# Patient Record
Sex: Female | Born: 1935 | Race: White | Hispanic: No | Smoking: Never smoker
Health system: Southern US, Community
[De-identification: ages and names within clinical notes are randomized; demographics above are authoritative.]

## PROBLEM LIST (undated history)

## (undated) DIAGNOSIS — G4700 Insomnia, unspecified: Secondary | ICD-10-CM

## (undated) DIAGNOSIS — R42 Dizziness and giddiness: Secondary | ICD-10-CM

## (undated) DIAGNOSIS — Z8619 Personal history of other infectious and parasitic diseases: Secondary | ICD-10-CM

## (undated) DIAGNOSIS — F418 Other specified anxiety disorders: Secondary | ICD-10-CM

## (undated) DIAGNOSIS — N200 Calculus of kidney: Secondary | ICD-10-CM

## (undated) DIAGNOSIS — N189 Chronic kidney disease, unspecified: Secondary | ICD-10-CM

## (undated) DIAGNOSIS — R0600 Dyspnea, unspecified: Secondary | ICD-10-CM

## (undated) DIAGNOSIS — I700 Atherosclerosis of aorta: Secondary | ICD-10-CM

## (undated) DIAGNOSIS — I639 Cerebral infarction, unspecified: Secondary | ICD-10-CM

## (undated) DIAGNOSIS — R110 Nausea: Principal | ICD-10-CM

## (undated) DIAGNOSIS — M4800 Spinal stenosis, site unspecified: Secondary | ICD-10-CM

## (undated) DIAGNOSIS — N6019 Diffuse cystic mastopathy of unspecified breast: Principal | ICD-10-CM

## (undated) DIAGNOSIS — R413 Other amnesia: Secondary | ICD-10-CM

## (undated) DIAGNOSIS — I10 Essential (primary) hypertension: Secondary | ICD-10-CM

## (undated) DIAGNOSIS — E079 Disorder of thyroid, unspecified: Secondary | ICD-10-CM

## (undated) DIAGNOSIS — R350 Frequency of micturition: Secondary | ICD-10-CM

## (undated) HISTORY — DX: Calculus of kidney: N20.0

## (undated) HISTORY — DX: Cerebral infarction, unspecified: I63.9

## (undated) HISTORY — DX: Chronic kidney disease, unspecified: N18.9

## (undated) HISTORY — DX: Other specified anxiety disorders: F41.8

## (undated) HISTORY — DX: Essential (primary) hypertension: I10

## (undated) HISTORY — DX: Nausea: R11.0

## (undated) HISTORY — DX: Frequency of micturition: R35.0

## (undated) HISTORY — DX: Insomnia, unspecified: G47.00

## (undated) HISTORY — DX: Disorder of thyroid, unspecified: E07.9

## (undated) HISTORY — DX: Personal history of other infectious and parasitic diseases: Z86.19

## (undated) HISTORY — DX: Dizziness and giddiness: R42

## (undated) HISTORY — DX: Other amnesia: R41.3

## (undated) HISTORY — DX: Spinal stenosis, site unspecified: M48.00

## (undated) HISTORY — DX: Diffuse cystic mastopathy of unspecified breast: N60.19

---

## 1961-05-30 HISTORY — PX: ABDOMINAL HYSTERECTOMY: SHX81

## 1968-05-30 HISTORY — PX: BREAST SURGERY: SHX581

## 1987-05-31 HISTORY — PX: BACK SURGERY: SHX140

## 1996-05-30 DIAGNOSIS — N189 Chronic kidney disease, unspecified: Secondary | ICD-10-CM

## 1996-05-30 HISTORY — DX: Chronic kidney disease, unspecified: N18.9

## 2007-04-30 DIAGNOSIS — I639 Cerebral infarction, unspecified: Secondary | ICD-10-CM

## 2007-04-30 HISTORY — DX: Cerebral infarction, unspecified: I63.9

## 2007-09-18 ENCOUNTER — Encounter: Admission: RE | Admit: 2007-09-18 | Discharge: 2007-09-29 | Payer: Self-pay | Admitting: Neurology

## 2007-09-30 ENCOUNTER — Encounter: Admission: RE | Admit: 2007-09-30 | Discharge: 2007-12-29 | Payer: Self-pay | Admitting: Neurology

## 2007-12-09 ENCOUNTER — Inpatient Hospital Stay (HOSPITAL_COMMUNITY): Admission: EM | Admit: 2007-12-09 | Discharge: 2007-12-11 | Payer: Self-pay | Admitting: Emergency Medicine

## 2007-12-17 ENCOUNTER — Emergency Department (HOSPITAL_COMMUNITY): Admission: EM | Admit: 2007-12-17 | Discharge: 2007-12-17 | Payer: Self-pay | Admitting: Emergency Medicine

## 2008-06-11 ENCOUNTER — Encounter: Admission: RE | Admit: 2008-06-11 | Discharge: 2008-07-03 | Payer: Self-pay | Admitting: Neurosurgery

## 2008-10-14 ENCOUNTER — Emergency Department (HOSPITAL_COMMUNITY): Admission: EM | Admit: 2008-10-14 | Discharge: 2008-10-14 | Payer: Self-pay | Admitting: Emergency Medicine

## 2009-10-06 ENCOUNTER — Emergency Department (HOSPITAL_COMMUNITY): Admission: EM | Admit: 2009-10-06 | Discharge: 2009-10-06 | Payer: Self-pay | Admitting: Emergency Medicine

## 2010-09-07 LAB — POCT I-STAT, CHEM 8
Chloride: 100 mEq/L (ref 96–112)
Creatinine, Ser: 0.9 mg/dL (ref 0.4–1.2)
Glucose, Bld: 103 mg/dL — ABNORMAL HIGH (ref 70–99)
HCT: 41 % (ref 36.0–46.0)
Potassium: 3.4 mEq/L — ABNORMAL LOW (ref 3.5–5.1)

## 2010-09-07 LAB — URINALYSIS, ROUTINE W REFLEX MICROSCOPIC
Bilirubin Urine: NEGATIVE
Hgb urine dipstick: NEGATIVE
Specific Gravity, Urine: 1.015 (ref 1.005–1.030)
pH: 8 (ref 5.0–8.0)

## 2010-09-07 LAB — CBC
RBC: 4.03 MIL/uL (ref 3.87–5.11)
WBC: 6.1 10*3/uL (ref 4.0–10.5)

## 2010-09-07 LAB — DIFFERENTIAL
Lymphs Abs: 1.4 10*3/uL (ref 0.7–4.0)
Monocytes Relative: 7 % (ref 3–12)
Neutro Abs: 4.3 10*3/uL (ref 1.7–7.7)
Neutrophils Relative %: 70 % (ref 43–77)

## 2010-09-07 LAB — URINE MICROSCOPIC-ADD ON

## 2010-10-12 NOTE — H&P (Signed)
NAMEKISMET, FACEMIRE NO.:  [id number]   MEDICAL RECORD NO.:  [id number]          PATIENT TYPE:  INP   LOCATION:  3036                         FACILITY:  [facility]   PHYSICIAN:  Darnelle Bos, MDDATE OF BIRTH:  August 13, 1926   DATE OF ADMISSION:  12/09/2007  DATE OF DISCHARGE:                              HISTORY & PHYSICAL   REASONS FOR ADMISSION:  Vertigo and generalized weakness.   HISTORY OF PRESENT ILLNESS:  Ms. Goldberger is a 77 year old right-handed  female who came into the hospital as a code stroke.  The code stroke was  activated at 8:41 a.m. on December 09, 2007 with a report of unsteady gait  and leg weakness that started at 7 a.m., but on further questioning the  patient reported last normalcy at 5 a.m.   The patient reports that she woke up at 5 a.m. to take her  antihypertensive and thyroid medications and went back to bed.  In  between 6:30 and 7 when she woke up to get ready for church, she felt  dizziness that generalized to weakness and heaviness in the arms and  tingling in the lower extremities.  She went back to bed.  She also  reports nausea without any vomiting.  She denies any headache or visual  changes.  Denies any facial numbness or paresthesias.  Denies any  difficulty with speech, chewing, or swallowing.  She denies any bowel or  bladder problems.  Denies any chest pain or shortness of breath.  Since  she was not feeling well, she called her son who asked her to call 911.  By the EMS arrived to evaluate her, they thought she had bilateral arm  drift without any facial droop or speech difficulties.  Reportedly, the  legs were okay other than some paresthesias.  Her blood pressure was  160/90 and her blood glucose was in the range of the 90s.  She was  brought into the emergency room at which time her symptoms totally  resolved.   Code stroke was cancelled as the patient did not have any focal weakness  and was just having vertigo.   By the time neurology evaluated the patient and said she had some mild  paresthesias in the feet and was lightheaded without any vertigo or any  weakness.   On further questioning, the patient reports that she had similar episode  in December 2008, but little increase in severity where she thought her  right side was weaker.  She was not evaluated immediately but went to  her physician in a couple of days.  She was evaluated by neurology and  Dr. Vickey Huger with all the testing as per report including MRI, cardiac  evaluation, and carotid studies which were reportedly normal.  These  were done at an outside place and we do not have access to these through  the hospital records at this time.   PAST MEDICAL HISTORY:  1. Hypertension.  2. Hypothyroidism.  3. Prior stroke.   PAST SURGICAL HISTORY:  Nonsignificant.   SOCIAL HISTORY:  The patient lives by herself and recently moved in  October 2008 from [place].  She does not smoke.  She drinks a  glass of wine every night as per physician recommendation.  She denies  any illicit drugs.  She walks by herself at the home but uses a cane  when she goes outside.   FAMILY HISTORY:  No history of CVA.  No other significant family history  of diabetes or hypertension or coronary artery disease.   REVIEW OF SYSTEMS:  All systems were reviewed.  The pertinent positives  are mentioned in history and physical.  She denies any recent  infections.  Denies any recent change in appetite or weight.  She denies  any recent trauma.   PHYSICAL EXAMINATION:  VITAL SIGNS:  Her blood pressure was 160/90, her  pulse rate was 70, respirations 16, she was afebrile.  Saturating 95%-  100% on 2 L.  GENERAL:  Ms. Wampole is a pleasant female in no acute distress.  HEENT:  Normocephalic, atraumatic.  NECK:  Supple, no carotid bruits, no thyromegaly.  CARDIOVASCULAR:  Regular rhythmic rate.  LUNGS:  Clear to air entry.  EXTREMITIES:  No cyanosis,  clubbing, or edema.  NEUROLOGIC:  The patient is awake, alert, oriented to person, place and  time.  No dysarthria or aphasia.  She could read and name and repeat  without any problems.  CRANIAL NERVES:  II-XII were intact.  Pupils were symmetric, reactive to  light and accommodation.  Extraocular movements were intact other than  mild end-gaze nystagmus.  Visual fields were intact to confrontation.  No neglect.  No facial asymmetry.  Tongue was midline without any  weakness or atrophy.  Palate elevation was symmetric.  MOTOR EVALUATION:  Normal bulk and tone for age.  Strength was greater  than 5/5 proximally and distally, other than shakiness while evaluation.  Deep tendon reflexes were 2+ and symmetric.  Plantars were downgoing.  Sensory evaluation reveals normal pinprick, position, and touch even to  simultaneous stimulations.  Vibration sensation was absent at the toes  with mildly reduced to the ankles and normal in the upper extremities.  Normal finger-nose testing and heel testing.  She appeared slightly slow  in rapid alternating movements bilaterally.  Gait was not evaluated at  this time.   LABORATORY EVALUATIONS:  Her CBC was normal with a white count of 6.6,  hemoglobin 14.6, hematocrit 44.1, and platelets 239.  Her coag profile  and cardiac evaluation is pending.  EKG shows some nonspecific ST  changes, but is in sinus rhythm.  A CT of the head was reviewed.  There  were no acute abnormalities, other than atrophy and some periventricular  white matter changes.   IMPRESSION:  Transient vertigo with generalized weakness.  At this time,  this is possibly a peripheral etiology but cannot rule out a central  vertigo.  Her symptoms are totally resolved and her NIH stroke scale  currently is 0.  Her CT of the head is negative.  Due to her similar  episode in December with some right-sided weakness and the repeat  episode now, the plan is:  1. Admit her for observation under  telemetry.  2. We will get records of all the recent evaluations as reports      getting whole stroke workup done before we order any testing like      MRI or MRA.  3. We will hydrate her and get physical therapy to evaluate her.  4. We will monitor blood pressure to maintain it less than  180/100 and      use p.r.n. labetalol.  5. Low-salt, low-cholesterol diet after swallow evaluation.  6. We will get cardiac enzymes and do them every 6 hours.  7. We will get thyroid profile, hemoglobin A1c, homocysteine, and      fasting lipid profile and coag studies.  8. We will check her UA.  9. We will hydrate her with 80 mL of normal saline.   The patient was explained the plan and is in agreement with it.  We will  have records from Dr. Oliva Bustard office on Monday morning before  finalizing any further plans.      Darnelle Bos, MD  Electronically Signed     RB/MEDQ  D:  12/09/2007  T:  12/09/2007  Job:  [id number]

## 2010-10-12 NOTE — Discharge Summary (Signed)
NAME:  Darlene Wilson, Darlene Wilson NO.:  [id number]   MEDICAL RECORD NO.:  [id number]          PATIENT TYPE:  INP   LOCATION:  3036                         FACILITY:  [facility]   PHYSICIAN:  Pramod P. Pearlean Brownie, MD    DATE OF BIRTH:  24-Jun-1926   DATE OF ADMISSION:  12/09/2007  DATE OF DISCHARGE:  12/11/2007                               DISCHARGE SUMMARY   DISCHARGE DIAGNOSES:  1. Brainstem Transient ischemic attack.  2. Hypertension.  3. Hyperthyroidism.  4. History of previous stroke.   DISCHARGE MEDICINES:  1. Hydrochlorothiazide 12.5 mg a day.  2. Lisinopril 10 mg a day.  3. Aspirin 81 mg q.a.m. x2 weeks, then stop.  4. Synthroid 75 mcg a day.  5. Vitamin C 1000 mg a day.  6. Omega-3 fish oil a day.  7. B12 1000 mg a day.  8. Beta-carotene 10,000 units a day.  9. Aggrenox 1 nightly, x2 weeks then increased to b.i.d.   STUDIES PERFORMED:  1. CT of the brain on admission shows atrophy and chronic      microvascular ischemia.  No acute abnormality.  2. MRI of the brain shows no acute stroke.  3. MRA of the brain shows no significant stenosis.  4. Carotid Doppler, not ordered.  5. Transcranial Doppler, not ordered.  6. 2-D echocardiogram, not ordered.  7. EKG, not present in chart.   LABORATORY STUDIES:  CBC normal.  Chemistry with glucose 193, otherwise  normal coags; normal liver function tests; and normal albumin 3.4.  Cardiac enzymes negative x2.  Cholesterol 109, triglycerides 61, HDL 33,  and LDL 64.  Urinalysis is negative.  Homocystine is 8.4, TSH is 1.801,  and hemoglobin A1c is 5.5.   HISTORY OF PRESENT ILLNESS:  Darlene Wilson is a 77 year old right-  handed Caucasian female who came to the hospital as a code stroke.  She  reported unsteady gait and leg weakness that started at 7 a.m., but on  further questioning, the patient reported last normal at 5 a.m.  The  patient reports she woke at 5 a.m. to take her anti-hypertensive and  thyroid medications and  went back to bed.  Between 6:30 and 7, when she  woke up to get ready for church, she felt dizzy with generalized  weakness and heaviness in the arms and tingling in the lower  extremities.  She went back to bed.  She also reports nausea without  vomiting.  She denies headache or visual changes, numbness or  paresthesias, any difficulty with speech, chewing, or swallowing.  She  denies bowel or bladder problems.  She states she was not feeling well.  She called her son who asked her to call 911.  As time EMS arrived to  evaluate her, they thought she had bilateral arm drift without any  facial droop or speech difficulties.  Reportedly, the legs were okay  other than some paresthesias.  A blood pressure was 160/90 and her CBG  was 90.  She was brought to the emergency room where her symptoms  resolved.  The code stroke was cancelled as she  did not have any focal  neurologic deficits.  She was not a TPA candidate secondary to  resolution.   The patient reports, she had similar episode in December 2008, but  little increase in severity where she thought her right side was weaker.  She was not evaluated immediately, but went to her physician in a couple  of days.  She was evaluated by neurology at that time and all the  testing was normal.   HOSPITAL COURSE:  MRI again was negative for acute stroke.  I felt the  patient most likely had a brainstem TIA.  As she had a brainstem TIA on  aspirin, she was changed to Aggrenox.  We will bridge with aspirin for 2  weeks and then go to Aggrenox b.i.d. for headache prevention.  In the  hospital, she was evaluated by PT and OT and felt she would benefit from  a home safety eval and that has been ordered for followup.  Otherwise,  her risk factors are stable.  She has been asked to follow up with Dr.  Pearlean Brownie in 2 months and will consider the IRS trial at that time.   CONDITION ON DISCHARGE:  Alter and oriented x3.  No aphasia.  No  dysarthria.   Cranial nerves were normal and she has no focal neurologic  deficits.   DISCHARGE PLAN:  1. Discharge home with family.  2. Followup with Dr. Delia Heady in 2 months.  3. Followup with the primary care physician in 1 month.  May ask him      about the shingles shot.  4. Consider IRS trial.  5. Aggrenox for stroke prevention.  6. Home Health PT and OT safety evaluation along with rolling walker      with 5-inch wheel for home use.      Annie Main, N.P.    ______________________________  Sunny Schlein. Pearlean Brownie, MD    SB/MEDQ  D:  12/11/2007  T:  12/12/2007  Job:  [id number]   cc:   Pramod P. Pearlean Brownie, MD  [facility]

## 2011-01-12 ENCOUNTER — Encounter: Payer: Self-pay | Admitting: Family Medicine

## 2011-01-12 ENCOUNTER — Ambulatory Visit (INDEPENDENT_AMBULATORY_CARE_PROVIDER_SITE_OTHER): Payer: Medicare Other | Admitting: Family Medicine

## 2011-01-12 DIAGNOSIS — N189 Chronic kidney disease, unspecified: Secondary | ICD-10-CM

## 2011-01-12 DIAGNOSIS — I6350 Cerebral infarction due to unspecified occlusion or stenosis of unspecified cerebral artery: Secondary | ICD-10-CM

## 2011-01-12 DIAGNOSIS — E079 Disorder of thyroid, unspecified: Secondary | ICD-10-CM | POA: Insufficient documentation

## 2011-01-12 DIAGNOSIS — N200 Calculus of kidney: Secondary | ICD-10-CM | POA: Insufficient documentation

## 2011-01-12 DIAGNOSIS — I10 Essential (primary) hypertension: Secondary | ICD-10-CM | POA: Insufficient documentation

## 2011-01-12 DIAGNOSIS — G4700 Insomnia, unspecified: Secondary | ICD-10-CM

## 2011-01-12 DIAGNOSIS — I639 Cerebral infarction, unspecified: Secondary | ICD-10-CM

## 2011-01-12 DIAGNOSIS — E039 Hypothyroidism, unspecified: Secondary | ICD-10-CM

## 2011-01-12 DIAGNOSIS — N6019 Diffuse cystic mastopathy of unspecified breast: Secondary | ICD-10-CM

## 2011-01-12 HISTORY — DX: Diffuse cystic mastopathy of unspecified breast: N60.19

## 2011-01-12 MED ORDER — HYDROCHLOROTHIAZIDE 12.5 MG PO CAPS: 12.5000 mg | ORAL_CAPSULE | ORAL | Status: DC

## 2011-01-12 MED ORDER — LEVOTHYROXINE SODIUM 75 MCG PO TABS: 75.0000 ug | ORAL_TABLET | Freq: Every day | ORAL | Status: DC

## 2011-01-12 NOTE — Patient Instructions (Signed)

## 2011-01-13 ENCOUNTER — Encounter: Payer: Self-pay | Admitting: Family Medicine

## 2011-01-13 DIAGNOSIS — G4700 Insomnia, unspecified: Secondary | ICD-10-CM

## 2011-01-13 HISTORY — DX: Insomnia, unspecified: G47.00

## 2011-01-13 NOTE — Assessment & Plan Note (Signed)
Patient denies any concerns at today's visit and continues with annual [facility], believes her next one is due in September but they cannot remember where she gets hers, they will make sure we get a copy when they schedule her appt. They have info at home

## 2011-01-13 NOTE — Assessment & Plan Note (Signed)
Well treated no change to Levothyroxine dose, refill given today

## 2011-01-13 NOTE — Assessment & Plan Note (Signed)
She has just seen her neurologist and been started on Gabapentin, takes the first dose tonight for some right sided temporal pains they think are neuropathic, they are intermittent, hit suddenly, are electrical and stabbing. Tonight she will start 100mg  qhs and already knows to watch out for SE and call neuro if any concerns.

## 2011-01-13 NOTE — Assessment & Plan Note (Signed)
No recent kidney stones, maintain adequate hydration and check a renal panel at next visit

## 2011-01-13 NOTE — Assessment & Plan Note (Signed)
Patient has a long history of trouble maintain ing sleep. She is up and down all night. Gabapentin may be helpful in this regard, we will have her back to reassess next month after she has her annual blood work done. Likely this is a major contributing factor to her fatigue as well.

## 2011-01-13 NOTE — Assessment & Plan Note (Signed)
Well controlled on current low dose of HCTZ, given refill today and old records are requested to assess time line of disease

## 2011-01-13 NOTE — Progress Notes (Signed)
Darlene Wilson [id number] 1927/04/09 01/13/2011      Progress Note New Patient  Subjective  Chief Complaint  Chief Complaint  Patient presents with  . Establish Care    TSH check    HPI  Patient is a 77 yo Caucasian female in today to establish care. She is accompanied by her daughter-in-law and has a history stroke, HTN, insomnia and is in need of a new PMD. She has  Along history of insomnia plagues her but she has just been to see her Neurologist and they have given her a prescription for Gabapentin to help with some right sided HA that have been occuring since her strokes 4 years ago. They are stabbing and they believe it is nerve damage. She is due to start the Gabapentin 100mg  tonight. No recent acute illness, fevers, chills, CP, palp, SOB, GI or GU c/o.    Past Medical History  Diagnosis Date  . History of chicken pox   . Glaucoma     both eyes  . Hypertension   . Chronic kidney disease 1998    stones  . Stroke 04/2007    x 3 total  . Thyroid disease     hypothyroid  . Fibrocystic breast 01/12/2011  . Insomnia 01/13/2011    Past Surgical History  Procedure Date  . Abdominal hysterectomy 1963  . Back surgery 1989    discectomy L4-5 1989, good results  . Breast surgery 1970    biopsy, benign, b/l    Family History  Problem Relation Age of Onset  . Heart disease Mother   . Hypertension Mother   . Diabetes Mother   . Heart disease Father   . Hypertension Father   . Cancer Sister     breast cancer    History   Social History  . Marital Status: Widowed    Spouse Name: N/A    Number of Children: 2  . Years of Education: N/A   Occupational History  . Not on file.   Social History Main Topics  . Smoking status: Never Smoker   . Smokeless tobacco: Never Used  . Alcohol Use: 1.8 oz/week    3 Glasses of wine per week  . Drug Use: No  . Sexually Active: Not Currently   Other Topics Concern  . Not on file   Social History Narrative  . No narrative  on file    No current outpatient prescriptions on file prior to visit.    Allergies  Allergen Reactions  . Ciprofloxacin Hives    Given with 2 other meds/unsure if truly allergic  . Combigan (Brimonidine Tartrate-Timolol) Other (See Comments)    Inflamation  . Detrol (Tolterodine Tartrate) Hives    Given with 2 other medications/unsure if truly allergic  . Oxycodone Hives    Given with 2 other medications/unsure if truly allergic  . Penicillins   . Sulfa Antibiotics     Review of Systems  Review of Systems  Constitutional: Positive for malaise/fatigue. Negative for fever and chills.  HENT: Negative for hearing loss, nosebleeds and congestion.   Eyes: Negative for discharge.  Respiratory: Negative for cough, sputum production, shortness of breath and wheezing.   Cardiovascular: Negative for chest pain, palpitations and leg swelling.  Gastrointestinal: Negative for heartburn, nausea, vomiting, abdominal pain, diarrhea, constipation and blood in stool.  Genitourinary: Negative for dysuria, urgency, frequency and hematuria.  Musculoskeletal: Negative for myalgias, back pain and falls.  Skin: Negative for rash.  Neurological: Positive for focal weakness  and headaches. Negative for dizziness, tremors, sensory change, loss of consciousness and weakness.       Right lower extremity  Endo/Heme/Allergies: Negative for polydipsia. Does not bruise/bleed easily.  Psychiatric/Behavioral: Negative for depression and suicidal ideas. The patient is not nervous/anxious and does not have insomnia.     Objective  BP 128/78  Pulse 71  Temp(Src) 98.3 F (36.8 C) (Oral)  Wt 130 lb (58.968 kg)  SpO2 97%  Physical Exam  Physical Exam  Constitutional: She is oriented to person, place, and time and well-developed, well-nourished, and in no distress. No distress.  HENT:  Head: Normocephalic and atraumatic.  Right Ear: External ear normal.  Left Ear: External ear normal.  Nose: Nose normal.   Mouth/Throat: Oropharynx is clear and moist. No oropharyngeal exudate.  Eyes: Conjunctivae are normal. Pupils are equal, round, and reactive to light. Right eye exhibits no discharge. Left eye exhibits no discharge. No scleral icterus.  Neck: Normal range of motion. Neck supple. No thyromegaly present.  Cardiovascular: Normal rate, regular rhythm, normal heart sounds and intact distal pulses.   No murmur heard. Pulmonary/Chest: Effort normal and breath sounds normal. No respiratory distress. She has no wheezes. She has no rales.  Abdominal: Soft. Bowel sounds are normal. She exhibits no distension and no mass. There is no tenderness.  Musculoskeletal: Normal range of motion. She exhibits no edema and no tenderness.  Lymphadenopathy:    She has no cervical adenopathy.  Neurological: She is alert and oriented to person, place, and time. She has normal reflexes. No cranial nerve deficit. Coordination abnormal.       4+/5 strength in rle, 5/5 of strength lle  Skin: Skin is warm and dry. No rash noted. She is not diaphoretic.  Psychiatric: Mood, memory and affect normal.       Assessment & Plan  Stroke She has just seen her neurologist and been started on Gabapentin, takes the first dose tonight for some right sided temporal pains they think are neuropathic, they are intermittent, hit suddenly, are electrical and stabbing. Tonight she will start 100mg  qhs and already knows to watch out for SE and call neuro if any concerns.  Fibrocystic breast Patient denies any concerns at today's visit and continues with annual [facility], believes her next one is due in September but they cannot remember where she gets hers, they will make sure we get a copy when they schedule her appt. They have info at home  Hypertension Well controlled on current low dose of HCTZ, given refill today and old records are requested to assess time line of disease  Thyroid disease Well treated no change to Levothyroxine dose,  refill given today  Chronic kidney disease No recent kidney stones, maintain adequate hydration and check a renal panel at next visit  Insomnia Patient has a long history of trouble maintain ing sleep. She is up and down all night. Gabapentin may be helpful in this regard, we will have her back to reassess next month after she has her annual blood work done. Likely this is a major contributing factor to her fatigue as well.

## 2011-01-16 ENCOUNTER — Encounter: Payer: Self-pay | Admitting: Family Medicine

## 2011-01-30 ENCOUNTER — Emergency Department (HOSPITAL_COMMUNITY): Payer: Medicare Other

## 2011-01-30 ENCOUNTER — Observation Stay (HOSPITAL_COMMUNITY)
Admission: EM | Admit: 2011-01-30 | Discharge: 2011-02-01 | Disposition: A | Discharge status: Home / Self Care | Payer: Medicare Other | Attending: Internal Medicine | Admitting: Internal Medicine

## 2011-01-30 DIAGNOSIS — R5381 Other malaise: Secondary | ICD-10-CM | POA: Insufficient documentation

## 2011-01-30 DIAGNOSIS — R51 Headache: Secondary | ICD-10-CM | POA: Insufficient documentation

## 2011-01-30 DIAGNOSIS — I059 Rheumatic mitral valve disease, unspecified: Secondary | ICD-10-CM | POA: Insufficient documentation

## 2011-01-30 DIAGNOSIS — Z79899 Other long term (current) drug therapy: Secondary | ICD-10-CM | POA: Insufficient documentation

## 2011-01-30 DIAGNOSIS — I10 Essential (primary) hypertension: Secondary | ICD-10-CM | POA: Insufficient documentation

## 2011-01-30 DIAGNOSIS — E876 Hypokalemia: Secondary | ICD-10-CM | POA: Insufficient documentation

## 2011-01-30 DIAGNOSIS — R55 Syncope and collapse: Secondary | ICD-10-CM | POA: Insufficient documentation

## 2011-01-30 DIAGNOSIS — G459 Transient cerebral ischemic attack, unspecified: Principal | ICD-10-CM | POA: Insufficient documentation

## 2011-01-30 DIAGNOSIS — Z8673 Personal history of transient ischemic attack (TIA), and cerebral infarction without residual deficits: Secondary | ICD-10-CM | POA: Insufficient documentation

## 2011-01-30 DIAGNOSIS — R42 Dizziness and giddiness: Secondary | ICD-10-CM | POA: Insufficient documentation

## 2011-01-30 DIAGNOSIS — E871 Hypo-osmolality and hyponatremia: Secondary | ICD-10-CM | POA: Insufficient documentation

## 2011-01-30 DIAGNOSIS — F411 Generalized anxiety disorder: Secondary | ICD-10-CM | POA: Insufficient documentation

## 2011-01-30 DIAGNOSIS — R072 Precordial pain: Secondary | ICD-10-CM | POA: Insufficient documentation

## 2011-01-30 DIAGNOSIS — F29 Unspecified psychosis not due to a substance or known physiological condition: Secondary | ICD-10-CM | POA: Insufficient documentation

## 2011-01-30 DIAGNOSIS — E039 Hypothyroidism, unspecified: Secondary | ICD-10-CM | POA: Insufficient documentation

## 2011-01-30 DIAGNOSIS — I679 Cerebrovascular disease, unspecified: Secondary | ICD-10-CM | POA: Insufficient documentation

## 2011-01-30 DIAGNOSIS — I7389 Other specified peripheral vascular diseases: Secondary | ICD-10-CM | POA: Insufficient documentation

## 2011-01-30 DIAGNOSIS — H538 Other visual disturbances: Secondary | ICD-10-CM | POA: Insufficient documentation

## 2011-01-30 LAB — COMPREHENSIVE METABOLIC PANEL
AST: 16 U/L (ref 0–37)
Albumin: 3.3 g/dL — ABNORMAL LOW (ref 3.5–5.2)
Albumin: 3.3 g/dL — ABNORMAL LOW (ref 3.5–5.2)
Alkaline Phosphatase: 83 U/L (ref 39–117)
BUN: 14 mg/dL (ref 6–23)
BUN: 11 mg/dL (ref 6–23)
Calcium: 9.3 mg/dL (ref 8.4–10.5)
Chloride: 96 mEq/L (ref 96–112)
Creatinine, Ser: 0.71 mg/dL (ref 0.50–1.10)
Potassium: 3 mEq/L — ABNORMAL LOW (ref 3.5–5.1)
Total Bilirubin: 0.4 mg/dL (ref 0.3–1.2)
Total Protein: 6.2 g/dL (ref 6.0–8.3)

## 2011-01-30 LAB — MAGNESIUM: Magnesium: 2.1 mg/dL (ref 1.5–2.5)

## 2011-01-30 LAB — URINALYSIS, ROUTINE W REFLEX MICROSCOPIC
Bilirubin Urine: NEGATIVE
Leukocytes, UA: NEGATIVE
Nitrite: POSITIVE — AB
Specific Gravity, Urine: 1.014 (ref 1.005–1.030)
pH: 7.5 (ref 5.0–8.0)

## 2011-01-30 LAB — DIFFERENTIAL
Basophils Absolute: 0 10*3/uL (ref 0.0–0.1)
Eosinophils Absolute: 0 10*3/uL (ref 0.0–0.7)
Lymphocytes Relative: 21 % (ref 12–46)
Lymphs Abs: 1.3 10*3/uL (ref 0.7–4.0)
Neutrophils Relative %: 67 % (ref 43–77)

## 2011-01-30 LAB — CBC
HCT: 35.3 % — ABNORMAL LOW (ref 36.0–46.0)
Hemoglobin: 11.4 g/dL — ABNORMAL LOW (ref 12.0–15.0)
MCV: 84.6 fL (ref 78.0–100.0)
Platelets: 301 10*3/uL (ref 150–400)
RBC: 4.21 MIL/uL (ref 3.87–5.11)
RBC: 4.17 MIL/uL (ref 3.87–5.11)
RDW: 14.5 % (ref 11.5–15.5)
WBC: 6.1 10*3/uL (ref 4.0–10.5)
WBC: 6.9 10*3/uL (ref 4.0–10.5)

## 2011-01-30 LAB — POCT I-STAT TROPONIN I: Troponin i, poc: 0 ng/mL (ref 0.00–0.08)

## 2011-01-30 LAB — URINE MICROSCOPIC-ADD ON

## 2011-01-30 LAB — CARDIAC PANEL(CRET KIN+CKTOT+MB+TROPI): Relative Index: INVALID (ref 0.0–2.5)

## 2011-01-30 LAB — APTT: aPTT: 29 seconds (ref 24–37)

## 2011-01-31 ENCOUNTER — Observation Stay (HOSPITAL_COMMUNITY): Payer: Medicare Other

## 2011-01-31 LAB — LIPID PANEL: HDL: 46 mg/dL (ref >39)

## 2011-01-31 LAB — MAGNESIUM: Magnesium: 2.3 mg/dL (ref 1.5–2.5)

## 2011-01-31 LAB — CARDIAC PANEL(CRET KIN+CKTOT+MB+TROPI)
CK, MB: 2.1 ng/mL (ref 0.3–4.0)
Relative Index: INVALID (ref 0.0–2.5)
Total CK: 47 U/L (ref 7–177)
Troponin I: <0.3 ng/mL (ref <0.30)
Troponin I: <0.3 ng/mL (ref <0.30)

## 2011-01-31 LAB — BASIC METABOLIC PANEL
CO2: 26 mEq/L (ref 19–32)
Chloride: 103 mEq/L (ref 96–112)
Sodium: 136 mEq/L (ref 135–145)

## 2011-01-31 LAB — HOMOCYSTEINE: Homocysteine: 7.8 umol/L (ref 4.0–15.4)

## 2011-01-31 LAB — TSH: TSH: 1.175 u[IU]/mL (ref 0.350–4.500)

## 2011-01-31 MED ORDER — GADOBENATE DIMEGLUMINE 529 MG/ML IV SOLN
15.0000 mL | Freq: Once | INTRAVENOUS | Status: AC | PRN
  Administered 2011-01-31: 15 mL via INTRAVENOUS

## 2011-02-01 DIAGNOSIS — R42 Dizziness and giddiness: Secondary | ICD-10-CM

## 2011-02-03 LAB — URINE CULTURE: Colony Count: >=100000

## 2011-02-04 ENCOUNTER — Telehealth: Payer: Self-pay | Admitting: Family Medicine

## 2011-02-04 DIAGNOSIS — R29898 Other symptoms and signs involving the musculoskeletal system: Secondary | ICD-10-CM

## 2011-02-04 DIAGNOSIS — I639 Cerebral infarction, unspecified: Secondary | ICD-10-CM

## 2011-02-04 MED ORDER — BATH/SHOWER SEAT MISC: 1.0000 | Status: DC | PRN

## 2011-02-04 NOTE — Telephone Encounter (Signed)
OK, please print an rx and I will sign for debility

## 2011-02-04 NOTE — Telephone Encounter (Signed)
Please advise 

## 2011-02-04 NOTE — Telephone Encounter (Signed)
Britta Mccreedy from [facility] called requesting an order for a shower chair for Darlene Wilson.Please fax order to [phone].

## 2011-02-04 NOTE — Telephone Encounter (Signed)
faxed

## 2011-02-08 ENCOUNTER — Telehealth: Payer: Self-pay | Admitting: Family Medicine

## 2011-02-08 NOTE — Telephone Encounter (Signed)
Darlene Wilson from [facility] has received a request from the patient to send someone to her house for PT. The patient is having trouble with her right leg turning out when she walks. Can you put in a referral?

## 2011-02-09 ENCOUNTER — Ambulatory Visit (INDEPENDENT_AMBULATORY_CARE_PROVIDER_SITE_OTHER): Payer: Medicare Other | Admitting: Family Medicine

## 2011-02-09 ENCOUNTER — Encounter: Payer: Self-pay | Admitting: Family Medicine

## 2011-02-09 VITALS — BP 111/71 | HR 76 | Temp 97.9°F | Ht 62.75 in | Wt 128.4 lb

## 2011-02-09 DIAGNOSIS — I639 Cerebral infarction, unspecified: Secondary | ICD-10-CM

## 2011-02-09 DIAGNOSIS — R42 Dizziness and giddiness: Secondary | ICD-10-CM

## 2011-02-09 DIAGNOSIS — Z23 Encounter for immunization: Secondary | ICD-10-CM

## 2011-02-09 DIAGNOSIS — I679 Cerebrovascular disease, unspecified: Secondary | ICD-10-CM

## 2011-02-09 DIAGNOSIS — R350 Frequency of micturition: Secondary | ICD-10-CM

## 2011-02-09 DIAGNOSIS — R269 Unspecified abnormalities of gait and mobility: Secondary | ICD-10-CM

## 2011-02-09 DIAGNOSIS — I10 Essential (primary) hypertension: Secondary | ICD-10-CM

## 2011-02-09 DIAGNOSIS — R2681 Unsteadiness on feet: Secondary | ICD-10-CM

## 2011-02-09 DIAGNOSIS — I6350 Cerebral infarction due to unspecified occlusion or stenosis of unspecified cerebral artery: Secondary | ICD-10-CM

## 2011-02-09 HISTORY — DX: Frequency of micturition: R35.0

## 2011-02-09 HISTORY — DX: Dizziness and giddiness: R42

## 2011-02-09 LAB — RENAL FUNCTION PANEL
Albumin: 4.2 g/dL (ref 3.5–5.2)
BUN: 19 mg/dL (ref 6–23)
CO2: 27 mEq/L (ref 19–32)
Creatinine, Ser: 1 mg/dL (ref 0.4–1.2)
GFR: 55.76 mL/min — ABNORMAL LOW (ref >60.00)

## 2011-02-09 LAB — CBC
HCT: 39.9 % (ref 36.0–46.0)
MCH: 27.7 pg (ref 26.0–34.0)
MCHC: 31.6 g/dL (ref 30.0–36.0)
RDW: 15.9 % — ABNORMAL HIGH (ref 11.5–15.5)

## 2011-02-09 LAB — POCT URINALYSIS DIPSTICK
Ketones, UA: NEGATIVE
Nitrite, UA: POSITIVE
Protein, UA: NEGATIVE
pH, UA: 5.5

## 2011-02-09 LAB — HEPATIC FUNCTION PANEL
ALT: 13 U/L (ref 0–35)
Albumin: 4.2 g/dL (ref 3.5–5.2)
Total Bilirubin: 0.3 mg/dL (ref 0.3–1.2)
Total Protein: 7 g/dL (ref 6.0–8.3)

## 2011-02-09 NOTE — Progress Notes (Signed)
Darlene Wilson [id number] [birth year] 02/09/2011      Progress Note-Follow Up  Subjective  Chief Complaint  Chief Complaint  Patient presents with  . Follow-up    hospital follow up    HPI  Patient is here today for hospital followup. On September 2 she had an episode of feeling vertiginous for short period of time. She felt as if everything is spinning. She did not have a fall or nausea with an episode. It didn't seem to improve. Unfortunately recurred again the next day and was more severe and long lived. She fell onto her bed feeling vertiginous or vision the cough somewhat and she developed some confusion. She called EMS transport to the hospital. She was admitted for 2 days. Cardiac enzymes are negative EKG was normal. MR angiogram of the brain did not show any acute or distant stroke, clot or mass. The vertiginous episodes resolve and never recurred since then. No chest pains, recent illness, fevers, chills, new headaches, shortness of breath, palpitations, GI or GU complaints are noted. She denies any head trauma or any injuries. He did switch her from HCT to lisinopril in the hospital and she tolerated that well. I did give her a prescription for some Xanax to use when necessary but they had not filled it as far. Following up on her last appointment here he did start the Neurontin for her neurologic headaches and that has been very helpful. She has less headaches and they're less intense. She's not noting E. difficulty with the medication. After hospital admission she was encouraged to follow up with neurology and reports at this time she would prefer not to. She feels she is doing better. She had not made note of any significant urinary symptoms but on questioning she is noting some frequency. Labs from the hospital are reviewed and she is still in need of a TSH as well as a repeat renal panel and a urinalysis  Past Medical History  Diagnosis Date  . History of chicken pox   . Glaucoma       both eyes  . Hypertension   . Chronic kidney disease 1998    stones  . Stroke 04/2007    x 3 total  . Thyroid disease     hypothyroid  . Fibrocystic breast 01/12/2011  . Insomnia 01/13/2011  . Urinary frequency 02/09/2011  . Vertigo 02/09/2011    Past Surgical History  Procedure Date  . Abdominal hysterectomy 1963  . Back surgery 1989    discectomy L4-5 1989, good results  . Breast surgery 1970    biopsy, benign, b/l    Family History  Problem Relation Age of Onset  . Heart disease Mother   . Hypertension Mother   . Diabetes Mother   . Heart disease Father   . Hypertension Father   . Cancer Sister     breast cancer    History   Social History  . Marital Status: Widowed    Spouse Name: N/A    Number of Children: 2  . Years of Education: N/A   Occupational History  . Not on file.   Social History Main Topics  . Smoking status: Never Smoker   . Smokeless tobacco: Never Used  . Alcohol Use: 1.8 oz/week    3 Glasses of wine per week  . Drug Use: No  . Sexually Active: Not Currently   Other Topics Concern  . Not on file   Social History Narrative  . No narrative on  file    Current Outpatient Prescriptions on File Prior to Visit  Medication Sig Dispense Refill  . dipyridamole-aspirin (AGGRENOX) 25-200 MG per 12 hr capsule Take 1 capsule by mouth 2 (two) times daily.        Marland Kitchen gabapentin (NEURONTIN) 100 MG capsule Take 100 mg by mouth at bedtime.        Marland Kitchen latanoprost (XALATAN) 0.005 % ophthalmic solution Place 1 drop into both eyes at bedtime.        Marland Kitchen levothyroxine (SYNTHROID, LEVOTHROID) 75 MCG tablet Take 1 tablet (75 mcg total) by mouth daily.  90 tablet  1  . Misc. Devices (BATH/SHOWER SEAT) MISC 1 each by Does not apply route as needed.  1 each  0  . timolol (BETIMOL) 0.5 % ophthalmic solution Place 1 drop into both eyes every morning.          Allergies  Allergen Reactions  . Ciprofloxacin Hives    Given with 2 other meds/unsure if truly allergic   . Combigan (Brimonidine Tartrate-Timolol) Other (See Comments)    Inflamation  . Detrol (Tolterodine Tartrate) Hives    Given with 2 other medications/unsure if truly allergic  . Oxycodone Hives    Given with 2 other medications/unsure if truly allergic  . Penicillins   . Sulfa Antibiotics     Review of Systems  Review of Systems  Constitutional: Negative for fever, chills and malaise/fatigue.  HENT: Negative for congestion.   Eyes: Negative for discharge.  Respiratory: Negative for shortness of breath.   Cardiovascular: Negative for chest pain, palpitations and leg swelling.  Gastrointestinal: Negative for nausea, abdominal pain and diarrhea.  Genitourinary: Positive for frequency. Negative for dysuria, urgency, hematuria and flank pain.  Musculoskeletal: Negative for falls.  Skin: Negative for rash.  Neurological: Positive for dizziness and focal weakness. Negative for tremors, sensory change, speech change, seizures, loss of consciousness and headaches.       Focal weakness RLE stable. Dizziness resolved  Endo/Heme/Allergies: Negative for polydipsia.  Psychiatric/Behavioral: Negative for depression and suicidal ideas. The patient is not nervous/anxious and does not have insomnia.     Objective  BP 111/71  Pulse 76  Temp(Src) 97.9 F (36.6 C) (Oral)  Ht 5' 2.75" (1.594 m)  Wt 128 lb 6.4 oz (58.242 kg)  BMI 22.93 kg/m2  SpO2 97%  Physical Exam  Physical Exam  Constitutional: She is oriented to person, place, and time and well-developed, well-nourished, and in no distress. No distress.  HENT:  Head: Normocephalic and atraumatic.  Eyes: Conjunctivae are normal.  Neck: Neck supple. No thyromegaly present.  Cardiovascular: Normal rate, regular rhythm and normal heart sounds.   No murmur heard. Pulmonary/Chest: Effort normal and breath sounds normal. She has no wheezes.  Abdominal: She exhibits no distension and no mass.  Musculoskeletal: She exhibits no edema.   Lymphadenopathy:    She has no cervical adenopathy.  Neurological: She is alert and oriented to person, place, and time. She has normal reflexes. She displays normal reflexes. No cranial nerve deficit. She exhibits normal muscle tone. Coordination normal. GCS score is 15.  Skin: Skin is warm and dry. No rash noted. She is not diaphoretic.  Psychiatric: Memory, affect and judgment normal.    Lab Results  Component Value Date   TSH 1.175 01/30/2011   Lab Results  Component Value Date   WBC 6.9 01/30/2011   HGB 11.4* 01/30/2011   HCT 35.3* 01/30/2011   MCV 84.7 01/30/2011   PLT 314 01/30/2011  Lab Results  Component Value Date   CREATININE 0.62 01/31/2011   BUN 9 01/31/2011   NA 136 01/31/2011   K 3.7 01/31/2011   CL 103 01/31/2011   CO2 26 01/31/2011   Lab Results  Component Value Date   ALT 11 01/30/2011   AST 15 01/30/2011   ALKPHOS 83 01/30/2011   BILITOT 0.4 01/30/2011   Lab Results  Component Value Date   CHOL 148 01/31/2011   Lab Results  Component Value Date   HDL 46 01/31/2011   Lab Results  Component Value Date   LDLCALC 83 01/31/2011   Lab Results  Component Value Date   TRIG 93 01/31/2011   Lab Results  Component Value Date   CHOLHDL 3.2 01/31/2011     Assessment & Plan  Urinary frequency Notes some recent increased frequency lately. UA is questionable will send for culture to further evaluate  Stroke Was admitted to the hospital it was a long for 3 days from September 3 to September 5 with some questionable neurologic symptoms. She had some forgetfulness, some blurry vision and some vertigo MRI angiogram of the head and brain did not show any acute or major previous strokes, blood clots or anomalies. She was maintained on Aggrenox and has not had a recurrent episode. Labs and workup at the hospital were reviewed. Thyroid wasn't checked nor was a urinalysis we will run those today. She was advised to return or neural and at this point feels she does not want to. She is encouraged to  consider going back if she has any further symptoms. They did switch her from HCTZ to lisinopril and she is tolerating that well. She does continue to have some unsteady gait and her old right-sided weakness we will arrange for home PT eval. Former neurologic headaches on the right side we started Neurontin and she does feel like the headaches are significantly improved we will continue that at the present time  Vertigo When she was admitted to the hospital she does describe classic spinning sensation. It happened on the second part resolved then happened again on the third she fell onto her bed decided to call EMS and still had vertigo when she presented to the hospital. Since then it has resolved and has not recurred. It is possible she developed some otoliths and had a true vertiginous episode. He did stop her HCTZ and I would agree with this change. We'll maintain the lisinopril 2.5 and check a renal panel today. Reassess at next visit or as needed. In the hospital he did give her a small prescription for some alprazolam to use on the off chance that it was an anxiety component. This may also be helpful she has a true vertiginous episode as well they are encouraged use small amounts with supervision to see if she gets some symptom improvement should these episodes recur.  Hypertension Adequately controlled on small amount of Lisinopril, continue the same

## 2011-02-09 NOTE — Assessment & Plan Note (Signed)
Notes some recent increased frequency lately. UA is questionable will send for culture to further evaluate

## 2011-02-09 NOTE — Assessment & Plan Note (Signed)
When she was admitted to the hospital she does describe classic spinning sensation. It happened on the second part resolved then happened again on the third she fell onto her bed decided to call EMS and still had vertigo when she presented to the hospital. Since then it has resolved and has not recurred. It is possible she developed some otoliths and had a true vertiginous episode. He did stop her HCTZ and I would agree with this change. We'll maintain the lisinopril 2.5 and check a renal panel today. Reassess at next visit or as needed. In the hospital he did give her a small prescription for some alprazolam to use on the off chance that it was an anxiety component. This may also be helpful she has a true vertiginous episode as well they are encouraged use small amounts with supervision to see if she gets some symptom improvement should these episodes recur.

## 2011-02-09 NOTE — Assessment & Plan Note (Signed)
Adequately controlled on small amount of Lisinopril, continue the same

## 2011-02-09 NOTE — Assessment & Plan Note (Signed)
Was admitted to the hospital it was a long for 3 days from September 3 to September 5 with some questionable neurologic symptoms. She had some forgetfulness, some blurry vision and some vertigo MRI angiogram of the head and brain did not show any acute or major previous strokes, blood clots or anomalies. She was maintained on Aggrenox and has not had a recurrent episode. Labs and workup at the hospital were reviewed. Thyroid wasn't checked nor was a urinalysis we will run those today. She was advised to return or neural and at this point feels she does not want to. She is encouraged to consider going back if she has any further symptoms. They did switch her from HCTZ to lisinopril and she is tolerating that well. She does continue to have some unsteady gait and her old right-sided weakness we will arrange for home PT eval. Former neurologic headaches on the right side we started Neurontin and she does feel like the headaches are significantly improved we will continue that at the present time

## 2011-02-09 NOTE — Telephone Encounter (Signed)
Will complete referral in her note from today's visit.

## 2011-02-09 NOTE — Patient Instructions (Signed)
Vertigo (Dizziness) Your exam shows you have had an episode of dizziness or vertigo. Dizziness can be caused by many different problems. These include low blood pressure, narrowed blood vessels in the brain, neurologic and heart problems, low blood sugar, and emotional states. True vertigo causes a false sense of movement such as a spinning feeling or walls that seem to move. Vertigo is usually made worse by changing position and relieved by resting. This type of vertigo is often thought to be caused by a virus infection involving the inner ear, or rarely, an unusual migraine headache. The treatment of vertigo includes:  Bed rest and clear liquids.   Medications to reduce dizziness, nausea, and vomiting.   Avoid alcohol, tranquilizers, nicotine, caffeine, and recreational drugs.  Most of the time benign vertigo is much better after 3 days. However, mild unsteadiness may last for up to 3 months in some patients. An MRI scan or other special tests to evaluate your hearing and balance may be needed if the vertigo does not improve or returns in the future. Please see your doctor at once or go to the emergency room if you have any of these warning signs:  Increasing vertigo, earache, ear drainage, or loss of hearing.   Severe headache, blurred or double vision, or trouble walking.   Fainting, extreme weakness, chest pain, or palpitations.   Fever, persistent vomiting, or dehydration.   Numbness or weakness of the limbs.  Document Released: 05/16/2005 Document Re-Released: 08/10/2009 [facility] Patient Information 2011 [place], [place].

## 2011-02-10 ENCOUNTER — Telehealth: Payer: Self-pay

## 2011-02-10 NOTE — Telephone Encounter (Signed)
Thank you :)

## 2011-02-10 NOTE — Telephone Encounter (Signed)
Britta Mccreedy from [facility] is requesting an order for physical therapy for patient? Patient is having trouble with right leg, leg is turning outwards when walking and is weak.

## 2011-02-11 NOTE — Telephone Encounter (Signed)
Already done

## 2011-02-11 NOTE — Progress Notes (Signed)
Pt informed

## 2011-02-12 LAB — URINE CULTURE: Colony Count: >=100000

## 2011-02-14 MED ORDER — CEFDINIR 300 MG PO CAPS: 300.0000 mg | ORAL_CAPSULE | Freq: Two times a day (BID) | ORAL | Status: DC

## 2011-02-14 NOTE — Progress Notes (Signed)
Addended by: Court Joy on: 02/14/2011 08:32 AM   Modules accepted: Orders

## 2011-02-24 ENCOUNTER — Other Ambulatory Visit: Payer: Self-pay | Admitting: Family Medicine

## 2011-02-24 LAB — PROTIME-INR
INR: 0.9
Prothrombin Time: 12.4
Prothrombin Time: 13.1

## 2011-02-24 LAB — URINALYSIS, ROUTINE W REFLEX MICROSCOPIC
Bilirubin Urine: NEGATIVE
Hgb urine dipstick: NEGATIVE
Ketones, ur: NEGATIVE
Nitrite: NEGATIVE
Protein, ur: NEGATIVE
Specific Gravity, Urine: 1.011
Urobilinogen, UA: 0.2

## 2011-02-24 LAB — CBC
MCHC: 33.1
MCHC: 33.2
MCV: 94.7
Platelets: 239
RBC: 4.39
RDW: 13.2
RDW: 13.3

## 2011-02-24 LAB — COMPREHENSIVE METABOLIC PANEL
ALT: 12
Albumin: 3.4 — ABNORMAL LOW
Albumin: 3.8
Alkaline Phosphatase: 72
Alkaline Phosphatase: 83
BUN: 11
BUN: 14
Chloride: 104
Creatinine, Ser: 0.96
Glucose, Bld: 107 — ABNORMAL HIGH
Glucose, Bld: 193 — ABNORMAL HIGH
Potassium: 3.9
Sodium: 138
Total Bilirubin: 0.7
Total Bilirubin: 0.8
Total Protein: 5.9 — ABNORMAL LOW
Total Protein: 6.4

## 2011-02-24 LAB — APTT: aPTT: 29

## 2011-02-24 LAB — DIFFERENTIAL
Basophils Absolute: 0
Basophils Relative: 1
Lymphocytes Relative: 26
Monocytes Absolute: 0.5
Neutro Abs: 4.3
Neutrophils Relative %: 66

## 2011-02-24 LAB — CARDIAC PANEL(CRET KIN+CKTOT+MB+TROPI)
Relative Index: INVALID
Total CK: 43
Total CK: 54

## 2011-02-24 LAB — HOMOCYSTEINE: Homocysteine: 8.4

## 2011-02-24 LAB — HEMOGLOBIN A1C
Hgb A1c MFr Bld: 5.5
Mean Plasma Glucose: 119

## 2011-02-24 LAB — LIPID PANEL: Cholesterol: 109

## 2011-02-24 LAB — CK TOTAL AND CKMB (NOT AT ARMC)
CK, MB: 1.9
Total CK: 77

## 2011-02-24 LAB — TSH: TSH: 1.801

## 2011-02-24 LAB — TROPONIN I: Troponin I: <0.01

## 2011-02-24 NOTE — Telephone Encounter (Signed)
Please advise refill? 

## 2011-02-24 NOTE — Discharge Summary (Signed)
NAMEMarland Kitchen  KRISHIKA, BUGGE NO.:  [id number]  MEDICAL RECORD NO.:  [id number]  LOCATION:  1417                         FACILITY:  [facility]  PHYSICIAN:  Hillery Aldo, M.D.   DATE OF BIRTH:  Mar 07, 1927  DATE OF ADMISSION:  01/30/2011 DATE OF DISCHARGE:  02/01/2011                              DISCHARGE SUMMARY   PRIMARY CARE PHYSICIAN:  Danise Edge, MD with [place] [facility].  NEUROLOGIST:  Melvyn Novas, MD  DISCHARGE DIAGNOSES: 1. Transient ischemic attack with complaints of vertigo, left-sided     frontal and parietal headache, and presyncope. 2. Hypertension. 3. Small vessel cerebrovascular disease. 4. Hyponatremia. 5. Hypokalemia. 6. Hypothyroidism. 7. Anxiety.  DISCHARGE MEDICATIONS: 1. Lisinopril 2.5 mg p.o. daily. 2. Xanax 0.25 mg 1-2 tablets p.o. b.i.d. p.r.n. anxiety. 3. Aggrenox 25/200 one capsule p.o. b.i.d. 4. Fish oil 2 capsules p.o. q.a.m., 1 capsule p.o. q.p.m. 5. Gabapentin 100 mg p.o. daily at h.s. 6. Latanoprost 0.005% 1 drop in both eyes q.h.s. 7. Levothroid 75 mcg p.o. daily. 8. Timolol 0.5% in both eyes daily. 9. Tylenol 500 mg p.o. q.4-6 hours p.r.n. pain. 10.Vitamin B12 one tablet p.o. daily. 11.Vitamin D3 one tablet p.o. daily.  Note:  Following medications were discontinued:  Hydrochlorothiazide.  CONSULTATIONS:  Physical therapy/occupational therapy/speech therapy.  BRIEF ADMISSION HISTORY OF PRESENT ILLNESS:  The patient is an 77 year old female with past medical history of brain stem TIAs as well as hypertension and previous history of stroke, who is maintained on Aggrenox therapy.  She presented to the hospital with a chief complaint of presyncope and vertigo type symptoms, progressing to the point that she felt she needed to be evaluated.  She also complained of headache and blurry vision.  Upon initial evaluation in the emergency department, the patient was found to be stable, but did have some vertical nystagmus.   She also was mildly hypokalemic and hyponatremic, and subsequently referred to the hospitalist service for further evaluation and treatment.  For full details, please see the dictated report done by Dr. Susie Cassette.  PROCEDURES AND DIAGNOSTIC STUDIES: 1. CT scan of the head on January 30, 2011, showed changes of atrophy     and small vessel disease with no evidence for acute intracranial     abnormality. 2. Chest x-ray on January 30, 2011, showed no definite acute     cardiopulmonary disease.  Interstitial thickening at the left lung     base, but early pneumonia could not be ruled out. 3. MRI/MRA of the brain on January 31, 2011, showed no acute     intracranial abnormality or significant interval change.  Stable     atrophy and white matter disease.  No significant proximal     stenosis, aneurysm, or branch vessel occlusion in the vessels.     Minimal small vessel disease. 4. Carotid Dopplers done on January 31, 2011, showed no obvious     evidence of RCA stenosis noted.  Vertebral flow was antegrade. 5. Two-dimensional echocardiogram on January 31, 2011, showed normal     systolic function with ejection fraction undocumented.  No regional     wall motion abnormality.  DISCHARGE LABORATORY VALUES:  Hemoglobin A1c was  6.2.  TSH was 1.175. Cardiac markers were negative.  Sodium was 136, potassium 3.7, chloride 103, bicarb 26, BUN 9, creatinine 0.62, glucose 87, calcium 8.7. Homocystine was 7.8.  Cholesterol was 148, triglycerides 93, HDL 46, LDL 83.  HOSPITAL COURSE BY PROBLEM: 1. TIA in the setting of small vessel cerebrovascular disease:  The     patient was admitted and a full stroke evaluation was undertaken.     The patient's symptoms improved with supportive therapy.  She was     seen and evaluated by Physical, Occupational Therapy as well as     Speech Therapy with no deficits noted.  Recommendations were for a     home health PT safety evaluation, but no further therapy  indicated.     The patient will continue on Aggrenox and follow up with Dr.     Vickey Huger for further evaluation should her symptoms return.  Of     note, there was no arrhythmic events noted on telemetry. 2. Hypertension.  The patient has been maintained on     hydrochlorothiazide.  Given her electrolyte abnormalities on     admission, this was discontinued and she was started on lisinopril.     Her discharge blood pressure is 124/74. 3. Hyponatremia/hypokalemia:  Resolved with discontinuation of     hydrochlorothiazide. 4. Hypothyroidism:  The patient is appropriately replaced, given her     current TSH within normal limits. 5. Anxiety:  The patient's son reports that the patient's symptoms     were precipitated by significant anxiety.  At this point, we will     treat her symptomatically with Xanax for help with anxiety control     on a situational basis.  DISPOSITION:  The patient is medically stable and will be discharged home.  CONDITION ON DISCHARGE:  Improved.  DISCHARGE INSTRUCTIONS:  Activity:  Increase activity slowly.  Diet:  Low-sodium, heart-healthy  Followup:  With Dr. Abner Greenspan in 2 to 3 weeks.  Follow up with Dr. Vickey Huger in 1 to 2 weeks.  Call for appointment times.  Time spent coordinating care for discharge, discharge instructions, including face-to-face timing is approximately 35 minutes.     Hillery Aldo, M.D.     CR/MEDQ  D:  02/01/2011  T:  02/01/2011  Job:  [id number]  cc:   Melvyn Novas, M.D. Fax: [phone]  Danise Edge, MD Fax: [phone]  Electronically Signed by Hillery Aldo M.D. on 02/24/2011 05:49:23 PM

## 2011-02-24 NOTE — Telephone Encounter (Signed)
Need to know what she wants this for. We do not routinely just refill this medication without knowing what symptoms we are treating

## 2011-02-25 LAB — CBC
HCT: 40.2
MCHC: 34
MCV: 94.7
RBC: 4.24
WBC: 7.4

## 2011-02-25 LAB — COMPREHENSIVE METABOLIC PANEL
BUN: 12
CO2: 27
Chloride: 103
Creatinine, Ser: 0.82
GFR calc non Af Amer: >60
Glucose, Bld: 128 — ABNORMAL HIGH
Total Bilirubin: 0.4

## 2011-02-25 LAB — DIFFERENTIAL
Basophils Absolute: 0
Eosinophils Relative: 0
Lymphocytes Relative: 13
Neutro Abs: 6
Neutrophils Relative %: 81 — ABNORMAL HIGH

## 2011-02-25 LAB — APTT: aPTT: 27

## 2011-02-25 LAB — PROTIME-INR
INR: 0.9
Prothrombin Time: 12.6

## 2011-03-03 ENCOUNTER — Other Ambulatory Visit: Payer: Self-pay

## 2011-03-03 NOTE — Telephone Encounter (Signed)
Please advise refill? 

## 2011-03-03 NOTE — H&P (Signed)
NAME:  Darlene Wilson, Darlene Wilson NO.:  [id number]  MEDICAL RECORD NO.:  [id number]  LOCATION:  [facility]                         FACILITY:  [facility]  PHYSICIAN:  Richarda Overlie, MD       DATE OF BIRTH:  Aug 04, 1926  DATE OF ADMISSION:  01/30/2011 DATE OF DISCHARGE:                             HISTORY & PHYSICAL   PRIMARY CARE PHYSICIAN:  Production assistant, radio.  CHIEF COMPLAINT:  Vertigo.  SUBJECTIVE:  This is an 77 year old female with a prior history of brainstem TIA as well as hypertension and previous history of stroke on Aggrenox, who presents to the ER with chief complaining of feeling dizzy since yesterday evening.  The patient's dizziness started around 7 p.m. yesterday and has progressively gotten worse to the point that she was unable to walk and that is why she presented to the ER around 4 p.m. today.  The patient also had some associated blurry vision, but she has not had any episodes of nausea, vomiting, diarrhea, slurred speech, or unilateral weakness.  The symptoms are quite similar to her previous episode in 2009.  She states that she has had a total of four TIAs in the past.  Her symptoms are also associated with left-sided frontal and parietal headache, but no associated fever, chills, or rigors.  No neck stiffness.  No meningismus.  She denies any chest pain or palpitations. She denies any shortness of breath, orthopnea, paroxysmal nocturnal dyspnea.  She denies any associated nausea, vomiting, abdominal pain, or any other symptoms at this point.  She states that her gait is ataxic, but she has not had any syncopal episodes of falls since the onset of her symptoms.  She denies any tinnitus.  Denies any ocular pain, denies any ear pain, denies any symptoms of sinus congestion or sore throat.  REVIEW OF SYSTEMS:  Fourteen point review of systems was done as documented in the HPI.  PAST MEDICAL HISTORY: 1. History of hypertension. 2. Hypothyroidism. 3.  TIA x4.  PAST SURGICAL HISTORY:  History of D and C, history of lumpectomy x3, history of hysterectomy.  SOCIAL HISTORY:  The patient lives alone.  She does not smoke.  She drinks 3 glasses of wines a week.  She denies any illicit drug use.  She is a retired Curator business at one point.  FAMILY HISTORY:  No history of CVA, but both her parents have hypertension and coronary artery disease.  ALLERGIES:  No known drug allergies.  HOME MEDICATIONS: 1. Gabapentin. 2. Aggrenox. 3. HCTZ. 4. Levothyroxine.  PHYSICAL EXAMINATION:  VITAL SIGNS:  Blood pressure 149/79, pulse of 72, respirations 18, temperature 97.9. GENERAL:  Currently comfortable in no acute cardiopulmonary distress. HEENT:  Pupils equal and reactive.  Extraocular movements intact. NECK:  Supple.  No JVD. LUNGS:  Clear to auscultation bilaterally.  No wheezes or crackles or rhonchi. CARDIOVASCULAR:  Regular rate and rhythm.  No murmurs, rubs, or gallops. ABDOMEN:  Obese, soft, nontender, nondistended. EXTREMITIES:  Without cyanosis, clubbing, or edema. NEUROLOGIC:  She does have vertical nystagmus.  No meningismus.  Cranial nerves are otherwise II to XII grossly intact.  Pupils are symmetric and reactive.  No  facial asymmetry.  Tongue is in the midline.  Motor strength intact in bilateral upper and lower extremities.  Deep tendon reflexes 2+ bilaterally and symmetric.  Her vibration and position sense is intact.  Finger-to-nose testing and heel is testing is intact. LYMPHATIC:  No axillary, inguinal, or cervical lymphadenopathy. PSYCHIATRIC:  Appropriate mood and affect.  LABORATORY DATA:  EKG is pending.  Her rhythm strip from the ER shows normal sinus rhythm.  A CT of the brain without contrast shows changes of atrophy and small vessel disease without any evidence of acute intracranial abnormality.  WBC 6.1, hemoglobin 11.7, hematocrit 35.6, and platelet count of 301.  Troponin  negative.  Urinalysis negative. Sodium 132, potassium 3.3, chloride 96, bicarbonate 28, glucose 104, BUN 14, creatinine 0.71, calcium 9.3, AST 16, ALT 11, total bilirubin 0.3. Chest x-ray shows no definite acute cardiopulmonary disease with interstitial thickening in the left lung base.  We will repeat chest x- ray to rule out early pneumonia in the morning.  ASSESSMENT: 1. Transient ischemic attack versus brainstem cerebrovascular accident     resulting in vertigo and nystagmus and gait imbalance. 2. Hypokalemia and hyponatremia likely secondary to HCTZ. 3. Hypothyroidism.  PLAN:  The patient will be admitted for observation overnight.  We will do a full stroke workup including an MRI, MRA of the brain, a 2-D echo, a carotid Doppler, and check her for risk factors, namely lipid panel, hemoglobin A1c.  We will obtain a full evaluation including a PT, OT evaluation as well as a speech therapy evaluation.  We will check her cardiac enzymes and do an EKG to evaluate her rhythm.  We will cycle cardiac enzymes to rule out an acute coronary syndrome.  We will also repeat her potassium and hold her HCTZ tonight.  For antiplatelettherapy, we will continue her on Aggrenox and also start her on full- dose aspirin.  If the patient does have any evidence of a TIA or CVA, she may need to be switched to Plavix prior to discharge.  She is a full code.     Richarda Overlie, MD     NA/MEDQ  D:  01/30/2011  T:  01/30/2011  Job:  [id number]  Electronically Signed by Richarda Overlie MD on 03/03/2011 01:29:25 PM

## 2011-03-04 NOTE — Telephone Encounter (Signed)
Antibiotic do not refill unless I know symptoms and history

## 2011-03-08 ENCOUNTER — Other Ambulatory Visit: Payer: Medicare Other

## 2011-04-15 ENCOUNTER — Other Ambulatory Visit: Payer: Self-pay

## 2011-04-15 MED ORDER — LISINOPRIL 2.5 MG PO TABS: 2.5000 mg | ORAL_TABLET | Freq: Every day | ORAL | Status: DC

## 2011-06-14 ENCOUNTER — Ambulatory Visit (INDEPENDENT_AMBULATORY_CARE_PROVIDER_SITE_OTHER): Payer: Medicare Other | Admitting: Family Medicine

## 2011-06-14 ENCOUNTER — Encounter: Payer: Self-pay | Admitting: Family Medicine

## 2011-06-14 DIAGNOSIS — Z8673 Personal history of transient ischemic attack (TIA), and cerebral infarction without residual deficits: Secondary | ICD-10-CM | POA: Diagnosis not present

## 2011-06-14 DIAGNOSIS — I10 Essential (primary) hypertension: Secondary | ICD-10-CM | POA: Diagnosis not present

## 2011-06-14 DIAGNOSIS — M4800 Spinal stenosis, site unspecified: Secondary | ICD-10-CM

## 2011-06-14 DIAGNOSIS — N390 Urinary tract infection, site not specified: Secondary | ICD-10-CM

## 2011-06-14 DIAGNOSIS — M545 Low back pain: Secondary | ICD-10-CM | POA: Diagnosis not present

## 2011-06-14 DIAGNOSIS — R350 Frequency of micturition: Secondary | ICD-10-CM | POA: Diagnosis not present

## 2011-06-14 HISTORY — DX: Spinal stenosis, site unspecified: M48.00

## 2011-06-14 LAB — POCT URINALYSIS DIPSTICK
Nitrite, UA: NEGATIVE
pH, UA: 6

## 2011-06-14 MED ORDER — ACETAMINOPHEN-CODEINE #3 300-30 MG PO TABS: 1.0000 | ORAL_TABLET | Freq: Four times a day (QID) | ORAL | Status: AC | PRN

## 2011-06-14 NOTE — Patient Instructions (Signed)
Spinal Stenosis One cause of back pain is spinal stenosis. Stenosis means abnormal narrowing. The spinal canal contains and protects the spinal nerve roots. In spinal stenosis, the spinal canal narrows and pinches the spinal cord and nerves. This causes low back pain and pain in the legs. Stenosis may pinch the nerves that control muscles and sensation in the legs. This leads to pain and abnormal feelings in the leg muscles and areas supplied by those nerves. CAUSES  Spinal stenosis often happens to people as they get older and arthritic boney growths occur in their spinal canal. There is also a loss of the disk height between the bones of the back, which also adds to this problem. Sometimes the problem is present at birth. SYMPTOMS   Pain that is generally worse with activities, particularly standing and walking.   Numbness, tingling, hot or cold feelings, weakness, or a weariness in the legs.   Clumsiness, frequent falling, and a foot-slapping gait, which may come as a result of nerve pressure and muscle weakness.  DIAGNOSIS   Your caregiver may suspect spinal stenosis if you have unusual leg symptoms, such as those previously mentioned.   Your orthopedic surgeon may request special imaging exams, such a computerized magnetic scan (MRI) or computerized X-ray scan (CT) to find out the cause of the problem.  TREATMENT   Sometimes treatments such as postural changes or nonsteroidal anti-inflammatory drugs will relieve the pain.   Nonsteroidal anti-inflammatory medications may help relieve symptoms. These medicines do this by decreasing swelling and inflammation in the nerves.   When stenosis causes severe nerve root compression, conservative treatment may not be enough to maintain a normal lifestyle. Surgery may be recommended to relieve the pressure on affected nerves. In properly selected patients, the results are very good, and patients are able to continue a normal lifestyle.  HOME CARE  INSTRUCTIONS   Flexing the spine by leaning forward while walking may relieve symptoms. Lying with the knees drawn up to the chest may offer some relief. These positions enlarge the space available to the nerves. They may make it easier for stenosis sufferers to walk longer distances.   Rest, followed by gradually resuming activity, also can help.   Aerobic activity, such as bicycling or swimming, is often recommended.   Losing weight can also relieve some of the load on the spine.   Application of warm or cold compresses to the area of pain can be helpful.  SEEK MEDICAL CARE IF:   The periods of relief between episodes of pain become shorter and shorter.   You experience pain that radiates down your leg, even when you are not standing or walking.  SEEK IMMEDIATE MEDICAL CARE IF:   You have a loss of bowel or bladder control.   You have a sudden loss of feeling in your legs.   You suddenly cannot move your legs.  Document Released: 08/06/2003 Document Revised: 01/26/2011 Document Reviewed: 10/01/2009 [facility] Patient Information 2012 [place], [place].

## 2011-06-14 NOTE — Assessment & Plan Note (Signed)
Adequately controlled 

## 2011-06-14 NOTE — Assessment & Plan Note (Signed)
No true vertiginous episodes reported but does struggle with disequilibrium especially at night, no worsening, has follow up with Neuro next month, encouraged her to use her walker qhs and report worsening symptoms

## 2011-06-14 NOTE — Assessment & Plan Note (Signed)
No recent episodes

## 2011-06-14 NOTE — Progress Notes (Signed)
Patient ID: Darlene Wilson, female   DOB: 1926-07-27, 78 y.o.   MRN: [id number] Darlene Wilson [id number] 12-13-1926 06/14/2011      Progress Note-Follow Up  Subjective  Chief Complaint  Chief Complaint  Patient presents with  . Follow-up    4 month follow up    HPI  Patient is an 78 year old Caucasian female who is in today accompanied by her daughter-in-law. Overall she's doing very well. She's had no recent illness, fevers, chills, chest pain, trips in the emergency room. She continues to struggle with some lightheadedness and disequilibrium most notably at night upon arising to use the restroom. She'll sometimes stumble into her wall and worries about hurting herself. She denies any headache, true syncope or injuries as result. She's not using assistive devices at night. She denies any urinary complaints status post treatment of UTI. No hematuria, dysuria or urgency is noted. She continues to follow with neurology which does not have her next appointment until February.  Past Medical History  Diagnosis Date  . History of chicken pox   . Glaucoma     both eyes  . Hypertension   . Chronic kidney disease 1998    stones  . Stroke 04/2007    x 3 total  . Thyroid disease     hypothyroid  . Fibrocystic breast 01/12/2011  . Insomnia 01/13/2011  . Urinary frequency 02/09/2011  . Vertigo 02/09/2011  . Spinal stenosis 06/14/2011    Past Surgical History  Procedure Date  . Abdominal hysterectomy 1963  . Back surgery 1989    discectomy L4-5 1989, good results  . Breast surgery 1970    biopsy, benign, b/l    Family History  Problem Relation Age of Onset  . Heart disease Mother   . Hypertension Mother   . Diabetes Mother   . Heart disease Father   . Hypertension Father   . Cancer Sister     breast cancer    History   Social History  . Marital Status: Widowed    Spouse Name: N/A    Number of Children: 2  . Years of Education: N/A   Occupational History  . Not on file.    Social History Main Topics  . Smoking status: Never Smoker   . Smokeless tobacco: Never Used  . Alcohol Use: 1.8 oz/week    3 Glasses of wine per week  . Drug Use: No  . Sexually Active: Not Currently   Other Topics Concern  . Not on file   Social History Narrative  . No narrative on file    Current Outpatient Prescriptions on File Prior to Visit  Medication Sig Dispense Refill  . dipyridamole-aspirin (AGGRENOX) 25-200 MG per 12 hr capsule Take 1 capsule by mouth 2 (two) times daily.        Marland Kitchen gabapentin (NEURONTIN) 100 MG capsule Take 100 mg by mouth at bedtime.        Marland Kitchen latanoprost (XALATAN) 0.005 % ophthalmic solution Place 1 drop into both eyes at bedtime.        Marland Kitchen levothyroxine (SYNTHROID, LEVOTHROID) 75 MCG tablet Take 1 tablet (75 mcg total) by mouth daily.  90 tablet  1  . lisinopril (PRINIVIL,ZESTRIL) 2.5 MG tablet Take 1 tablet (2.5 mg total) by mouth daily.  30 tablet  3  . Misc. Devices (BATH/SHOWER SEAT) MISC 1 each by Does not apply route as needed.  1 each  0  . timolol (BETIMOL) 0.5 % ophthalmic solution Place 1 drop  into both eyes every morning.          Allergies  Allergen Reactions  . Ciprofloxacin Hives    Given with 2 other meds/unsure if truly allergic  . Combigan (Brimonidine Tartrate-Timolol) Other (See Comments)    Inflamation  . Detrol (Tolterodine Tartrate) Hives    Given with 2 other medications/unsure if truly allergic  . Oxycodone Hives    Given with 2 other medications/unsure if truly allergic  . Penicillins   . Sulfa Antibiotics     Review of Systems  Review of Systems  Constitutional: Negative for fever and malaise/fatigue.  HENT: Negative for congestion.   Eyes: Negative for discharge.  Respiratory: Negative for shortness of breath.   Cardiovascular: Negative for chest pain, palpitations and leg swelling.  Gastrointestinal: Negative for nausea, abdominal pain and diarrhea.  Genitourinary: Negative for dysuria.  Musculoskeletal:  Negative for falls.  Skin: Negative for rash.  Neurological: Positive for dizziness. Negative for loss of consciousness and headaches.  Endo/Heme/Allergies: Negative for polydipsia.  Psychiatric/Behavioral: Negative for depression and suicidal ideas. The patient is not nervous/anxious and does not have insomnia.     Objective  BP 112/67  Pulse 91  Temp(Src) 97.8 F (36.6 C) (Temporal)  Ht 5' 2.75" (1.594 m)  Wt 127 lb (57.607 kg)  BMI 22.68 kg/m2  SpO2 96%  Physical Exam  Physical Exam  Constitutional: She is oriented to person, place, and time and well-developed, well-nourished, and in no distress. No distress.  HENT:  Head: Normocephalic and atraumatic.  Eyes: Conjunctivae are normal.  Neck: Neck supple. No thyromegaly present.  Cardiovascular: Normal rate, regular rhythm and normal heart sounds.   No murmur heard. Pulmonary/Chest: Effort normal and breath sounds normal. She has no wheezes.  Abdominal: She exhibits no distension and no mass.  Musculoskeletal: She exhibits no edema.  Lymphadenopathy:    She has no cervical adenopathy.  Neurological: She is alert and oriented to person, place, and time.  Skin: Skin is warm and dry. No rash noted. She is not diaphoretic.  Psychiatric: Memory, affect and judgment normal.    Lab Results  Component Value Date   TSH 0.76 02/09/2011   Lab Results  Component Value Date   WBC 7.1 02/09/2011   HGB 12.6 02/09/2011   HCT 39.9 02/09/2011   MCV 87.7 02/09/2011   PLT 379 02/09/2011   Lab Results  Component Value Date   CREATININE 1.0 02/09/2011   BUN 19 02/09/2011   NA 140 02/09/2011   K 3.9 02/09/2011   CL 104 02/09/2011   CO2 27 02/09/2011   Lab Results  Component Value Date   ALT 13 02/09/2011   AST 20 02/09/2011   ALKPHOS 96 02/09/2011   BILITOT 0.3 02/09/2011   Lab Results  Component Value Date   CHOL 148 01/31/2011   Lab Results  Component Value Date   HDL 46 01/31/2011   Lab Results  Component Value Date   LDLCALC 83  01/31/2011   Lab Results  Component Value Date   TRIG 93 01/31/2011   Lab Results  Component Value Date   CHOLHDL 3.2 01/31/2011     Assessment & Plan  Urinary frequency UTI symptoms improved but UA shows some wbc, check a urine culture.   Vertigo No true vertiginous episodes reported but does struggle with disequilibrium especially at night, no worsening, has follow up with Neuro next month, encouraged her to use her walker qhs and report worsening symptoms  Stroke No recent episodes  Hypertension Adequately controlled

## 2011-06-14 NOTE — Assessment & Plan Note (Signed)
UTI symptoms improved but UA shows some wbc, check a urine culture.

## 2011-06-16 LAB — URINE CULTURE: Colony Count: 15000

## 2011-07-07 DIAGNOSIS — H4011X0 Primary open-angle glaucoma, stage unspecified: Secondary | ICD-10-CM | POA: Diagnosis not present

## 2011-07-07 DIAGNOSIS — H409 Unspecified glaucoma: Secondary | ICD-10-CM | POA: Diagnosis not present

## 2011-07-20 ENCOUNTER — Other Ambulatory Visit: Payer: Self-pay

## 2011-07-20 MED ORDER — GABAPENTIN 100 MG PO CAPS: 100.0000 mg | ORAL_CAPSULE | Freq: Every day | ORAL | Status: DC

## 2011-07-20 MED ORDER — LISINOPRIL 2.5 MG PO TABS: 2.5000 mg | ORAL_TABLET | Freq: Every day | ORAL | Status: DC

## 2011-08-22 ENCOUNTER — Encounter: Payer: Self-pay | Admitting: Family Medicine

## 2011-08-25 ENCOUNTER — Emergency Department (HOSPITAL_COMMUNITY): Payer: Medicare Other

## 2011-08-25 ENCOUNTER — Other Ambulatory Visit: Payer: Self-pay

## 2011-08-25 ENCOUNTER — Encounter (HOSPITAL_COMMUNITY): Payer: Self-pay | Admitting: Cardiology

## 2011-08-25 ENCOUNTER — Emergency Department (HOSPITAL_COMMUNITY)
Admission: EM | Admit: 2011-08-25 | Discharge: 2011-08-25 | Disposition: A | Discharge status: Home / Self Care | Payer: Medicare Other | Attending: Emergency Medicine | Admitting: Emergency Medicine

## 2011-08-25 DIAGNOSIS — R404 Transient alteration of awareness: Secondary | ICD-10-CM | POA: Diagnosis not present

## 2011-08-25 DIAGNOSIS — R42 Dizziness and giddiness: Secondary | ICD-10-CM | POA: Diagnosis not present

## 2011-08-25 DIAGNOSIS — Z8673 Personal history of transient ischemic attack (TIA), and cerebral infarction without residual deficits: Secondary | ICD-10-CM | POA: Insufficient documentation

## 2011-08-25 DIAGNOSIS — R51 Headache: Secondary | ICD-10-CM | POA: Diagnosis not present

## 2011-08-25 DIAGNOSIS — E039 Hypothyroidism, unspecified: Secondary | ICD-10-CM | POA: Insufficient documentation

## 2011-08-25 DIAGNOSIS — N189 Chronic kidney disease, unspecified: Secondary | ICD-10-CM | POA: Insufficient documentation

## 2011-08-25 DIAGNOSIS — I129 Hypertensive chronic kidney disease with stage 1 through stage 4 chronic kidney disease, or unspecified chronic kidney disease: Secondary | ICD-10-CM | POA: Diagnosis not present

## 2011-08-25 DIAGNOSIS — H409 Unspecified glaucoma: Secondary | ICD-10-CM | POA: Insufficient documentation

## 2011-08-25 DIAGNOSIS — Z7982 Long term (current) use of aspirin: Secondary | ICD-10-CM | POA: Diagnosis not present

## 2011-08-25 LAB — CBC
HCT: 37.3 % (ref 36.0–46.0)
Hemoglobin: 12 g/dL (ref 12.0–15.0)
MCH: 28.2 pg (ref 26.0–34.0)
MCHC: 32.2 g/dL (ref 30.0–36.0)
MCV: 87.6 fL (ref 78.0–100.0)
RBC: 4.26 MIL/uL (ref 3.87–5.11)

## 2011-08-25 LAB — COMPREHENSIVE METABOLIC PANEL
Albumin: 3.4 g/dL — ABNORMAL LOW (ref 3.5–5.2)
Alkaline Phosphatase: 97 U/L (ref 39–117)
BUN: 13 mg/dL (ref 6–23)
Creatinine, Ser: 0.66 mg/dL (ref 0.50–1.10)
GFR calc Af Amer: >90 mL/min (ref >90)
Glucose, Bld: 103 mg/dL — ABNORMAL HIGH (ref 70–99)
Potassium: 3.7 mEq/L (ref 3.5–5.1)
Total Bilirubin: 0.3 mg/dL (ref 0.3–1.2)
Total Protein: 6.1 g/dL (ref 6.0–8.3)

## 2011-08-25 LAB — TROPONIN I: Troponin I: <0.3 ng/mL (ref <0.30)

## 2011-08-25 LAB — DIFFERENTIAL
Basophils Relative: 0 % (ref 0–1)
Eosinophils Absolute: 0.1 10*3/uL (ref 0.0–0.7)
Lymphs Abs: 1.7 10*3/uL (ref 0.7–4.0)
Monocytes Absolute: 0.5 10*3/uL (ref 0.1–1.0)
Monocytes Relative: 8 % (ref 3–12)
Neutrophils Relative %: 63 % (ref 43–77)

## 2011-08-25 MED ORDER — MECLIZINE HCL 25 MG PO TABS
25.0000 mg | ORAL_TABLET | Freq: Three times a day (TID) | ORAL | Status: AC | PRN
Start: 2011-08-25 — End: 2011-09-04

## 2011-08-25 MED ORDER — MECLIZINE HCL 25 MG PO TABS
ORAL_TABLET | ORAL | Status: AC
  Filled 2011-08-25: qty 1

## 2011-08-25 MED ORDER — MECLIZINE HCL 25 MG PO TABS
25.0000 mg | ORAL_TABLET | Freq: Once | ORAL | Status: AC
  Administered 2011-08-25: 25 mg via ORAL

## 2011-08-25 NOTE — ED Provider Notes (Signed)
History     CSN: [id number]  Arrival date & time 08/25/11  1048   First MD Initiated Contact with Patient 08/25/11 1103      Chief Complaint  Patient presents with  . Headache  . Dizziness    (Consider location/radiation/quality/duration/timing/severity/associated sxs/prior treatment) HPI Comments: Woke from sleep with spinning sensation, dizziness.  Couldn't walk, and had to crawl to the phone to call son.    Patient is a 78 y.o. female presenting with headaches. The history is provided by the patient.  Headache  This is a new problem. The current episode started 3 to 5 hours ago. The problem has been gradually improving. The headache is associated with nothing. The quality of the pain is described as dull. The pain is moderate. The pain does not radiate. Associated symptoms comments: dizziness.    Past Medical History  Diagnosis Date  . History of chicken pox   . Glaucoma     both eyes  . Hypertension   . Chronic kidney disease 1998    stones  . Stroke 04/2007    x 3 total  . Thyroid disease     hypothyroid  . Fibrocystic breast 01/12/2011  . Insomnia 01/13/2011  . Urinary frequency 02/09/2011  . Vertigo 02/09/2011  . Spinal stenosis 06/14/2011    Past Surgical History  Procedure Date  . Abdominal hysterectomy 1963  . Back surgery 1989    discectomy L4-5 1989, good results  . Breast surgery 1970    biopsy, benign, b/l    Family History  Problem Relation Age of Onset  . Heart disease Mother   . Hypertension Mother   . Diabetes Mother   . Heart disease Father   . Hypertension Father   . Cancer Sister     breast cancer    History  Substance Use Topics  . Smoking status: Never Smoker   . Smokeless tobacco: Never Used  . Alcohol Use: 1.8 oz/week    3 Glasses of wine per week    OB History    Grav Para Term Preterm Abortions TAB SAB Ect Mult Living                  Review of Systems  Neurological: Positive for headaches.  All other systems reviewed  and are negative.    Allergies  Ciprofloxacin; Combigan; Detrol; Oxycodone; Penicillins; and Sulfa antibiotics  Home Medications   Current Outpatient Rx  Name Route Sig Dispense Refill  . ASPIRIN-DIPYRIDAMOLE ER 25-200 MG PO CP12 Oral Take 1 capsule by mouth 2 (two) times daily.      Marland Kitchen GABAPENTIN 100 MG PO CAPS Oral Take 1 capsule (100 mg total) by mouth at bedtime. 30 capsule 2  . LATANOPROST 0.005 % OP SOLN Both Eyes Place 1 drop into both eyes at bedtime.      Marland Kitchen LEVOTHYROXINE SODIUM 75 MCG PO TABS Oral Take 1 tablet (75 mcg total) by mouth daily. 90 tablet 1  . LISINOPRIL 2.5 MG PO TABS Oral Take 1 tablet (2.5 mg total) by mouth daily. 30 tablet 3  . TIMOLOL HEMIHYDRATE 0.5 % OP SOLN Both Eyes Place 1 drop into both eyes every morning.        BP 154/63  Pulse 59  Temp(Src) 97.7 F (36.5 C) (Oral)  Resp 19  SpO2 99%  Physical Exam  Nursing note and vitals reviewed. Constitutional: She is oriented to person, place, and time. She appears well-developed and well-nourished. No distress.  HENT:  Head:  Normocephalic and atraumatic.  Right Ear: External ear normal.  Left Ear: External ear normal.  Eyes: EOM are normal. Pupils are equal, round, and reactive to light.  Neck: Normal range of motion. Neck supple.  Cardiovascular: Normal rate and regular rhythm.  Exam reveals no gallop and no friction rub.   No murmur heard. Pulmonary/Chest: Effort normal and breath sounds normal. No respiratory distress. She has no wheezes.  Abdominal: Soft. Bowel sounds are normal. She exhibits no distension. There is no tenderness.  Musculoskeletal: Normal range of motion. She exhibits no edema.  Neurological: She is alert and oriented to person, place, and time. No cranial nerve deficit. She exhibits normal muscle tone. Coordination normal.  Skin: Skin is warm and dry. She is not diaphoretic.    ED Course  Procedures (including critical care time)   Labs Reviewed  CBC  DIFFERENTIAL    COMPREHENSIVE METABOLIC PANEL  TROPONIN I   No results found.   No diagnosis found.   Date: 08/25/2011  Rate: 64  Rhythm: normal sinus rhythm  QRS Axis: normal  Intervals: normal  ST/T Wave abnormalities: normal  Conduction Disutrbances:none  Narrative Interpretation:   Old EKG Reviewed: unchanged    MDM  The labs, ekg, and ct are unremarkable and is feeling better with meclizine.  This seems like peripheral vertigo.  Will discharge to home, return prn.        Geoffery Lyons, MD 08/25/11 1335

## 2011-08-25 NOTE — ED Notes (Signed)
Pt to department from home via EMS- reports that she woke up this morning with dizziness and headache. Reports that she has been nauseaed but no vomiting. Weakness noted on the right side from prior stroke. Bp-140/82 Hr- 60. CBG-83

## 2011-08-25 NOTE — Discharge Instructions (Signed)

## 2011-09-14 ENCOUNTER — Telehealth: Payer: Self-pay

## 2011-09-14 DIAGNOSIS — E039 Hypothyroidism, unspecified: Secondary | ICD-10-CM

## 2011-09-14 MED ORDER — LEVOTHYROXINE SODIUM 75 MCG PO TABS: 75.0000 ug | ORAL_TABLET | Freq: Every day | ORAL | Status: DC

## 2011-09-14 NOTE — Telephone Encounter (Signed)
RX sent to pharmacy  

## 2011-09-15 ENCOUNTER — Other Ambulatory Visit: Payer: Self-pay

## 2011-09-15 DIAGNOSIS — E039 Hypothyroidism, unspecified: Secondary | ICD-10-CM

## 2011-09-15 MED ORDER — LEVOTHYROXINE SODIUM 75 MCG PO TABS: 75.0000 ug | ORAL_TABLET | Freq: Every day | ORAL | Status: DC

## 2011-10-12 DIAGNOSIS — G459 Transient cerebral ischemic attack, unspecified: Secondary | ICD-10-CM | POA: Diagnosis not present

## 2011-10-12 DIAGNOSIS — R209 Unspecified disturbances of skin sensation: Secondary | ICD-10-CM | POA: Diagnosis not present

## 2011-12-06 ENCOUNTER — Other Ambulatory Visit (INDEPENDENT_AMBULATORY_CARE_PROVIDER_SITE_OTHER): Payer: Medicare Other

## 2011-12-06 DIAGNOSIS — N189 Chronic kidney disease, unspecified: Secondary | ICD-10-CM

## 2011-12-06 DIAGNOSIS — R82998 Other abnormal findings in urine: Secondary | ICD-10-CM | POA: Diagnosis not present

## 2011-12-06 DIAGNOSIS — R350 Frequency of micturition: Secondary | ICD-10-CM

## 2011-12-06 DIAGNOSIS — N390 Urinary tract infection, site not specified: Secondary | ICD-10-CM

## 2011-12-06 DIAGNOSIS — I10 Essential (primary) hypertension: Secondary | ICD-10-CM | POA: Diagnosis not present

## 2011-12-06 LAB — CBC
HCT: 37.8 % (ref 36.0–46.0)
Hemoglobin: 12 g/dL (ref 12.0–15.0)
Platelets: 290 10*3/uL (ref 150.0–400.0)
WBC: 6.5 10*3/uL (ref 4.5–10.5)

## 2011-12-06 LAB — POCT URINALYSIS DIPSTICK
Blood, UA: NEGATIVE
Glucose, UA: NEGATIVE
Ketones, UA: NEGATIVE
Nitrite, UA: NEGATIVE
Spec Grav, UA: 1.025
pH, UA: 6

## 2011-12-06 LAB — RENAL FUNCTION PANEL
CO2: 26 mEq/L (ref 19–32)
Calcium: 9.4 mg/dL (ref 8.4–10.5)
Chloride: 104 mEq/L (ref 96–112)
GFR: 59.73 mL/min — ABNORMAL LOW (ref >60.00)
Sodium: 138 mEq/L (ref 135–145)

## 2011-12-06 LAB — HEPATIC FUNCTION PANEL
AST: 22 U/L (ref 0–37)
Albumin: 4.1 g/dL (ref 3.5–5.2)
Alkaline Phosphatase: 83 U/L (ref 39–117)
Total Bilirubin: 0.7 mg/dL (ref 0.3–1.2)

## 2011-12-07 LAB — HEMOGLOBIN A1C: Hgb A1c MFr Bld: 6.3 % (ref 4.6–6.5)

## 2011-12-07 NOTE — Progress Notes (Signed)
Quick Note:  Pt informed ______ 

## 2011-12-08 LAB — URINE CULTURE

## 2011-12-13 ENCOUNTER — Ambulatory Visit (INDEPENDENT_AMBULATORY_CARE_PROVIDER_SITE_OTHER): Payer: Medicare Other | Admitting: Family Medicine

## 2011-12-13 ENCOUNTER — Encounter: Payer: Self-pay | Admitting: Family Medicine

## 2011-12-13 VITALS — BP 130/78 | HR 74 | Temp 97.4°F | Ht 62.75 in | Wt 132.8 lb

## 2011-12-13 DIAGNOSIS — I6350 Cerebral infarction due to unspecified occlusion or stenosis of unspecified cerebral artery: Secondary | ICD-10-CM | POA: Diagnosis not present

## 2011-12-13 DIAGNOSIS — I10 Essential (primary) hypertension: Secondary | ICD-10-CM

## 2011-12-13 DIAGNOSIS — E039 Hypothyroidism, unspecified: Secondary | ICD-10-CM

## 2011-12-13 DIAGNOSIS — E079 Disorder of thyroid, unspecified: Secondary | ICD-10-CM

## 2011-12-13 DIAGNOSIS — R42 Dizziness and giddiness: Secondary | ICD-10-CM

## 2011-12-13 DIAGNOSIS — R51 Headache: Secondary | ICD-10-CM

## 2011-12-13 DIAGNOSIS — I639 Cerebral infarction, unspecified: Secondary | ICD-10-CM

## 2011-12-13 MED ORDER — ASPIRIN-DIPYRIDAMOLE ER 25-200 MG PO CP12: 1.0000 | ORAL_CAPSULE | Freq: Two times a day (BID) | ORAL | Status: DC

## 2011-12-13 MED ORDER — LISINOPRIL 2.5 MG PO TABS: 2.5000 mg | ORAL_TABLET | Freq: Every day | ORAL | Status: DC

## 2011-12-13 MED ORDER — GABAPENTIN 100 MG PO CAPS: 100.0000 mg | ORAL_CAPSULE | Freq: Every day | ORAL | Status: DC

## 2011-12-13 MED ORDER — LEVOTHYROXINE SODIUM 75 MCG PO TABS: 75.0000 ug | ORAL_TABLET | Freq: Every day | ORAL | Status: DC

## 2011-12-13 NOTE — Assessment & Plan Note (Signed)
Check tsh today, no change in med today

## 2011-12-13 NOTE — Assessment & Plan Note (Signed)
1 episode back in March was mild and has not recurred

## 2011-12-13 NOTE — Assessment & Plan Note (Signed)
Well controlled, no changes to meds 

## 2011-12-13 NOTE — Progress Notes (Signed)
Patient ID: Darlene Wilson, female   DOB: 09-27-1926, 78 y.o.   MRN: [id number] Darlene Wilson [id number] 01-Jan-1927 12/13/2011      Progress Note-Follow Up  Subjective  Chief Complaint  Chief Complaint  Patient presents with  . Follow-up    6 month    HPI  Patient is an 78 year old Caucasian female who is in today for followup on multiple medical problems. Overall she's doing well. She's accompanied by her daughter and acknowledges not had any recent illness, fevers, headache, chest pain, palpitations, shortness of breath, GI or GU complaints. She did have an episode of vertigo which required a trip to the emergency room in March but after some treatment they sent her home without hospitalization she's had no recurrence. She is following with urology now. He has had no other new or acute concerns since she was last seen  Past Medical History  Diagnosis Date  . History of chicken pox   . Glaucoma     both eyes  . Hypertension   . Chronic kidney disease 1998    stones  . Stroke 04/2007    x 3 total  . Thyroid disease     hypothyroid  . Fibrocystic breast 01/12/2011  . Insomnia 01/13/2011  . Urinary frequency 02/09/2011  . Vertigo 02/09/2011  . Spinal stenosis 06/14/2011    Past Surgical History  Procedure Date  . Abdominal hysterectomy 1963  . Back surgery 1989    discectomy L4-5 1989, good results  . Breast surgery 1970    biopsy, benign, b/l    Family History  Problem Relation Age of Onset  . Heart disease Mother   . Hypertension Mother   . Diabetes Mother   . Heart disease Father   . Hypertension Father   . Cancer Sister     breast cancer    History   Social History  . Marital Status: Widowed    Spouse Name: N/A    Number of Children: 2  . Years of Education: N/A   Occupational History  . Not on file.   Social History Main Topics  . Smoking status: Never Smoker   . Smokeless tobacco: Never Used  . Alcohol Use: 1.8 oz/week    3 Glasses of wine per  week  . Drug Use: No  . Sexually Active: Not Currently   Other Topics Concern  . Not on file   Social History Narrative  . No narrative on file    Current Outpatient Prescriptions on File Prior to Visit  Medication Sig Dispense Refill  . latanoprost (XALATAN) 0.005 % ophthalmic solution Place 1 drop into both eyes at bedtime.        . timolol (BETIMOL) 0.5 % ophthalmic solution Place 1 drop into both eyes every morning.        Marland Kitchen DISCONTD: gabapentin (NEURONTIN) 100 MG capsule Take 1 capsule (100 mg total) by mouth at bedtime.  30 capsule  2  . DISCONTD: levothyroxine (SYNTHROID, LEVOTHROID) 75 MCG tablet Take 1 tablet (75 mcg total) by mouth daily.  90 tablet  1  . DISCONTD: lisinopril (PRINIVIL,ZESTRIL) 2.5 MG tablet Take 1 tablet (2.5 mg total) by mouth daily.  30 tablet  3    Allergies  Allergen Reactions  . Ciprofloxacin Hives    Given with 2 other meds/unsure if truly allergic  . Combigan (Brimonidine Tartrate-Timolol) Other (See Comments)    Inflamation  . Detrol (Tolterodine Tartrate) Hives    Given with 2 other medications/unsure if  truly allergic  . Oxycodone Hives    Given with 2 other medications/unsure if truly allergic  . Penicillins   . Sulfa Antibiotics     Review of Systems  Review of Systems  Constitutional: Negative for fever and malaise/fatigue.  HENT: Negative for congestion.   Eyes: Negative for discharge.  Respiratory: Negative for shortness of breath.   Cardiovascular: Negative for chest pain, palpitations and leg swelling.  Gastrointestinal: Negative for nausea, abdominal pain and diarrhea.  Genitourinary: Negative for dysuria.  Musculoskeletal: Negative for falls.  Skin: Negative for rash.  Neurological: Positive for dizziness. Negative for tingling, sensory change, speech change, focal weakness, loss of consciousness and headaches.  Endo/Heme/Allergies: Negative for polydipsia.  Psychiatric/Behavioral: Negative for depression and suicidal  ideas. The patient is not nervous/anxious and does not have insomnia.     Objective  BP 130/78  Pulse 74  Temp 97.4 F (36.3 C) (Temporal)  Ht 5' 2.75" (1.594 m)  Wt 132 lb 12.8 oz (60.238 kg)  BMI 23.71 kg/m2  SpO2 98%  Physical Exam  Physical Exam  Constitutional: She is oriented to person, place, and time and well-developed, well-nourished, and in no distress. No distress.  HENT:  Head: Normocephalic and atraumatic.  Eyes: Conjunctivae are normal.  Neck: Neck supple. No thyromegaly present.  Cardiovascular: Normal rate, regular rhythm and normal heart sounds.   Pulmonary/Chest: Effort normal and breath sounds normal. She has no wheezes.  Abdominal: She exhibits no distension and no mass.  Musculoskeletal: She exhibits no edema.  Lymphadenopathy:    She has no cervical adenopathy.  Neurological: She is alert and oriented to person, place, and time.  Skin: Skin is warm and dry. No rash noted. She is not diaphoretic.  Psychiatric: Memory, affect and judgment normal.    Lab Results  Component Value Date   TSH 0.76 02/09/2011   Lab Results  Component Value Date   WBC 6.5 12/06/2011   HGB 12.0 12/06/2011   HCT 37.8 12/06/2011   MCV 88.0 12/06/2011   PLT 290.0 12/06/2011   Lab Results  Component Value Date   CREATININE 1.0 12/06/2011   BUN 16 12/06/2011   NA 138 12/06/2011   K 4.0 12/06/2011   CL 104 12/06/2011   CO2 26 12/06/2011   Lab Results  Component Value Date   ALT 15 12/06/2011   AST 22 12/06/2011   ALKPHOS 83 12/06/2011   BILITOT 0.7 12/06/2011   Lab Results  Component Value Date   CHOL 148 01/31/2011   Lab Results  Component Value Date   HDL 46 01/31/2011   Lab Results  Component Value Date   LDLCALC 83 01/31/2011   Lab Results  Component Value Date   TRIG 93 01/31/2011   Lab Results  Component Value Date   CHOLHDL 3.2 01/31/2011     Assessment & Plan  Vertigo 1 episode back in March was mild and has not recurred  Hypertension Well controlled, no changes to  meds  Thyroid disease Check tsh today, no change in med today

## 2011-12-14 ENCOUNTER — Ambulatory Visit: Payer: Medicare Other | Admitting: Family Medicine

## 2011-12-14 LAB — TSH: TSH: 0.77 u[IU]/mL (ref 0.35–5.50)

## 2011-12-16 ENCOUNTER — Other Ambulatory Visit: Payer: Medicare Other

## 2011-12-22 LAB — FECAL OCCULT BLOOD, IMMUNOCHEMICAL: Fecal Occult Bld: NEGATIVE

## 2012-01-12 DIAGNOSIS — H4011X0 Primary open-angle glaucoma, stage unspecified: Secondary | ICD-10-CM | POA: Diagnosis not present

## 2012-01-12 DIAGNOSIS — H409 Unspecified glaucoma: Secondary | ICD-10-CM | POA: Diagnosis not present

## 2012-03-07 DIAGNOSIS — Z23 Encounter for immunization: Secondary | ICD-10-CM | POA: Diagnosis not present

## 2012-03-08 ENCOUNTER — Encounter: Payer: Self-pay | Admitting: Cardiovascular Disease

## 2012-03-19 ENCOUNTER — Other Ambulatory Visit: Payer: Self-pay

## 2012-03-19 MED ORDER — MECLIZINE HCL 25 MG PO TABS: 25.0000 mg | ORAL_TABLET | Freq: Three times a day (TID) | ORAL | Status: DC | PRN

## 2012-03-19 NOTE — Telephone Encounter (Signed)
Please advise Meclizine RX? Pts daughter in law Kriste Basque) left a message stating that the hospital gave pt an RX for Meclizine and it really helped pts dizziness? If ok send to [facility]

## 2012-03-19 NOTE — Telephone Encounter (Signed)
pts daughter in law informed and states pt took 1 last night for the first time in 2 weeks.

## 2012-03-19 NOTE — Telephone Encounter (Signed)
So if she has tolerated Meclizine in the past we can send some in but the side effects from the medication can get worse as we age so take it only as needed. If symptoms are worsening consider coming back in for reevaluation. Meclizine 25 mg tab 1 tab po q 8 hours prn vertigo, disp #30, 1 rf

## 2012-04-05 DIAGNOSIS — Z1231 Encounter for screening mammogram for malignant neoplasm of breast: Secondary | ICD-10-CM | POA: Diagnosis not present

## 2012-04-05 DIAGNOSIS — Z803 Family history of malignant neoplasm of breast: Secondary | ICD-10-CM | POA: Diagnosis not present

## 2012-05-09 DIAGNOSIS — I69998 Other sequelae following unspecified cerebrovascular disease: Secondary | ICD-10-CM | POA: Diagnosis not present

## 2012-05-09 DIAGNOSIS — G459 Transient cerebral ischemic attack, unspecified: Secondary | ICD-10-CM | POA: Diagnosis not present

## 2012-05-09 DIAGNOSIS — R209 Unspecified disturbances of skin sensation: Secondary | ICD-10-CM | POA: Diagnosis not present

## 2012-05-09 DIAGNOSIS — M5030 Other cervical disc degeneration, unspecified cervical region: Secondary | ICD-10-CM | POA: Diagnosis not present

## 2012-05-31 ENCOUNTER — Encounter: Payer: Self-pay | Admitting: Family Medicine

## 2012-05-31 ENCOUNTER — Ambulatory Visit (INDEPENDENT_AMBULATORY_CARE_PROVIDER_SITE_OTHER): Payer: Medicare Other | Admitting: Family Medicine

## 2012-05-31 VITALS — BP 147/77 | HR 99 | Temp 98.0°F | Ht 62.75 in | Wt 132.8 lb

## 2012-05-31 DIAGNOSIS — I10 Essential (primary) hypertension: Secondary | ICD-10-CM

## 2012-05-31 DIAGNOSIS — R350 Frequency of micturition: Secondary | ICD-10-CM

## 2012-05-31 DIAGNOSIS — R110 Nausea: Secondary | ICD-10-CM | POA: Insufficient documentation

## 2012-05-31 DIAGNOSIS — D72829 Elevated white blood cell count, unspecified: Secondary | ICD-10-CM

## 2012-05-31 HISTORY — DX: Nausea: R11.0

## 2012-05-31 LAB — RENAL FUNCTION PANEL
BUN: 22 mg/dL (ref 6–23)
CO2: 26 mEq/L (ref 19–32)
Chloride: 103 mEq/L (ref 96–112)
GFR: 66.92 mL/min (ref >60.00)
Phosphorus: 3.2 mg/dL (ref 2.3–4.6)
Potassium: 3.7 mEq/L (ref 3.5–5.1)

## 2012-05-31 LAB — HEPATIC FUNCTION PANEL
AST: 19 U/L (ref 0–37)
Albumin: 3.6 g/dL (ref 3.5–5.2)
Alkaline Phosphatase: 92 U/L (ref 39–117)
Bilirubin, Direct: 0 mg/dL (ref 0.0–0.3)

## 2012-05-31 LAB — CBC
HCT: 34.9 % — ABNORMAL LOW (ref 36.0–46.0)
Hemoglobin: 11.4 g/dL — ABNORMAL LOW (ref 12.0–15.0)
RBC: 4.06 Mil/uL (ref 3.87–5.11)
WBC: 6 10*3/uL (ref 4.5–10.5)

## 2012-05-31 LAB — POCT URINALYSIS DIPSTICK
Bilirubin, UA: NEGATIVE
Blood, UA: NEGATIVE
Protein, UA: NEGATIVE
pH, UA: 5.5

## 2012-05-31 LAB — H. PYLORI ANTIBODY, IGG: H Pylori IgG: NEGATIVE

## 2012-05-31 LAB — TSH: TSH: 0.92 u[IU]/mL (ref 0.35–5.50)

## 2012-05-31 MED ORDER — PROMETHAZINE HCL 12.5 MG PO TABS: 12.5000 mg | ORAL_TABLET | Freq: Three times a day (TID) | ORAL | Status: DC | PRN

## 2012-05-31 NOTE — Patient Instructions (Signed)
Ginger caps 3 x a day and at bedtime or ginger ale, snaps, tea  Call if worse Call if do not tolerate the promethazine for nausea. Consider taking Zantac/Ranitidine 150 mg dose at bedtime if nausea persists

## 2012-06-02 LAB — URINE CULTURE

## 2012-06-04 ENCOUNTER — Telehealth: Payer: Self-pay

## 2012-06-04 NOTE — Telephone Encounter (Signed)
Only finding was mild anemia

## 2012-06-04 NOTE — Progress Notes (Signed)
Patient ID: Darlene Wilson, female   DOB: [birth year], 79 y.o.   MRN: [id number] Darlene Wilson [id number] 1927/01/01 06/04/2012      Progress Note-Follow Up  Subjective  Chief Complaint  Chief Complaint  Patient presents with  . Nausea    for several weeks    HPI   patient is an 79 year old Caucasian female who is in today complaining of several weeks with nausea. No vomiting or anorexia. No change in her bowel habits such as constipation or diarrhea. No fevers, chills, malaise, myalgias or noted. No chest pain, palpitations, shortness of breath or obvious heartburn or sore throat is noted. She says the nausea is not constant can come and go but is present more than it has not. Does not correlate with eating or not eating as far she can tell. No other signs of illness. There was some fatigue just prior to the nausea onset but that the only other symptom she can come up with. No urinary symptoms such as frequency or urgency or noted.  Past Medical History  Diagnosis Date  . History of chicken pox   . Glaucoma(365)     both eyes  . Hypertension   . Chronic kidney disease 1998    stones  . Stroke 04/2007    x 3 total  . Thyroid disease     hypothyroid  . Fibrocystic breast 01/12/2011  . Insomnia 01/13/2011  . Urinary frequency 02/09/2011  . Vertigo 02/09/2011  . Spinal stenosis 06/14/2011  . Nausea 05/31/2012    Past Surgical History  Procedure Date  . Abdominal hysterectomy 1963  . Back surgery 1989    discectomy L4-5 1989, good results  . Breast surgery 1970    biopsy, benign, b/l    Family History  Problem Relation Age of Onset  . Heart disease Mother   . Hypertension Mother   . Diabetes Mother   . Heart disease Father   . Hypertension Father   . Cancer Sister     breast cancer    History   Social History  . Marital Status: Widowed    Spouse Name: N/A    Number of Children: 2  . Years of Education: N/A   Occupational History  . Not on file.   Social  History Main Topics  . Smoking status: Never Smoker   . Smokeless tobacco: Never Used  . Alcohol Use: 1.8 oz/week    3 Glasses of wine per week  . Drug Use: No  . Sexually Active: Not Currently   Other Topics Concern  . Not on file   Social History Narrative  . No narrative on file    Current Outpatient Prescriptions on File Prior to Visit  Medication Sig Dispense Refill  . Cholecalciferol (VITAMIN D-3 PO) Take 1 tablet by mouth daily.      . Cyanocobalamin (B-12 PO) Take by mouth daily.      Marland Kitchen dipyridamole-aspirin (AGGRENOX) 200-25 MG per 12 hr capsule Take 1 capsule by mouth 2 (two) times daily.  180 capsule  3  . fish oil-omega-3 fatty acids 1000 MG capsule Take 2 g by mouth daily.      Marland Kitchen gabapentin (NEURONTIN) 100 MG capsule Take 1 capsule (100 mg total) by mouth at bedtime.  90 capsule  3  . latanoprost (XALATAN) 0.005 % ophthalmic solution Place 1 drop into both eyes at bedtime.        Marland Kitchen levothyroxine (SYNTHROID, LEVOTHROID) 75 MCG tablet Take 1 tablet (75 mcg  total) by mouth daily.  90 tablet  3  . lisinopril (PRINIVIL,ZESTRIL) 2.5 MG tablet Take 1 tablet (2.5 mg total) by mouth daily.  90 tablet  3  . timolol (BETIMOL) 0.5 % ophthalmic solution Place 1 drop into both eyes every morning.        . meclizine (ANTIVERT) 25 MG tablet Take 1 tablet (25 mg total) by mouth every 8 (eight) hours as needed.  30 tablet  1  . promethazine (PHENERGAN) 12.5 MG tablet Take 1 tablet (12.5 mg total) by mouth every 8 (eight) hours as needed for nausea.  20 tablet  0    Allergies  Allergen Reactions  . Ciprofloxacin Hives    Given with 2 other meds/unsure if truly allergic  . Combigan (Brimonidine Tartrate-Timolol) Other (See Comments)    Inflamation  . Detrol (Tolterodine Tartrate) Hives    Given with 2 other medications/unsure if truly allergic  . Oxycodone Hives    Given with 2 other medications/unsure if truly allergic  . Penicillins   . Sulfa Antibiotics     Review of  Systems  Review of Systems  Constitutional: Negative for fever and malaise/fatigue.  HENT: Negative for congestion.   Eyes: Negative for discharge.  Respiratory: Negative for shortness of breath.   Cardiovascular: Negative for chest pain, palpitations and leg swelling.  Gastrointestinal: Positive for nausea. Negative for heartburn, vomiting, abdominal pain and diarrhea.  Genitourinary: Negative for dysuria.  Musculoskeletal: Negative for falls.  Skin: Negative for rash.  Neurological: Negative for loss of consciousness and headaches.  Endo/Heme/Allergies: Negative for polydipsia.  Psychiatric/Behavioral: Negative for depression and suicidal ideas. The patient is not nervous/anxious and does not have insomnia.     Objective  BP 147/77  Pulse 99  Temp 98 F (36.7 C) (Temporal)  Ht 5' 2.75" (1.594 m)  Wt 132 lb 12.8 oz (60.238 kg)  BMI 23.71 kg/m2  SpO2 99%  Physical Exam  Physical Exam  Constitutional: She is oriented to person, place, and time and well-developed, well-nourished, and in no distress. No distress.  HENT:  Head: Normocephalic and atraumatic.  Eyes: Conjunctivae normal are normal.  Neck: Neck supple. No thyromegaly present.  Cardiovascular: Normal rate, regular rhythm and normal heart sounds.   No murmur heard. Pulmonary/Chest: Effort normal and breath sounds normal. She has no wheezes.  Abdominal: She exhibits no distension and no mass.  Musculoskeletal: She exhibits no edema.  Lymphadenopathy:    She has no cervical adenopathy.  Neurological: She is alert and oriented to person, place, and time.  Skin: Skin is warm and dry. No rash noted. She is not diaphoretic.  Psychiatric: Memory, affect and judgment normal.    Lab Results  Component Value Date   TSH 0.92 05/31/2012   Lab Results  Component Value Date   WBC 6.0 05/31/2012   HGB 11.4* 05/31/2012   HCT 34.9* 05/31/2012   MCV 86.1 05/31/2012   PLT 274.0 05/31/2012   Lab Results  Component Value Date    CREATININE 0.9 05/31/2012   BUN 22 05/31/2012   NA 136 05/31/2012   K 3.7 05/31/2012   CL 103 05/31/2012   CO2 26 05/31/2012   Lab Results  Component Value Date   ALT 12 05/31/2012   AST 19 05/31/2012   ALKPHOS 92 05/31/2012   BILITOT 0.5 05/31/2012   Lab Results  Component Value Date   CHOL 148 01/31/2011   Lab Results  Component Value Date   HDL 46 01/31/2011   Lab Results  Component Value Date   LDLCALC 83 01/31/2011   Lab Results  Component Value Date   TRIG 93 01/31/2011   Lab Results  Component Value Date   CHOLHDL 3.2 01/31/2011     Assessment & Plan  Nausea Persistent times several weeks. No anorexia or vomiting. He did have an episode of increased fatigue just prior to the onset. Does continue to eat or drink normally. No abdominal pain or other associated symptoms. Will have her rest her GI tract 4 hours with clear fluids and then progress to [employer]. May try ginger and is given promethazine to use prn. Labs unremarkable today consider further workup if no improvement in  Hypertension We controlled, no changes

## 2012-06-04 NOTE — Telephone Encounter (Signed)
Notify him of results if we have permission to speak with him

## 2012-06-04 NOTE — Assessment & Plan Note (Signed)
Persistent times several weeks. No anorexia or vomiting. He did have an episode of increased fatigue just prior to the onset. Does continue to eat or drink normally. No abdominal pain or other associated symptoms. Will have her rest her GI tract 4 hours with clear fluids and then progress to [employer]. May try ginger and is given promethazine to use prn. Labs unremarkable today consider further workup if no improvement in

## 2012-06-04 NOTE — Assessment & Plan Note (Signed)
We controlled, no changes

## 2012-06-04 NOTE — Telephone Encounter (Signed)
pts son informed.

## 2012-06-04 NOTE — Telephone Encounter (Signed)
pts son is calling asking for his moms lab results. Please advise?   Fayrene Fearing # [phone]

## 2012-06-05 NOTE — Progress Notes (Signed)
Quick Note:  Patient Informed and voiced understanding ______ 

## 2012-06-13 ENCOUNTER — Telehealth: Payer: Self-pay | Admitting: Family Medicine

## 2012-06-13 ENCOUNTER — Ambulatory Visit: Payer: Medicare Other | Admitting: Family Medicine

## 2012-06-13 NOTE — Telephone Encounter (Signed)
So her labs at the beginning of January were essentially just acute labs looking for causes for nausea. They were all fairly good although she did have mild anemia we need to recheck on. He did not want her fasting last relapse of his cholesterol and sugar numbers. I do recommend does be done in the next couple of months although it does not have to be spot on in a month to 3. I would have her come in for cholesterol, hemoglobin A1c and her regular labs. Hopefully she is feeling better, if not she should definitely come in

## 2012-06-13 NOTE — Telephone Encounter (Signed)
[name] informed and states she will just keep the appts.

## 2012-07-02 ENCOUNTER — Other Ambulatory Visit (INDEPENDENT_AMBULATORY_CARE_PROVIDER_SITE_OTHER): Payer: Medicare Other

## 2012-07-02 ENCOUNTER — Other Ambulatory Visit: Payer: Self-pay | Admitting: Family Medicine

## 2012-07-02 DIAGNOSIS — I10 Essential (primary) hypertension: Secondary | ICD-10-CM

## 2012-07-02 DIAGNOSIS — E079 Disorder of thyroid, unspecified: Secondary | ICD-10-CM | POA: Diagnosis not present

## 2012-07-02 DIAGNOSIS — R110 Nausea: Secondary | ICD-10-CM

## 2012-07-02 LAB — CBC
Hemoglobin: 11.2 g/dL — ABNORMAL LOW (ref 12.0–15.0)
Platelets: 308 10*3/uL (ref 150.0–400.0)
RDW: 17.2 % — ABNORMAL HIGH (ref 11.5–14.6)
WBC: 5.7 10*3/uL (ref 4.5–10.5)

## 2012-07-02 LAB — RENAL FUNCTION PANEL
CO2: 28 mEq/L (ref 19–32)
Calcium: 8.9 mg/dL (ref 8.4–10.5)
Chloride: 105 mEq/L (ref 96–112)
Creatinine, Ser: 0.9 mg/dL (ref 0.4–1.2)
GFR: 65.15 mL/min (ref >60.00)
Potassium: 4 mEq/L (ref 3.5–5.1)
Sodium: 139 mEq/L (ref 135–145)

## 2012-07-02 LAB — HEPATIC FUNCTION PANEL
ALT: 15 U/L (ref 0–35)
AST: 23 U/L (ref 0–37)
Alkaline Phosphatase: 84 U/L (ref 39–117)
Bilirubin, Direct: 0 mg/dL (ref 0.0–0.3)
Total Bilirubin: 0.3 mg/dL (ref 0.3–1.2)

## 2012-07-02 LAB — TSH: TSH: 0.58 u[IU]/mL (ref 0.35–5.50)

## 2012-07-02 MED ORDER — PROMETHAZINE HCL 12.5 MG PO TABS: 12.5000 mg | ORAL_TABLET | Freq: Three times a day (TID) | ORAL | Status: DC | PRN

## 2012-07-02 NOTE — Progress Notes (Signed)
Labs only

## 2012-07-02 NOTE — Telephone Encounter (Signed)
RX sent

## 2012-07-09 ENCOUNTER — Ambulatory Visit: Payer: Medicare Other | Admitting: Family Medicine

## 2012-07-09 ENCOUNTER — Ambulatory Visit (INDEPENDENT_AMBULATORY_CARE_PROVIDER_SITE_OTHER): Payer: Medicare Other | Admitting: Family Medicine

## 2012-07-09 ENCOUNTER — Encounter: Payer: Self-pay | Admitting: Family Medicine

## 2012-07-09 VITALS — BP 140/74 | HR 83 | Temp 98.4°F | Ht 62.75 in | Wt 137.0 lb

## 2012-07-09 DIAGNOSIS — I10 Essential (primary) hypertension: Secondary | ICD-10-CM

## 2012-07-09 DIAGNOSIS — I6350 Cerebral infarction due to unspecified occlusion or stenosis of unspecified cerebral artery: Secondary | ICD-10-CM

## 2012-07-09 DIAGNOSIS — R110 Nausea: Secondary | ICD-10-CM | POA: Diagnosis not present

## 2012-07-09 DIAGNOSIS — E079 Disorder of thyroid, unspecified: Secondary | ICD-10-CM

## 2012-07-09 DIAGNOSIS — I639 Cerebral infarction, unspecified: Secondary | ICD-10-CM

## 2012-07-09 NOTE — Patient Instructions (Addendum)
Switch from fish oil to [employer] caps daily to help Probiotics as needed for antibiotic use or any illness, Align, [employer]age, [employer] colon health Cook in cast iron   Labs prior to next visit, cbc, iron studies, vitamin B 12 level   Anemia, Frequently Asked Questions WHAT ARE THE SYMPTOMS OF ANEMIA?  Headache.  Difficulty thinking.  Fatigue.  Shortness of breath.  Weakness.  Rapid heartbeat. AT WHAT POINT ARE PEOPLE CONSIDERED ANEMIC?  This varies with gender and age.   Both hemoglobin (Hgb) and hematocrit values are used to define anemia. These lab values are obtained from a complete blood count (CBC) test. This is performed at a caregiver's office.  The normal range of hemoglobin values for adult men is 14.0 g/dL to 40.9 g/dL. For nonpregnant women, values are 12.3 g/dL to 81.1 g/dL.  The World Health Organization defines anemia as less than 12 g/dL for nonpregnant women and less than 13 g/dL for men.  For adult males, the average normal hematocrit is 46%, and the range is 40% to 52%.  For adult females, the average normal hematocrit is 41%, and the range is 35% to 47%.  Values that fall below the lower limits can be a sign of anemia and should have further checking (evaluation). GROUPS OF PEOPLE WHO ARE AT RISK FOR DEVELOPING ANEMIA INCLUDE:   Infants who are breastfed or taking a formula that is not fortified with iron.  Children going through a rapid growth spurt. The iron available can not keep up with the needs for a red cell mass which must grow with the child.  Women in childbearing years. They need iron because of blood loss during menstruation.  Pregnant women. The growing fetus creates a high demand for iron.  People with ongoing gastrointestinal blood loss are at risk of developing iron deficiency.  Individuals with leukemia or cancer who must receive chemotherapy or radiation to treat their disease. The drugs or radiation used to treat these  diseases often decreases the bone marrow's ability to make cells of all classes. This includes red blood cells, white blood cells, and platelets.  Individuals with chronic inflammatory conditions such as rheumatoid arthritis or chronic infections.  The elderly. ARE SOME TYPES OF ANEMIA INHERITED?   Yes, some types of anemia are due to inherited or genetic defects.  Sickle cell anemia. This occurs most often in people of African, African American, and Mediterranean descent.  Thalassemia (or Cooley's anemia). This type is found in people of Mediterranean and Southeast Asian descent. These types of anemia are common.  [place]. This is rare. CAN CERTAIN MEDICATIONS CAUSE A PERSON TO BECOME ANEMIC?  Yes. For example, drugs to fight cancer (chemotherapeutic agents) often cause anemia. These drugs can slow the bone marrow's ability to make red blood cells. If there are not enough red blood cells, the body does not get enough oxygen. WHAT HEMATOCRIT LEVEL IS REQUIRED TO DONATE BLOOD?  The lower limit of an acceptable hematocrit for blood donors is 38%. If you have a low hematocrit value, you should schedule an appointment with your caregiver. ARE BLOOD TRANSFUSIONS COMMONLY USED TO CORRECT ANEMIA, AND ARE THEY DANGEROUS?  They are used to treat anemia as a last resort. Your caregiver will find the cause of the anemia and correct it if possible. Most blood transfusions are given because of excessive bleeding at the time of surgery, with trauma, or because of bone marrow suppression in patients with cancer or leukemia on chemotherapy. Blood transfusions  are safer than ever before. We also know that blood transfusions affect the immune system and may increase certain risks. There is also a concern for human error. In 1/16,000 transfusions, a patient receives a transfusion of blood that is not matched with his or her blood type.  WHAT IS IRON DEFICIENCY ANEMIA AND CAN I CORRECT IT BY CHANGING MY DIET?  Iron  is an essential part of hemoglobin. Without enough hemoglobin, anemia develops and the body does not get the right amount of oxygen. Iron deficiency anemia develops after the body has had a low level of iron for a long time. This is either caused by blood loss, not taking in or absorbing enough iron, or increased demands for iron (like pregnancy or rapid growth).  Foods from animal origin such as beef, chicken, and pork, are good sources of iron. Be sure to have one of these foods at each meal. Vitamin C helps your body absorb iron. Foods rich in Vitamin C include citrus, bell pepper, strawberries, spinach and cantaloupe. In some cases, iron supplements may be needed in order to correct the iron deficiency. In the case of poor absorption, extra iron may have to be given directly into the vein through a needle (intravenously). I HAVE BEEN DIAGNOSED WITH IRON DEFICIENCY ANEMIA AND MY CAREGIVER PRESCRIBED IRON SUPPLEMENTS. HOW LONG WILL IT TAKE FOR MY BLOOD TO BECOME NORMAL?  It depends on the degree of anemia at the beginning of treatment. Most people with mild to moderate iron deficiency, anemia will correct the anemia over a period of 2 to 3 months. But after the anemia is corrected, the iron stored by the body is still low. Caregivers often suggest an additional 6 months of oral iron therapy once the anemia has been reversed. This will help prevent the iron deficiency anemia from quickly happening again. Non-anemic adult males should take iron supplements only under the direction of a doctor, too much iron can cause liver damage.  MY HEMOGLOBIN IS 9 G/DL AND I AM SCHEDULED FOR SURGERY. SHOULD I POSTPONE THE SURGERY?  If you have Hgb of 9, you should discuss this with your caregiver right away. Many patients with similar hemoglobin levels have had surgery without problems. If minimal blood loss is expected for a minor procedure, no treatment may be necessary.  If a greater blood loss is expected for more  extensive procedures, you should ask your caregiver about being treated with erythropoietin and iron. This is to accelerate the recovery of your hemoglobin to a normal level before surgery. An anemic patient who undergoes high-blood-loss surgery has a greater risk of surgical complications and need for a blood transfusion, which also carries some risk.  I HAVE BEEN TOLD THAT HEAVY MENSTRUAL PERIODS CAUSE ANEMIA. IS THERE ANYTHING I CAN DO TO PREVENT THE ANEMIA?  Anemia that results from heavy periods is usually due to iron deficiency. You can try to meet the increased demands for iron caused by the heavy monthly blood loss by increasing the intake of iron-rich foods. Iron supplements may be required. Discuss your concerns with your caregiver. WHAT CAUSES ANEMIA DURING PREGNANCY?  Pregnancy places major demands on the body. The mother must meet the needs of both her body and her growing baby. The body needs enough iron and folate to make the right amount of red blood cells. To prevent anemia while pregnant, the mother should stay in close contact with her caregiver.  Be sure to eat a diet that has foods rich in  iron and folate like liver and dark green leafy vegetables. Folate plays an important role in the normal development of a baby's spinal cord. Folate can help prevent serious disorders like spina bifida. If your diet does not provide adequate nutrients, you may want to talk with your caregiver about nutritional supplements.  WHAT IS THE RELATIONSHIP BETWEEN FIBROID TUMORS AND ANEMIA IN WOMEN?  The relationship is usually caused by the increased menstrual blood loss caused by fibroids. Good iron intake may be required to prevent iron deficiency anemia from developing.  Document Released: 12/23/2003 Document Revised: 08/08/2011 Document Reviewed: 06/08/2010 [facility] Patient Information 2013 [place], [place].

## 2012-07-15 NOTE — Progress Notes (Signed)
Patient ID: Darlene Wilson, female   DOB: 02/04/1927, 79 y.o.   MRN: [id number] Darlene Wilson [id number] 1926-08-15 07/15/2012      Progress Note-Follow Up  Subjective  Chief Complaint  Chief Complaint  Patient presents with  . Follow-up    6-7 month    HPI  Patient is an 79 year old female in today for followup. Doing much better. Nausea is now resolved and is been no vomiting. Appetite is better she is eating more regularly. She's had no recent neurologic episodes and is following with neurology. Her daughter with this with her and confirms she's feeling well. No chest pain or palpitations. No shortness of breath, fevers, headache, GU complaints noted today.  Past Medical History  Diagnosis Date  . History of chicken pox   . Glaucoma(365)     both eyes  . Hypertension   . Chronic kidney disease 1998    stones  . Stroke 04/2007    x 3 total  . Thyroid disease     hypothyroid  . Fibrocystic breast 01/12/2011  . Insomnia 01/13/2011  . Urinary frequency 02/09/2011  . Vertigo 02/09/2011  . Spinal stenosis 06/14/2011  . Nausea 05/31/2012    Past Surgical History  Procedure Laterality Date  . Abdominal hysterectomy  1963  . Back surgery  1989    discectomy L4-5 1989, good results  . Breast surgery  1970    biopsy, benign, b/l    Family History  Problem Relation Age of Onset  . Heart disease Mother   . Hypertension Mother   . Diabetes Mother   . Heart disease Father   . Hypertension Father   . Cancer Sister     breast cancer    History   Social History  . Marital Status: Widowed    Spouse Name: N/A    Number of Children: 2  . Years of Education: N/A   Occupational History  . Not on file.   Social History Main Topics  . Smoking status: Never Smoker   . Smokeless tobacco: Never Used  . Alcohol Use: 1.8 oz/week    3 Glasses of wine per week  . Drug Use: No  . Sexually Active: Not Currently   Other Topics Concern  . Not on file   Social History  Narrative  . No narrative on file    Current Outpatient Prescriptions on File Prior to Visit  Medication Sig Dispense Refill  . Cholecalciferol (VITAMIN D-3 PO) Take 1 tablet by mouth daily.      . Cyanocobalamin (B-12 PO) Take by mouth daily.      Marland Kitchen dipyridamole-aspirin (AGGRENOX) 200-25 MG per 12 hr capsule Take 1 capsule by mouth 2 (two) times daily.  180 capsule  3  . fish oil-omega-3 fatty acids 1000 MG capsule Take 2 g by mouth daily.      Marland Kitchen gabapentin (NEURONTIN) 100 MG capsule Take 1 capsule (100 mg total) by mouth at bedtime.  90 capsule  3  . latanoprost (XALATAN) 0.005 % ophthalmic solution Place 1 drop into both eyes at bedtime.        Marland Kitchen levothyroxine (SYNTHROID, LEVOTHROID) 75 MCG tablet Take 1 tablet (75 mcg total) by mouth daily.  90 tablet  3  . lisinopril (PRINIVIL,ZESTRIL) 2.5 MG tablet Take 1 tablet (2.5 mg total) by mouth daily.  90 tablet  3  . meclizine (ANTIVERT) 25 MG tablet Take 1 tablet (25 mg total) by mouth every 8 (eight) hours as needed.  30  tablet  1  . promethazine (PHENERGAN) 12.5 MG tablet Take 1 tablet (12.5 mg total) by mouth every 8 (eight) hours as needed for nausea.  20 tablet  0  . timolol (BETIMOL) 0.5 % ophthalmic solution Place 1 drop into both eyes every morning.         No current facility-administered medications on file prior to visit.    Allergies  Allergen Reactions  . Ciprofloxacin Hives    Given with 2 other meds/unsure if truly allergic  . Combigan (Brimonidine Tartrate-Timolol) Other (See Comments)    Inflamation  . Detrol (Tolterodine Tartrate) Hives    Given with 2 other medications/unsure if truly allergic  . Oxycodone Hives    Given with 2 other medications/unsure if truly allergic  . Penicillins   . Sulfa Antibiotics     Review of Systems  Review of Systems  Constitutional: Negative for fever and malaise/fatigue.  HENT: Negative for congestion.   Eyes: Negative for discharge.  Respiratory: Negative for shortness of  breath.   Cardiovascular: Negative for chest pain, palpitations and leg swelling.  Gastrointestinal: Negative for heartburn, nausea, vomiting, abdominal pain, diarrhea, constipation, blood in stool and melena.  Genitourinary: Negative for dysuria, urgency and frequency.  Musculoskeletal: Negative for falls.  Skin: Negative for rash.  Neurological: Negative for loss of consciousness and headaches.  Endo/Heme/Allergies: Negative for polydipsia.  Psychiatric/Behavioral: Negative for depression and suicidal ideas. The patient is not nervous/anxious and does not have insomnia.     Objective  BP 140/74  Pulse 83  Temp(Src) 98.4 F (36.9 C) (Oral)  Ht 5' 2.75" (1.594 m)  Wt 137 lb 0.6 oz (62.161 kg)  BMI 24.46 kg/m2  SpO2 93%  Physical Exam  Physical Exam  Constitutional: She is oriented to person, place, and time and well-developed, well-nourished, and in no distress. No distress.  HENT:  Head: Normocephalic and atraumatic.  Eyes: Conjunctivae are normal.  Neck: Neck supple. No thyromegaly present.  Cardiovascular: Normal rate, regular rhythm and normal heart sounds.   No murmur heard. Pulmonary/Chest: Effort normal and breath sounds normal. She has no wheezes.  Abdominal: She exhibits no distension and no mass.  Musculoskeletal: She exhibits no edema.  Lymphadenopathy:    She has no cervical adenopathy.  Neurological: She is alert and oriented to person, place, and time.  Skin: Skin is warm and dry. No rash noted. She is not diaphoretic.  Psychiatric: Memory, affect and judgment normal.    Lab Results  Component Value Date   TSH 0.58 07/02/2012   Lab Results  Component Value Date   WBC 5.7 07/02/2012   HGB 11.2* 07/02/2012   HCT 35.4* 07/02/2012   MCV 86.1 07/02/2012   PLT 308.0 07/02/2012   Lab Results  Component Value Date   CREATININE 0.9 07/02/2012   BUN 13 07/02/2012   NA 139 07/02/2012   K 4.0 07/02/2012   CL 105 07/02/2012   CO2 28 07/02/2012   Lab Results  Component Value  Date   ALT 15 07/02/2012   AST 23 07/02/2012   ALKPHOS 84 07/02/2012   BILITOT 0.3 07/02/2012   Lab Results  Component Value Date   CHOL 148 01/31/2011   Lab Results  Component Value Date   HDL 46 01/31/2011   Lab Results  Component Value Date   LDLCALC 83 01/31/2011   Lab Results  Component Value Date   TRIG 93 01/31/2011   Lab Results  Component Value Date   CHOLHDL 3.2 01/31/2011  Assessment & Plan  Nausea Improved since last visit, appetite better and no need for meds recently.  Hypertension Well controlled, no changes to meds  Thyroid disease Stable on current dose of Levothyroxine  Stroke Following with Neuro, no recent episodes

## 2012-07-15 NOTE — Assessment & Plan Note (Signed)
Improved since last visit, appetite better and no need for meds recently.

## 2012-07-15 NOTE — Assessment & Plan Note (Signed)
Following with Neuro, no recent episodes

## 2012-07-15 NOTE — Assessment & Plan Note (Signed)
Well controlled, no changes to meds 

## 2012-07-15 NOTE — Assessment & Plan Note (Signed)
Stable on current dose of Levothyroxine 

## 2012-07-16 ENCOUNTER — Other Ambulatory Visit: Payer: Self-pay | Admitting: Family Medicine

## 2012-07-19 DIAGNOSIS — H409 Unspecified glaucoma: Secondary | ICD-10-CM | POA: Diagnosis not present

## 2012-10-01 ENCOUNTER — Other Ambulatory Visit: Payer: Self-pay | Admitting: Family Medicine

## 2012-10-01 NOTE — Telephone Encounter (Signed)
Please advise refill request for phenergan? Last filled on 07/16/12.

## 2012-10-09 ENCOUNTER — Ambulatory Visit (INDEPENDENT_AMBULATORY_CARE_PROVIDER_SITE_OTHER): Payer: Medicare Other | Admitting: Family Medicine

## 2012-10-09 ENCOUNTER — Encounter: Payer: Self-pay | Admitting: Family Medicine

## 2012-10-09 VITALS — BP 106/68 | HR 80 | Temp 98.1°F | Ht 62.75 in | Wt 140.0 lb

## 2012-10-09 DIAGNOSIS — E079 Disorder of thyroid, unspecified: Secondary | ICD-10-CM

## 2012-10-09 DIAGNOSIS — D649 Anemia, unspecified: Secondary | ICD-10-CM

## 2012-10-09 DIAGNOSIS — I10 Essential (primary) hypertension: Secondary | ICD-10-CM | POA: Diagnosis not present

## 2012-10-09 DIAGNOSIS — R413 Other amnesia: Secondary | ICD-10-CM

## 2012-10-09 LAB — CBC
HCT: 34.5 % — ABNORMAL LOW (ref 36.0–46.0)
Hemoglobin: 10.9 g/dL — ABNORMAL LOW (ref 12.0–15.0)
MCH: 26 pg (ref 26.0–34.0)
MCHC: 31.6 g/dL (ref 30.0–36.0)

## 2012-10-09 NOTE — Patient Instructions (Addendum)

## 2012-10-09 NOTE — Progress Notes (Signed)
Patient ID: Darlene Wilson, female   DOB: 09/30/1926, 79 y.o.   MRN: [id number] MOANI WEIPERT [id number] January 31, 1927 10/09/2012      Progress Note-Follow Up  Subjective  Chief Complaint  Chief Complaint  Patient presents with  . Follow-up    3 month    HPI  Patient is a 79 year old female who is in today for followup. Generally doing well. Was struggling with some palpitations but they have lessened in frequency. Are occurring only frequently and there is no associated symptoms. No chest pain, palpitations, shortness of breath, GI or GU concerns. No recent illness or headaches  Past Medical History  Diagnosis Date  . History of chicken pox   . Glaucoma(365)     both eyes  . Hypertension   . Chronic kidney disease 1998    stones  . Stroke 04/2007    x 3 total  . Thyroid disease     hypothyroid  . Fibrocystic breast 01/12/2011  . Insomnia 01/13/2011  . Urinary frequency 02/09/2011  . Vertigo 02/09/2011  . Spinal stenosis 06/14/2011  . Nausea 05/31/2012    Past Surgical History  Procedure Laterality Date  . Abdominal hysterectomy  1963  . Back surgery  1989    discectomy L4-5 1989, good results  . Breast surgery  1970    biopsy, benign, b/l    Family History  Problem Relation Age of Onset  . Heart disease Mother   . Hypertension Mother   . Diabetes Mother   . Heart disease Father   . Hypertension Father   . Cancer Sister     breast cancer    History   Social History  . Marital Status: Widowed    Spouse Name: N/A    Number of Children: 2  . Years of Education: N/A   Occupational History  . Not on file.   Social History Main Topics  . Smoking status: Never Smoker   . Smokeless tobacco: Never Used  . Alcohol Use: 1.8 oz/week    3 Glasses of wine per week  . Drug Use: No  . Sexually Active: Not Currently   Other Topics Concern  . Not on file   Social History Narrative  . No narrative on file    Current Outpatient Prescriptions on File Prior to  Visit  Medication Sig Dispense Refill  . Cholecalciferol (VITAMIN D-3 PO) Take 1 tablet by mouth daily.      . Cyanocobalamin (B-12 PO) Take by mouth daily.      Marland Kitchen dipyridamole-aspirin (AGGRENOX) 200-25 MG per 12 hr capsule Take 1 capsule by mouth 2 (two) times daily.  180 capsule  3  . fish oil-omega-3 fatty acids 1000 MG capsule Take 2 g by mouth daily.      Marland Kitchen gabapentin (NEURONTIN) 100 MG capsule Take 1 capsule (100 mg total) by mouth at bedtime.  90 capsule  3  . latanoprost (XALATAN) 0.005 % ophthalmic solution Place 1 drop into both eyes at bedtime.        Marland Kitchen levothyroxine (SYNTHROID, LEVOTHROID) 75 MCG tablet Take 1 tablet (75 mcg total) by mouth daily.  90 tablet  3  . lisinopril (PRINIVIL,ZESTRIL) 2.5 MG tablet Take 1 tablet (2.5 mg total) by mouth daily.  90 tablet  3  . meclizine (ANTIVERT) 25 MG tablet Take 1 tablet (25 mg total) by mouth every 8 (eight) hours as needed.  30 tablet  1  . promethazine (PHENERGAN) 12.5 MG tablet Take 1 tablet (12.5 mg  total) by mouth every 8 (eight) hours as needed for nausea.  20 tablet  0  . timolol (BETIMOL) 0.5 % ophthalmic solution Place 1 drop into both eyes every morning.         No current facility-administered medications on file prior to visit.    Allergies  Allergen Reactions  . Ciprofloxacin Hives    Given with 2 other meds/unsure if truly allergic  . Combigan (Brimonidine Tartrate-Timolol) Other (See Comments)    Inflamation  . Detrol (Tolterodine Tartrate) Hives    Given with 2 other medications/unsure if truly allergic  . Oxycodone Hives    Given with 2 other medications/unsure if truly allergic  . Penicillins   . Sulfa Antibiotics     Review of Systems  Review of Systems  Constitutional: Negative for fever and malaise/fatigue.  HENT: Negative for congestion.   Eyes: Negative for discharge.  Respiratory: Negative for shortness of breath.   Cardiovascular: Positive for palpitations. Negative for chest pain and leg  swelling.  Gastrointestinal: Negative for nausea, abdominal pain and diarrhea.  Genitourinary: Negative for dysuria.  Musculoskeletal: Negative for falls.  Skin: Negative for rash.  Neurological: Negative for loss of consciousness and headaches.  Endo/Heme/Allergies: Negative for polydipsia.  Psychiatric/Behavioral: Negative for depression and suicidal ideas. The patient is not nervous/anxious and does not have insomnia.     Objective  BP 106/68  Pulse 80  Temp(Src) 98.1 F (36.7 C) (Oral)  Ht 5' 2.75" (1.594 m)  Wt 140 lb 0.6 oz (63.522 kg)  BMI 25 kg/m2  SpO2 97%  Physical Exam  Physical Exam  Constitutional: She is oriented to person, place, and time and well-developed, well-nourished, and in no distress. No distress.  HENT:  Head: Normocephalic and atraumatic.  Eyes: Conjunctivae are normal.  Neck: Neck supple. No thyromegaly present.  Cardiovascular: Normal rate, regular rhythm and normal heart sounds.   No murmur heard. Pulmonary/Chest: Effort normal and breath sounds normal. She has no wheezes.  Abdominal: She exhibits no distension and no mass.  Musculoskeletal: She exhibits no edema.  Lymphadenopathy:    She has no cervical adenopathy.  Neurological: She is alert and oriented to person, place, and time.  Skin: Skin is warm and dry. No rash noted. She is not diaphoretic.  Psychiatric: Memory, affect and judgment normal.    Lab Results  Component Value Date   TSH 0.58 07/02/2012   Lab Results  Component Value Date   WBC 5.7 07/02/2012   HGB 11.2* 07/02/2012   HCT 35.4* 07/02/2012   MCV 86.1 07/02/2012   PLT 308.0 07/02/2012   Lab Results  Component Value Date   CREATININE 0.9 07/02/2012   BUN 13 07/02/2012   NA 139 07/02/2012   K 4.0 07/02/2012   CL 105 07/02/2012   CO2 28 07/02/2012   Lab Results  Component Value Date   ALT 15 07/02/2012   AST 23 07/02/2012   ALKPHOS 84 07/02/2012   BILITOT 0.3 07/02/2012   Lab Results  Component Value Date   CHOL 148 01/31/2011   Lab  Results  Component Value Date   HDL 46 01/31/2011   Lab Results  Component Value Date   LDLCALC 83 01/31/2011   Lab Results  Component Value Date   TRIG 93 01/31/2011   Lab Results  Component Value Date   CHOLHDL 3.2 01/31/2011     Assessment & Plan  Hypertension Well controlled, no changes  Thyroid disease Well controlled on current dose of Levothyroxine  Memory  loss Accompanied by daughter. Family is concerned that she is starting to have trouble with short term memory and ADLs they are trying to convince to her move to a assisted living facility. She will need a Mini mental Status exam at the next visit.

## 2012-10-10 MED ORDER — FERROUS FUMARATE-FOLIC ACID 324-1 MG PO TABS: 324.0000 mg | ORAL_TABLET | Freq: Every day | ORAL | Status: DC

## 2012-10-12 ENCOUNTER — Encounter: Payer: Self-pay | Admitting: Family Medicine

## 2012-10-12 DIAGNOSIS — R413 Other amnesia: Secondary | ICD-10-CM

## 2012-10-12 HISTORY — DX: Other amnesia: R41.3

## 2012-10-12 NOTE — Assessment & Plan Note (Signed)
Well controlled on current dose of Levothyroxine. 

## 2012-10-12 NOTE — Assessment & Plan Note (Signed)
Accompanied by daughter. Family is concerned that she is starting to have trouble with short term memory and ADLs they are trying to convince to her move to a assisted living facility. She will need a Mini mental Status exam at the next visit.

## 2012-10-12 NOTE — Assessment & Plan Note (Signed)
Well controlled, no changes 

## 2012-11-08 ENCOUNTER — Ambulatory Visit: Payer: Medicare Other | Admitting: Neurology

## 2012-11-08 ENCOUNTER — Encounter: Payer: Self-pay | Admitting: Neurology

## 2012-11-08 DIAGNOSIS — IMO0002 Reserved for concepts with insufficient information to code with codable children: Secondary | ICD-10-CM | POA: Insufficient documentation

## 2012-11-08 DIAGNOSIS — I69998 Other sequelae following unspecified cerebrovascular disease: Secondary | ICD-10-CM | POA: Insufficient documentation

## 2012-11-08 DIAGNOSIS — R209 Unspecified disturbances of skin sensation: Secondary | ICD-10-CM | POA: Insufficient documentation

## 2012-11-08 DIAGNOSIS — G459 Transient cerebral ischemic attack, unspecified: Secondary | ICD-10-CM | POA: Insufficient documentation

## 2012-11-08 DIAGNOSIS — M6281 Muscle weakness (generalized): Secondary | ICD-10-CM

## 2012-11-08 DIAGNOSIS — I69959 Hemiplegia and hemiparesis following unspecified cerebrovascular disease affecting unspecified side: Secondary | ICD-10-CM

## 2012-11-08 DIAGNOSIS — R42 Dizziness and giddiness: Secondary | ICD-10-CM

## 2012-11-08 DIAGNOSIS — R5383 Other fatigue: Secondary | ICD-10-CM | POA: Insufficient documentation

## 2012-11-08 DIAGNOSIS — M5030 Other cervical disc degeneration, unspecified cervical region: Secondary | ICD-10-CM | POA: Insufficient documentation

## 2012-11-08 DIAGNOSIS — R5381 Other malaise: Secondary | ICD-10-CM | POA: Insufficient documentation

## 2012-11-08 DIAGNOSIS — M47812 Spondylosis without myelopathy or radiculopathy, cervical region: Secondary | ICD-10-CM | POA: Insufficient documentation

## 2012-11-20 ENCOUNTER — Emergency Department (HOSPITAL_COMMUNITY): Payer: Medicare Other

## 2012-11-20 ENCOUNTER — Observation Stay (HOSPITAL_COMMUNITY)
Admission: EM | Admit: 2012-11-20 | Discharge: 2012-11-21 | Disposition: A | Discharge status: Home / Self Care | Payer: Medicare Other | Attending: Internal Medicine | Admitting: Internal Medicine

## 2012-11-20 ENCOUNTER — Observation Stay (HOSPITAL_COMMUNITY): Payer: Medicare Other

## 2012-11-20 DIAGNOSIS — R29898 Other symptoms and signs involving the musculoskeletal system: Secondary | ICD-10-CM | POA: Diagnosis not present

## 2012-11-20 DIAGNOSIS — E079 Disorder of thyroid, unspecified: Secondary | ICD-10-CM

## 2012-11-20 DIAGNOSIS — Z79899 Other long term (current) drug therapy: Secondary | ICD-10-CM | POA: Diagnosis not present

## 2012-11-20 DIAGNOSIS — I69959 Hemiplegia and hemiparesis following unspecified cerebrovascular disease affecting unspecified side: Secondary | ICD-10-CM

## 2012-11-20 DIAGNOSIS — M5030 Other cervical disc degeneration, unspecified cervical region: Secondary | ICD-10-CM

## 2012-11-20 DIAGNOSIS — R930 Abnormal findings on diagnostic imaging of skull and head, not elsewhere classified: Secondary | ICD-10-CM | POA: Diagnosis not present

## 2012-11-20 DIAGNOSIS — N189 Chronic kidney disease, unspecified: Secondary | ICD-10-CM | POA: Insufficient documentation

## 2012-11-20 DIAGNOSIS — I10 Essential (primary) hypertension: Secondary | ICD-10-CM | POA: Diagnosis present

## 2012-11-20 DIAGNOSIS — G319 Degenerative disease of nervous system, unspecified: Secondary | ICD-10-CM | POA: Diagnosis not present

## 2012-11-20 DIAGNOSIS — M542 Cervicalgia: Principal | ICD-10-CM | POA: Insufficient documentation

## 2012-11-20 DIAGNOSIS — M47812 Spondylosis without myelopathy or radiculopathy, cervical region: Secondary | ICD-10-CM

## 2012-11-20 DIAGNOSIS — R110 Nausea: Secondary | ICD-10-CM

## 2012-11-20 DIAGNOSIS — F29 Unspecified psychosis not due to a substance or known physiological condition: Secondary | ICD-10-CM | POA: Diagnosis not present

## 2012-11-20 DIAGNOSIS — I69998 Other sequelae following unspecified cerebrovascular disease: Secondary | ICD-10-CM | POA: Diagnosis not present

## 2012-11-20 DIAGNOSIS — R51 Headache: Secondary | ICD-10-CM | POA: Insufficient documentation

## 2012-11-20 DIAGNOSIS — R410 Disorientation, unspecified: Secondary | ICD-10-CM

## 2012-11-20 DIAGNOSIS — R42 Dizziness and giddiness: Secondary | ICD-10-CM | POA: Diagnosis not present

## 2012-11-20 DIAGNOSIS — G459 Transient cerebral ischemic attack, unspecified: Secondary | ICD-10-CM

## 2012-11-20 DIAGNOSIS — IMO0002 Reserved for concepts with insufficient information to code with codable children: Secondary | ICD-10-CM

## 2012-11-20 DIAGNOSIS — G4700 Insomnia, unspecified: Secondary | ICD-10-CM

## 2012-11-20 DIAGNOSIS — R413 Other amnesia: Secondary | ICD-10-CM

## 2012-11-20 DIAGNOSIS — I129 Hypertensive chronic kidney disease with stage 1 through stage 4 chronic kidney disease, or unspecified chronic kidney disease: Secondary | ICD-10-CM | POA: Insufficient documentation

## 2012-11-20 DIAGNOSIS — R079 Chest pain, unspecified: Secondary | ICD-10-CM | POA: Diagnosis not present

## 2012-11-20 DIAGNOSIS — M6281 Muscle weakness (generalized): Secondary | ICD-10-CM

## 2012-11-20 DIAGNOSIS — I639 Cerebral infarction, unspecified: Secondary | ICD-10-CM

## 2012-11-20 DIAGNOSIS — R5381 Other malaise: Secondary | ICD-10-CM

## 2012-11-20 DIAGNOSIS — R209 Unspecified disturbances of skin sensation: Secondary | ICD-10-CM

## 2012-11-20 DIAGNOSIS — R6889 Other general symptoms and signs: Secondary | ICD-10-CM | POA: Diagnosis not present

## 2012-11-20 DIAGNOSIS — M4800 Spinal stenosis, site unspecified: Secondary | ICD-10-CM

## 2012-11-20 DIAGNOSIS — I6529 Occlusion and stenosis of unspecified carotid artery: Secondary | ICD-10-CM | POA: Diagnosis not present

## 2012-11-20 DIAGNOSIS — R350 Frequency of micturition: Secondary | ICD-10-CM

## 2012-11-20 DIAGNOSIS — R404 Transient alteration of awareness: Secondary | ICD-10-CM | POA: Diagnosis not present

## 2012-11-20 LAB — CBC WITH DIFFERENTIAL/PLATELET
Basophils Relative: 0 % (ref 0–1)
Eosinophils Absolute: 0.1 10*3/uL (ref 0.0–0.7)
Eosinophils Relative: 1 % (ref 0–5)
Hemoglobin: 12.7 g/dL (ref 12.0–15.0)
Lymphs Abs: 2.2 10*3/uL (ref 0.7–4.0)
MCH: 28.3 pg (ref 26.0–34.0)
MCHC: 32.8 g/dL (ref 30.0–36.0)
MCV: 86.4 fL (ref 78.0–100.0)
Monocytes Absolute: 0.6 10*3/uL (ref 0.1–1.0)
Monocytes Relative: 8 % (ref 3–12)
RBC: 4.48 MIL/uL (ref 3.87–5.11)

## 2012-11-20 LAB — BASIC METABOLIC PANEL
BUN: 16 mg/dL (ref 6–23)
Calcium: 8.9 mg/dL (ref 8.4–10.5)
Creatinine, Ser: 0.98 mg/dL (ref 0.50–1.10)
GFR calc non Af Amer: 52 mL/min — ABNORMAL LOW (ref >90)
Glucose, Bld: 103 mg/dL — ABNORMAL HIGH (ref 70–99)

## 2012-11-20 MED ORDER — LISINOPRIL 2.5 MG PO TABS
2.5000 mg | ORAL_TABLET | Freq: Every day | ORAL | Status: DC
  Administered 2012-11-21: 2.5 mg via ORAL
  Filled 2012-11-20: qty 1

## 2012-11-20 MED ORDER — LATANOPROST 0.005 % OP SOLN
1.0000 [drp] | Freq: Every day | OPHTHALMIC | Status: DC
  Administered 2012-11-20: 1 [drp] via OPHTHALMIC
  Filled 2012-11-20: qty 2.5

## 2012-11-20 MED ORDER — SODIUM CHLORIDE 0.9 % IV BOLUS (SEPSIS)
1000.0000 mL | Freq: Once | INTRAVENOUS | Status: AC
  Administered 2012-11-20: 1000 mL via INTRAVENOUS

## 2012-11-20 MED ORDER — ASPIRIN 325 MG PO TABS
325.0000 mg | ORAL_TABLET | Freq: Every day | ORAL | Status: DC
  Filled 2012-11-20: qty 1

## 2012-11-20 MED ORDER — ENOXAPARIN SODIUM 40 MG/0.4ML ~~LOC~~ SOLN
40.0000 mg | Freq: Every day | SUBCUTANEOUS | Status: DC
  Administered 2012-11-20: 40 mg via SUBCUTANEOUS
  Filled 2012-11-20 (×2): qty 0.4

## 2012-11-20 MED ORDER — LEVOTHYROXINE SODIUM 75 MCG PO TABS
75.0000 ug | ORAL_TABLET | Freq: Every day | ORAL | Status: DC
  Administered 2012-11-21: 75 ug via ORAL
  Filled 2012-11-20 (×2): qty 1

## 2012-11-20 MED ORDER — TIMOLOL MALEATE 0.5 % OP SOLN
1.0000 [drp] | Freq: Every day | OPHTHALMIC | Status: DC
  Administered 2012-11-21: 1 [drp] via OPHTHALMIC
  Filled 2012-11-20 (×2): qty 5

## 2012-11-20 MED ORDER — TIMOLOL HEMIHYDRATE 0.5 % OP SOLN: 1.0000 [drp] | Freq: Every day | OPHTHALMIC | Status: DC

## 2012-11-20 MED ORDER — ASPIRIN-DIPYRIDAMOLE ER 25-200 MG PO CP12
1.0000 | ORAL_CAPSULE | Freq: Two times a day (BID) | ORAL | Status: DC
  Administered 2012-11-20 – 2012-11-21 (×2): 1 via ORAL
  Filled 2012-11-20 (×3): qty 1

## 2012-11-20 MED ORDER — GABAPENTIN 100 MG PO CAPS
100.0000 mg | ORAL_CAPSULE | Freq: Every day | ORAL | Status: DC
  Administered 2012-11-20: 100 mg via ORAL
  Filled 2012-11-20 (×2): qty 1

## 2012-11-20 NOTE — ED Provider Notes (Signed)
I saw and evaluated the patient, reviewed the resident's note and I agree with the findings and plan. I personally evaluated the ECG and agree with the interpretation of the resident  Concerning for TIA.  The patient be admitted the hospital for additional workup.  Symptoms have resolved at this time  Lyanne Co, MD 11/20/12 [phone]

## 2012-11-20 NOTE — ED Notes (Signed)
Myself and Heather, EMT undressed pt, placed in gown, on monitor, continuous pulse oximetry and blood pressure cuff; warm blanket given

## 2012-11-20 NOTE — ED Notes (Signed)
Patient transported to CT 

## 2012-11-20 NOTE — ED Notes (Signed)
Pt from home c/o right sided neck/throat pain Per EMS, pt was hypertensive and tachypnic upon arrival. Pt was initially confused ( to age ) and slow to respond. No other deficits noted. Hx of TIA

## 2012-11-20 NOTE — ED Provider Notes (Signed)
History    CSN: [id number] Arrival date & time 11/20/12  1248  First MD Initiated Contact with Patient 11/20/12 1250     Chief Complaint  Patient presents with  . Neck Pain   (Consider location/radiation/quality/duration/timing/severity/associated sxs/prior Treatment) Patient is a 79 y.o. female presenting with general illness.  Illness Location:  R anterior neck Quality:  Sharp pain and confusion with ? difficulty speaking Severity:  Moderate Onset quality:  Sudden Duration:  1 hour Timing:  Constant Progression:  Resolved Chronicity:  New Context:  Sitting Relieved by:  Nothing Worsened by:  Nothing Associated symptoms: no abdominal pain, no chest pain, no congestion, no cough, no diarrhea, no fever, no headaches, no nausea, no rash, no rhinorrhea, no shortness of breath, no sore throat and no vomiting   Risk factors:  Prior CVA, TIA  Past Medical History  Diagnosis Date  . History of chicken pox   . Glaucoma     both eyes  . Hypertension   . Chronic kidney disease 1998    stones  . Stroke 04/2007    x 3 total  . Thyroid disease     hypothyroid  . Fibrocystic breast 01/12/2011  . Insomnia 01/13/2011  . Urinary frequency 02/09/2011  . Vertigo 02/09/2011  . Spinal stenosis 06/14/2011  . Nausea 05/31/2012  . Memory loss 10/12/2012   Past Surgical History  Procedure Laterality Date  . Abdominal hysterectomy  1963  . Back surgery  1989    discectomy L4-5 1989, good results  . Breast surgery  1970    biopsy, benign, b/l   Family History  Problem Relation Age of Onset  . Heart disease Mother   . Hypertension Mother   . Diabetes Mother   . Heart disease Father   . Hypertension Father   . Cancer Sister     breast cancer   History  Substance Use Topics  . Smoking status: Never Smoker   . Smokeless tobacco: Never Used  . Alcohol Use: 1.8 oz/week    3 Glasses of wine per week   OB History   Grav Para Term Preterm Abortions TAB SAB Ect Mult Living       Review of Systems  Constitutional: Negative for fever and chills.  HENT: Negative for congestion, sore throat and rhinorrhea.   Eyes: Negative for photophobia and visual disturbance.  Respiratory: Negative for cough and shortness of breath.   Cardiovascular: Negative for chest pain and leg swelling.  Gastrointestinal: Negative for nausea, vomiting, abdominal pain, diarrhea and constipation.  Endocrine: Negative for polyphagia and polyuria.  Genitourinary: Negative for dysuria, flank pain, vaginal bleeding, vaginal discharge and enuresis.  Musculoskeletal: Negative for back pain and gait problem.  Skin: Negative for color change and rash.  Neurological: Positive for dizziness. Negative for syncope, light-headedness, numbness and headaches.  Hematological: Negative for adenopathy. Does not bruise/bleed easily.  All other systems reviewed and are negative.    Allergies  Ciprofloxacin; Combigan; Detrol; Oxycodone; Penicillins; and Sulfa antibiotics  Home Medications   Current Outpatient Rx  Name  Route  Sig  Dispense  Refill  . Cholecalciferol (VITAMIN D-3 PO)   Oral   Take 1 tablet by mouth daily.         . Cyanocobalamin (B-12 PO)   Oral   Take 1 tablet by mouth daily.          Marland Kitchen dipyridamole-aspirin (AGGRENOX) 200-25 MG per 12 hr capsule   Oral   Take 1 capsule by mouth 2 (  two) times daily.   180 capsule   3   . Ferrous Fumarate-Folic Acid (HEMOCYTE-F) 324-1 MG TABS   Oral   Take 324 mg by mouth daily.   30 each   3   . fish oil-omega-3 fatty acids 1000 MG capsule   Oral   Take 2 g by mouth daily.         Marland Kitchen gabapentin (NEURONTIN) 100 MG capsule   Oral   Take 1 capsule (100 mg total) by mouth at bedtime.   90 capsule   3   . latanoprost (XALATAN) 0.005 % ophthalmic solution   Both Eyes   Place 1 drop into both eyes at bedtime.           Marland Kitchen levothyroxine (SYNTHROID, LEVOTHROID) 75 MCG tablet   Oral   Take 1 tablet (75 mcg total) by mouth daily.    90 tablet   3   . lisinopril (PRINIVIL,ZESTRIL) 2.5 MG tablet   Oral   Take 1 tablet (2.5 mg total) by mouth daily.   90 tablet   3   . timolol (BETIMOL) 0.5 % ophthalmic solution   Both Eyes   Place 1 drop into both eyes every morning.            BP 149/67  Pulse 63  Temp(Src) 98.1 F (36.7 C) (Oral)  Resp 17  SpO2 100% Physical Exam  Vitals reviewed. Constitutional: She is oriented to person, place, and time. She appears well-developed and well-nourished.  HENT:  Head: Normocephalic and atraumatic.  Right Ear: External ear normal.  Left Ear: External ear normal.  Eyes: Conjunctivae and EOM are normal. Pupils are equal, round, and reactive to light.  Neck: Normal range of motion. Neck supple.  Cardiovascular: Normal rate, regular rhythm, normal heart sounds and intact distal pulses.   Pulmonary/Chest: Effort normal and breath sounds normal.  Abdominal: Soft. Bowel sounds are normal. There is no tenderness.  Musculoskeletal: Normal range of motion.  Neurological: She is alert and oriented to person, place, and time. She has normal reflexes. No cranial nerve deficit or sensory deficit. Gait (unsteady, not ataxic, baseline ho R sided weakness and walks with cane) abnormal.  4/5 strength RU and RLE  Skin: Skin is warm and dry.    ED Course  Procedures (including critical care time) Labs Reviewed  CBC WITH DIFFERENTIAL - Abnormal; Notable for the following:    RDW 19.9 (*)    All other components within normal limits  BASIC METABOLIC PANEL - Abnormal; Notable for the following:    Glucose, Bld 103 (*)    GFR calc non Af Amer 52 (*)    GFR calc Af Amer 60 (*)    All other components within normal limits   Dg Chest 2 View  11/20/2012   *RADIOLOGY REPORT*  Clinical Data: Neck pain.  Chest pain.  CHEST - 2 VIEW  Comparison: 01/30/2011.  Findings:  Cardiopericardial silhouette within normal limits. Mediastinal contours normal. Trachea midline.  No airspace disease or  effusion.  Left basilar subsegmental atelectasis and / or scarring appears similar to the prior exam from 2012.  Monitoring leads are projected over the chest.  IMPRESSION: No interval change or acute cardiopulmonary disease.   Original Report Authenticated By: Andreas Newport, M.D.   Ct Head Wo Contrast  11/20/2012   *RADIOLOGY REPORT*  Clinical Data: Neck pain, altered mental status  CT HEAD WITHOUT CONTRAST  Technique:  Contiguous axial images were obtained from the base of the  skull through the vertex without contrast.  Comparison: None.  Findings: No acute intracranial hemorrhage.  No focal mass lesion. No CT evidence of acute infarction.   No midline shift or mass effect.  No hydrocephalus.  Basilar cisterns are patent. Visualized cortical atrophy similar to prior.  There is mild periventricular subcortical white matter hypodensities.  There are lacunar infarction within the lentiform nuclei unchanged from prior. Paranasal sinuses and mastoid air cells are clear.  Orbits are normal.  IMPRESSION:  1.  No acute intracranial findings. 2.  Atrophy and  chronic microvascular disease.   Original Report Authenticated By: Genevive Bi, M.D.   No diagnosis found.   Date: 11/20/2012  Rate: 68  Rhythm: normal sinus rhythm  QRS Axis: left  Intervals: PR prolonged  ST/T Wave abnormalities: Poor R wave progression  Conduction Disutrbances:none  Narrative Interpretation:   Old EKG Reviewed: unchanged    MDM  79 y.o. female  with pertinent PMH of CVA, TIA, CKD presents with anterior neck pain and confusion beginning abruptly 2 hours prior to visit and stopping 1 hour after start spontaneously.  She denies recent trauma, and cannot adequately describe symptoms, however now is back to baseline.  No recent fevers or other illness.  Physical exam now benign.  Given ho CVA and TIA, with pt stated complaint of "I'm not sure if I had a mini-stroke", some concern for TIA.  Obtained labs and imaging which returned  unremarkable for pt condition.  Given that she has had somewhat similar symptoms in the past with prior TIA and CVA symptoms (neck numbness), feel admission warranted for TIA workup.  Consulted hospitalist, pt admitted in stable condition without symptoms.    Labs and imaging as above reviewed by myself and attending,Dr. Patria Mane, with whom case was discussed.   Clinical Impression: Transient confusion Neck pain    Noel Gerold, MD 11/20/12 1624

## 2012-11-20 NOTE — H&P (Signed)
[facility] History and Physical  [name]:[id number] DOB: April 05, 1927 DOA: 11/20/2012  Referring physician: PCP: Danise Edge, MD  Specialists:  Chief Complaint: Right thigh pain with radiation to right jaw and neck  HPI: Darlene Wilson is a 79 y.o. WF PMHx multiple CVA/TIA starting in 2009, last episode approximately 2012 states positive residual right lower extremity weakness, CKD States starting this afternoon positive pain starting under right eye with radiation down jaw line to anterior right neck pain (consistent with previous TIA/stroke symptoms), positive confusion lasting for approximately 1 hour states contacted son who insisted that she proceed to ED for evaluation She denies recent trauma. Back to baseline and ED is back to baseline. States at baseline and related home on outside with a cane, and occasionally a walker No recent fevers or other illness. Head CT showed;:  1. No acute intracranial findings.  2. Atrophy and chronic microvascular disease. Brain MRI;Pending    Review of Systems: The patient denies anorexia, fever, weight loss,, vision loss, decreased hearing, hoarseness, chest pain, syncope, dyspnea on exertion, peripheral edema, balance deficits, hemoptysis, abdominal pain, melena, hematochezia, severe indigestion/heartburn, hematuria, incontinence, genital sores, muscle weakness, suspicious skin lesions, transient blindness,  depression, unusual weight change, abnormal bleeding, enlarged lymph nodes, angioedema, and breast masses.    Past Medical History  Diagnosis Date  . History of chicken pox   . Glaucoma     both eyes  . Hypertension   . Chronic kidney disease 1998    stones  . Stroke 04/2007    x 3 total  . Thyroid disease     hypothyroid  . Fibrocystic breast 01/12/2011  . Insomnia 01/13/2011  . Urinary frequency 02/09/2011  . Vertigo 02/09/2011  . Spinal stenosis 06/14/2011  . Nausea 05/31/2012  . Memory loss 10/12/2012   Past Surgical  History  Procedure Laterality Date  . Abdominal hysterectomy  1963  . Back surgery  1989    discectomy L4-5 1989, good results  . Breast surgery  1970    biopsy, benign, b/l   Social History:  reports that she has never smoked. She has never used smokeless tobacco. She reports that she drinks about 1.8 ounces of alcohol per week. She reports that she does not use illicit drugs. where does patient live; home alone Can patient participate in ADLs? Yes  Allergies  Allergen Reactions  . Ciprofloxacin Hives    Given with 2 other meds/unsure if truly allergic  . Combigan (Brimonidine Tartrate-Timolol) Other (See Comments)    Inflamation  . Detrol (Tolterodine Tartrate) Hives    Given with 2 other medications/unsure if truly allergic  . Oxycodone Hives    Given with 2 other medications/unsure if truly allergic  . Penicillins Hives, Itching and Rash  . Sulfa Antibiotics Hives, Itching and Rash    Family History  Problem Relation Age of Onset  . Heart disease Mother   . Hypertension Mother   . Diabetes Mother   . Heart disease Father   . Hypertension Father   . Cancer Sister     breast cancer     Prior to Admission medications   Medication Sig Start Date End Date Taking? Authorizing Provider  Cholecalciferol (VITAMIN D-3 PO) Take 1 tablet by mouth daily.   Yes Historical Provider, MD  Cyanocobalamin (B-12 PO) Take 1 tablet by mouth daily.    Yes Historical Provider, MD  dipyridamole-aspirin (AGGRENOX) 200-25 MG per 12 hr capsule Take 1 capsule by mouth 2 (two) times daily.  12/13/11  Yes [name], MD  Ferrous Fumarate-Folic Acid (HEMOCYTE-F) 324-1 MG TABS Take 324 mg by mouth daily. 10/10/12  Yes [name], MD  fish oil-omega-3 fatty acids 1000 MG capsule Take 2 g by mouth daily.   Yes Historical Provider, MD  gabapentin (NEURONTIN) 100 MG capsule Take 1 capsule (100 mg total) by mouth at bedtime. 12/13/11  Yes [name], MD  latanoprost (XALATAN) 0.005 % ophthalmic  solution Place 1 drop into both eyes at bedtime.     Yes Historical Provider, MD  levothyroxine (SYNTHROID, LEVOTHROID) 75 MCG tablet Take 1 tablet (75 mcg total) by mouth daily. 12/13/11  Yes [name], MD  lisinopril (PRINIVIL,ZESTRIL) 2.5 MG tablet Take 1 tablet (2.5 mg total) by mouth daily. 12/13/11  Yes [name], MD  timolol (BETIMOL) 0.5 % ophthalmic solution Place 1 drop into both eyes every morning.     Yes Historical Provider, MD   Physical Exam: Filed Vitals:   11/20/12 1600 11/20/12 1700 11/20/12 1805 11/20/12 1956  BP: 149/67 158/78 156/80   Pulse: 63 71 63   Temp:   98.2 F (36.8 C)   TempSrc:   Oral   Resp: 17 21 20    Height:    5\' 3"  (1.6 m)  Weight:    61.236 kg (135 lb)  SpO2: 100% 99% 100%      General:  In no acute distress  Cardiovascular: Regular rhythm and rate, negative murmurs rubs or gallops, DP/PT pulses 2+  Respiratory: Clear to auscultation bilaterally  Abdomen: Soft nontender nondistended plus bowel sounds  Neurologic: Pupils equal round reactive to light and accommodation, cranial nerves II through XII intact, left upper extremity/left lower extremity./right upper extremity strength 5/5. Right lower extremity strength 4/5, directs right foot when ambulating(pronounced limp), negative Romberg, negative pronator drift. Sensation intact throughout  Labs on Admission:  Basic Metabolic Panel:  Recent Labs Lab 11/20/12 1445  NA 140  K 4.3  CL 105  CO2 28  GLUCOSE 103*  BUN 16  CREATININE 0.98  CALCIUM 8.9   Liver Function Tests: No results found for this basename: AST, ALT, ALKPHOS, BILITOT, PROT, ALBUMIN,  in the last 168 hours No results found for this basename: LIPASE, AMYLASE,  in the last 168 hours No results found for this basename: AMMONIA,  in the last 168 hours CBC:  Recent Labs Lab 11/20/12 1445  WBC 6.9  NEUTROABS 4.1  HGB 12.7  HCT 38.7  MCV 86.4  PLT 276   Cardiac Enzymes: No results found for this basename:  CKTOTAL, CKMB, CKMBINDEX, TROPONINI,  in the last 168 hours  BNP (last 3 results) No results found for this basename: PROBNP,  in the last 8760 hours CBG: No results found for this basename: GLUCAP,  in the last 168 hours  Radiological Exams on Admission: Dg Chest 2 View  11/20/2012   *RADIOLOGY REPORT*  Clinical Data: Neck pain.  Chest pain.  CHEST - 2 VIEW  Comparison: 01/30/2011.  Findings:  Cardiopericardial silhouette within normal limits. Mediastinal contours normal. Trachea midline.  No airspace disease or effusion.  Left basilar subsegmental atelectasis and / or scarring appears similar to the prior exam from 2012.  Monitoring leads are projected over the chest.  IMPRESSION: No interval change or acute cardiopulmonary disease.   Original Report Authenticated By: Andreas Newport, M.D.   Ct Head Wo Contrast  11/20/2012   *RADIOLOGY REPORT*  Clinical Data: Neck pain, altered mental status  CT HEAD  WITHOUT CONTRAST  Technique:  Contiguous axial images were obtained from the base of the skull through the vertex without contrast.  Comparison: None.  Findings: No acute intracranial hemorrhage.  No focal mass lesion. No CT evidence of acute infarction.   No midline shift or mass effect.  No hydrocephalus.  Basilar cisterns are patent. Visualized cortical atrophy similar to prior.  There is mild periventricular subcortical white matter hypodensities.  There are lacunar infarction within the lentiform nuclei unchanged from prior. Paranasal sinuses and mastoid air cells are clear.  Orbits are normal.  IMPRESSION:  1.  No acute intracranial findings. 2.  Atrophy and  chronic microvascular disease.   Original Report Authenticated By: Genevive Bi, M.D.    EKG: Normal sinus rhythm, rate 72, left axis deviation, first degree AV block.  Assessment/Plan Principal Problem:   TIA (transient ischemic attack) Active Problems:   Hypertension   Neck pain   Confusion   1. TIA; if brain MRI returns  without abnormality no further workup warranted. Patient currently on Aggrenox. Adding aspirin to regimen would increase patient's risk of bleeding without adding any significant protection. 2. HTN; mildly elevated above initial BP at presentation if still elevated in a.m. Will adjust medication prior to discharge  Code Status: Full Disposition Plan: Discharge in a.m. unless MRI indicates further workup or  Time spent: 90 minutes  Izabella Marcantel, Roselind Messier [facility] Pager [phone]  If 7PM-7AM, please contact night-coverage [email or web address] Password [facility] 11/20/2012, 8:49 PM

## 2012-11-21 ENCOUNTER — Encounter (HOSPITAL_COMMUNITY): Payer: Self-pay | Admitting: *Deleted

## 2012-11-21 DIAGNOSIS — F29 Unspecified psychosis not due to a substance or known physiological condition: Secondary | ICD-10-CM

## 2012-11-21 LAB — LIPID PANEL
Cholesterol: 144 mg/dL (ref 0–200)
HDL: 42 mg/dL (ref >39)
Total CHOL/HDL Ratio: 3.4 RATIO
VLDL: 18 mg/dL (ref 0–40)

## 2012-11-21 LAB — HEMOGLOBIN A1C: Hgb A1c MFr Bld: 5.6 % (ref <5.7)

## 2012-11-21 MED ORDER — HYDROMORPHONE HCL 2 MG PO TABS
1.0000 mg | ORAL_TABLET | ORAL | Status: DC | PRN
  Administered 2012-11-21: 1 mg via ORAL
  Filled 2012-11-21: qty 1

## 2012-11-21 MED ORDER — ACETAMINOPHEN 325 MG PO TABS: 650.0000 mg | ORAL_TABLET | Freq: Four times a day (QID) | ORAL | Status: AC | PRN

## 2012-11-21 MED ORDER — HYDROMORPHONE HCL 2 MG PO TABS: 2.0000 mg | ORAL_TABLET | Freq: Four times a day (QID) | ORAL | Status: DC | PRN

## 2012-11-21 MED ORDER — ACETAMINOPHEN 325 MG PO TABS
650.0000 mg | ORAL_TABLET | Freq: Four times a day (QID) | ORAL | Status: DC | PRN
Start: 2012-11-21 — End: 2012-11-21
  Administered 2012-11-21: 650 mg via ORAL
  Filled 2012-11-21: qty 2

## 2012-11-21 NOTE — Progress Notes (Signed)
Pt d/c to home by car with family. Assessment stable. Prescriptions given. Pt verbalizes understanding of d/c instructions. 

## 2012-11-21 NOTE — Care Management Note (Signed)
    Page 1 of 1   11/21/2012     5:37:07 PM   CARE MANAGEMENT NOTE 11/21/2012  Patient:  Darlene Wilson, Darlene Wilson   Account Number:  [id number]  Date Initiated:  11/21/2012  Documentation initiated by:  Elmer Bales  Subjective/Objective Assessment:   Patient admitted for altered mental status.     Action/Plan:   Will follow for discharge needs   Anticipated DC Date:  11/21/2012   Anticipated DC Plan:  HOME W HOME HEALTH SERVICES      DC Planning Services  CM consult      Choice offered to / List presented to:          [facility] arranged  HH-3 OT  HH-2 PT      [facility] agency  [facility]   Status of service:  Completed, signed off Medicare Important Message given?   (If response is "NO", the following Medicare IM given date fields will be blank) Date Medicare IM given:   Date Additional Medicare IM given:    Discharge Disposition:  HOME W HOME HEALTH SERVICES  Per UR Regulation:  Reviewed for med. necessity/level of care/duration of stay  If discussed at Long Length of Stay Meetings, dates discussed:    Comments:  11/21/12 1650  Elmer Bales RN, MSN CM-  Met with patient to discuss discharge planning.  Patient is agreeable to home health and has chosen to use [facility]. Mary with [facility] was notified and has accepted the referral.

## 2012-11-21 NOTE — Evaluation (Signed)
Physical Therapy Evaluation Patient Details Name: Darlene Wilson MRN: [id number] DOB: 02/05/1927 Today's Date: 11/21/2012 Time:  -     PT Assessment / Plan / Recommendation History of Present Illness  79 y/o female with h/o TIA/CVA adm for neck pain and difficulty speaking. TIA w/u complete, MRI (-) for acute CVA.  Clinical Impression  Presents to PT with what appears to be a baseline balance impairment scoring a 15/24 on DGI indicating higher risk for falls. Denies any falls within the last year however does note she slows down and is very cautious. Noted plans for d/c home today with intermittent assist from son. Will benefit full balance w/u by HHPT as well as home health safety evaluation (OT).     PT Assessment  All further PT needs can be met in the next venue of care    Follow Up Recommendations  Home health PT;Other (comment);Supervision - Intermittent (HHOT safety eval)    Does the patient have the potential to tolerate intense rehabilitation      Barriers to Discharge        Equipment Recommendations  None recommended by PT    Recommendations for Other Services     Frequency      Precautions / Restrictions Precautions Precautions: Fall Restrictions Weight Bearing Restrictions: No   Pertinent Vitals/Pain Denies pain      Mobility  Bed Mobility Bed Mobility: Supine to Sit Supine to Sit: 6: Modified independent (Device/Increase time) Transfers Transfers: Sit to Stand;Stand to Sit Sit to Stand: 6: Modified independent (Device/Increase time);From bed Stand to Sit: 6: Modified independent (Device/Increase time);To chair/3-in-1 Ambulation/Gait Ambulation/Gait Assistance: 5: Supervision Ambulation Distance (Feet): 300 Feet Assistive device: Straight cane Ambulation/Gait Assistance Details: sequencing cues for [facility] however pt reports "I like the way I do it better, that is just confusing" Gait Pattern: Wide base of support;Lateral trunk lean to right;Decreased  stride length;Step-through pattern Gait velocity: ambulates at speed less than 1.8 ft/sec with limited ability to adjust slower or faster (see DGI section) General Gait Details: decreased step height bilaterally Stairs: Yes Stairs Assistance: 6: Modified independent (Device/Increase time) Stair Management Technique: Step to pattern;One rail Left;With cane Number of Stairs: 2 Modified Rankin (Stroke Patients Only) Pre-Morbid Rankin Score: No symptoms Modified Rankin: No symptoms    Exercises     PT Diagnosis: Abnormality of gait  PT Problem List: Decreased balance PT Treatment Interventions:       PT Goals Acute Rehab PT Goals PT Goal Formulation: No goals set, d/c therapy  Visit Information  Last PT Received On: 11/21/12 Assistance Needed: +1 History of Present Illness: 79 y/o female with h/o TIA/CVA adm for neck pain and difficulty speaking. TIA w/u complete, MRI (-) for acute CVA.       Prior Functioning  Home Living Family/patient expects to be discharged to:: Private residence Living Arrangements: Alone Available Help at Discharge: Family;Available PRN/intermittently Type of Home: House Home Access: Stairs to enter [employer] of Steps: 1 Entrance Stairs-Rails: None Home Layout: One level Home Equipment: Cane - single point;Shower seat;Grab bars - tub/shower;Walker - 2 wheels Additional Comments: gets down into the tub to bathe using her grab bars Prior Function Level of Independence: Independent with assistive device(s) Comments: uses cane outside, furniture walks inside Communication Communication: No difficulties    Cognition  Cognition Arousal/Alertness: Awake/alert Behavior During Therapy: WFL for tasks assessed/performed Overall Cognitive Status: Within Functional Limits for tasks assessed    Extremity/Trunk Assessment Upper Extremity Assessment Upper Extremity Assessment: Overall [facility]  for tasks assessed Lower Extremity Assessment Lower  Extremity Assessment: Overall WFL for tasks assessed   Balance Standardized Balance Assessment Standardized Balance Assessment: Dynamic Gait Index Dynamic Gait Index Level Surface: Mild Impairment Change in Gait Speed: Mild Impairment Gait with Horizontal Head Turns: Mild Impairment Gait with Vertical Head Turns: Mild Impairment Gait and Pivot Turn: Mild Impairment Step Over Obstacle: Mild Impairment Step Around Obstacles: Mild Impairment Steps: Moderate Impairment Total Score: 15  End of Session PT - End of Session Equipment Utilized During Treatment: Gait belt Activity Tolerance: Patient tolerated treatment well Patient left: in chair;with call bell/phone within reach;with chair alarm set Nurse Communication: Mobility status  GP Functional Assessment Tool Used: DGI Functional Limitation: Mobility: Walking and moving around Mobility: Walking and Moving Around Current Status ([id number]): At least 1 percent but less than 20 percent impaired, limited or restricted Mobility: Walking and Moving Around Goal Status [phone]): 0 percent impaired, limited or restricted Mobility: Walking and Moving Around Discharge Status [phone]): At least 1 percent but less than 20 percent impaired, limited or restricted   [facility] HELEN 11/21/2012, 4:10 PM

## 2012-11-21 NOTE — Discharge Summary (Signed)
Physician Discharge Summary  Darlene Wilson ZOX:[id number] DOB: 1926-06-18 DOA: 11/20/2012  PCP: Danise Edge, MD  Admit date: 11/20/2012 Discharge date: 11/21/2012  Time spent: <30 minutes  Discharge Diagnoses:   Transient right face and neck pain    Hypertension  Discharge Condition:  stable  Filed Weights   11/20/12 1956  Weight: 61.236 kg (135 lb)    History of present illness:  Darlene Wilson is a 79 y.o. WF PMHx multiple CVA/TIA starting in 2009, last episode approximately 2012 states positive residual right lower extremity weakness, CKD States starting this afternoon positive pain starting under right eye with radiation down jaw line to anterior right neck pain (consistent with previous TIA/stroke symptoms), positive confusion lasting for approximately 1 hour states contacted son who insisted that she proceed to ED for evaluation She denies recent trauma. Back to baseline and ED is back to baseline. States at baseline and related home on outside with a cane, and occasionally a walker No recent fevers or other illness. Head CT showed;:  1. No acute intracranial findings.  2. Atrophy and chronic microvascular disease.  Hospital Course:  Placed on observation. Symptoms did not return. MRI/MRA brain shows nothing acute. Doubt TIA. Discharge home on same meds  Procedures:  none  Consultations:  none  Discharge Exam: Filed Vitals:   11/20/12 2220 11/21/12 0131 11/21/12 0513 11/21/12 0909  BP: 163/70 119/59 118/53 117/60  Pulse: 63 68 65 72  Temp: 97.8 F (36.6 C) 97.9 F (36.6 C) 97.7 F (36.5 C) 98.1 F (36.7 C)  TempSrc: Oral Oral Oral Oral  Resp: 18 18 16 20   Height:      Weight:      SpO2: 97% 97% 96% 98%    General: alert oriented Neuro exam:  nonfocal  Discharge Instructions  Discharge Orders   Future Appointments Provider Department Dept Phone   01/14/2013 2:45 PM [name], MD [facility] [phone]   02/06/2013 10:00 AM  [name], MD [facility]  [facility] [phone]   Future Orders Complete By Expires     Activity as tolerated - No restrictions  As directed     Diet - low sodium heart healthy  As directed         Medication List    TAKE these medications       acetaminophen 325 MG tablet  Commonly known as:  TYLENOL  Take 2 tablets (650 mg total) by mouth every 6 (six) hours as needed.     B-12 PO  Take 1 tablet by mouth daily.     dipyridamole-aspirin 200-25 MG per 12 hr capsule  Commonly known as:  AGGRENOX  Take 1 capsule by mouth 2 (two) times daily.     Ferrous Fumarate-Folic Acid 324-1 MG Tabs  Commonly known as:  HEMOCYTE-F  Take 324 mg by mouth daily.     fish oil-omega-3 fatty acids 1000 MG capsule  Take 2 g by mouth daily.     gabapentin 100 MG capsule  Commonly known as:  NEURONTIN  Take 1 capsule (100 mg total) by mouth at bedtime.     HYDROmorphone 2 MG tablet  Commonly known as:  DILAUDID  Take 1 tablet (2 mg total) by mouth every 6 (six) hours as needed.     latanoprost 0.005 % ophthalmic solution  Commonly known as:  XALATAN  Place 1 drop into both eyes at bedtime.     levothyroxine 75 MCG tablet  Commonly known  as:  SYNTHROID, LEVOTHROID  Take 1 tablet (75 mcg total) by mouth daily.     lisinopril 2.5 MG tablet  Commonly known as:  PRINIVIL,ZESTRIL  Take 1 tablet (2.5 mg total) by mouth daily.     timolol 0.5 % ophthalmic solution  Commonly known as:  BETIMOL  Place 1 drop into both eyes every morning.     VITAMIN D-3 PO  Take 1 tablet by mouth daily.       Allergies  Allergen Reactions  . Ciprofloxacin Hives    Given with 2 other meds/unsure if truly allergic  . Combigan (Brimonidine Tartrate-Timolol) Other (See Comments)    Inflamation  . Detrol (Tolterodine Tartrate) Hives    Given with 2 other medications/unsure if truly allergic  . Oxycodone Hives    Given with 2 other medications/unsure if truly allergic  . Penicillins  Hives, Itching and Rash  . Sulfa Antibiotics Hives, Itching and Rash       Follow-up Information   Follow up with [facility], [place]. (If symptoms worsen)    Contact information:   [address] [place] [address] [phone]        The results of significant diagnostics from this hospitalization (including imaging, microbiology, ancillary and laboratory) are listed below for reference.    Significant Diagnostic Studies: Dg Chest 2 View  11/20/2012   *RADIOLOGY REPORT*  Clinical Data: Neck pain.  Chest pain.  CHEST - 2 VIEW  Comparison: 01/30/2011.  Findings:  Cardiopericardial silhouette within normal limits. Mediastinal contours normal. Trachea midline.  No airspace disease or effusion.  Left basilar subsegmental atelectasis and / or scarring appears similar to the prior exam from 2012.  Monitoring leads are projected over the chest.  IMPRESSION: No interval change or acute cardiopulmonary disease.   Original Report Authenticated By: Andreas Newport, M.D.   Ct Head Wo Contrast  11/20/2012   *RADIOLOGY REPORT*  Clinical Data: Neck pain, altered mental status  CT HEAD WITHOUT CONTRAST  Technique:  Contiguous axial images were obtained from the base of the skull through the vertex without contrast.  Comparison: None.  Findings: No acute intracranial hemorrhage.  No focal mass lesion. No CT evidence of acute infarction.   No midline shift or mass effect.  No hydrocephalus.  Basilar cisterns are patent. Visualized cortical atrophy similar to prior.  There is mild periventricular subcortical white matter hypodensities.  There are lacunar infarction within the lentiform nuclei unchanged from prior. Paranasal sinuses and mastoid air cells are clear.  Orbits are normal.  IMPRESSION:  1.  No acute intracranial findings. 2.  Atrophy and  chronic microvascular disease.   Original Report Authenticated By: Genevive Bi, M.D.   Mri Brain Without Contrast  11/21/2012   *RADIOLOGY  REPORT*  Clinical Data:  Left sided headache.  Confusion.  History of previous cerebrovascular accident.  MRI HEAD WITHOUT CONTRAST MRA HEAD WITHOUT CONTRAST  Technique:  Multiplanar, multiecho pulse sequences of the brain and surrounding structures were obtained without intravenous contrast. Angiographic images of the head were obtained using MRA technique without contrast.  Comparison:  Head CT same day.  MRI 01/31/2011.  MRI HEAD  Findings:  Diffusion imaging does not show any acute or subacute infarction.  The brainstem and cerebellum are normal.  The cerebral hemispheres show age related atrophy with moderate chronic appearing small vessel changes affecting the deep and subcortical white matter.  No cortical or large vessel territory infarction. No mass lesion, hemorrhage, hydrocephalus or extra-axial collection.  No  pituitary mass.  No inflammatory sinus disease.  No skull or skull base lesion.  IMPRESSION: No acute or reversible findings.  Atrophy and moderate chronic small vessel change of the cerebral hemispheric white matter.  MRA HEAD  Findings: Both internal carotid arteries are widely patent into the brain.  There is mild atherosclerotic irregularity in the carotid siphon regions.  The anterior and middle cerebral vessels are patent without proximal stenosis, aneurysm or vascular malformation.  Both vertebral arteries are widely patent to the basilar.  No basilar stenosis.  Posterior circulation branch vessels are patent.  IMPRESSION: Unremarkable intracranial MR angiography of the large and medium- sized vessels.  Mild atherosclerotic change in the carotid siphon regions.   Original Report Authenticated By: Paulina Fusi, M.D.   Mr Mra Head/brain Wo Cm  11/21/2012   *RADIOLOGY REPORT*  Clinical Data:  Left sided headache.  Confusion.  History of previous cerebrovascular accident.  MRI HEAD WITHOUT CONTRAST MRA HEAD WITHOUT CONTRAST  Technique:  Multiplanar, multiecho pulse sequences of the brain and  surrounding structures were obtained without intravenous contrast. Angiographic images of the head were obtained using MRA technique without contrast.  Comparison:  Head CT same day.  MRI 01/31/2011.  MRI HEAD  Findings:  Diffusion imaging does not show any acute or subacute infarction.  The brainstem and cerebellum are normal.  The cerebral hemispheres show age related atrophy with moderate chronic appearing small vessel changes affecting the deep and subcortical white matter.  No cortical or large vessel territory infarction. No mass lesion, hemorrhage, hydrocephalus or extra-axial collection.  No pituitary mass.  No inflammatory sinus disease.  No skull or skull base lesion.  IMPRESSION: No acute or reversible findings.  Atrophy and moderate chronic small vessel change of the cerebral hemispheric white matter.  MRA HEAD  Findings: Both internal carotid arteries are widely patent into the brain.  There is mild atherosclerotic irregularity in the carotid siphon regions.  The anterior and middle cerebral vessels are patent without proximal stenosis, aneurysm or vascular malformation.  Both vertebral arteries are widely patent to the basilar.  No basilar stenosis.  Posterior circulation branch vessels are patent.  IMPRESSION: Unremarkable intracranial MR angiography of the large and medium- sized vessels.  Mild atherosclerotic change in the carotid siphon regions.   Original Report Authenticated By: Paulina Fusi, M.D.    Microbiology: No results found for this or any previous visit (from the past 240 hour(s)).   Labs: Basic Metabolic Panel:  Recent Labs Lab 11/20/12 1445  NA 140  K 4.3  CL 105  CO2 28  GLUCOSE 103*  BUN 16  CREATININE 0.98  CALCIUM 8.9   Liver Function Tests: No results found for this basename: AST, ALT, ALKPHOS, BILITOT, PROT, ALBUMIN,  in the last 168 hours No results found for this basename: LIPASE, AMYLASE,  in the last 168 hours No results found for this basename: AMMONIA,   in the last 168 hours CBC:  Recent Labs Lab 11/20/12 1445  WBC 6.9  NEUTROABS 4.1  HGB 12.7  HCT 38.7  MCV 86.4  PLT 276   Cardiac Enzymes: No results found for this basename: CKTOTAL, CKMB, CKMBINDEX, TROPONINI,  in the last 168 hours BNP: BNP (last 3 results) No results found for this basename: PROBNP,  in the last 8760 hours CBG: No results found for this basename: GLUCAP,  in the last 168 hours  Signed:  Aashna Matson L  [facility] 11/21/2012, 1:17 PM

## 2012-11-28 DIAGNOSIS — H4011X0 Primary open-angle glaucoma, stage unspecified: Secondary | ICD-10-CM | POA: Diagnosis not present

## 2012-11-28 DIAGNOSIS — H409 Unspecified glaucoma: Secondary | ICD-10-CM | POA: Diagnosis not present

## 2012-12-03 ENCOUNTER — Other Ambulatory Visit: Payer: Self-pay | Admitting: Family Medicine

## 2012-12-03 DIAGNOSIS — E039 Hypothyroidism, unspecified: Secondary | ICD-10-CM

## 2012-12-03 DIAGNOSIS — R51 Headache: Secondary | ICD-10-CM

## 2012-12-03 MED ORDER — LEVOTHYROXINE SODIUM 75 MCG PO TABS: 75.0000 ug | ORAL_TABLET | Freq: Every day | ORAL | Status: DC

## 2012-12-03 MED ORDER — GABAPENTIN 100 MG PO CAPS: 100.0000 mg | ORAL_CAPSULE | Freq: Every day | ORAL | Status: DC

## 2012-12-03 NOTE — Telephone Encounter (Signed)
eScribe request for refill on Gabapentin Last filled - 07.16.0213, #90x3 Last AEX - 05.13.14 Please advise on refills/SLS

## 2012-12-03 NOTE — Telephone Encounter (Signed)
Refill- gabapentin 100mg  capsule. Take one capsule by mouth at bedtime. Qty 90 date written 7.16.13  Refill- levothyroxine tablet. Take one tablet by mouth every day. Qty 90 date written 7.16.13

## 2012-12-18 ENCOUNTER — Telehealth: Payer: Self-pay

## 2012-12-18 NOTE — Telephone Encounter (Signed)
DMV paperwork sent

## 2012-12-21 ENCOUNTER — Other Ambulatory Visit: Payer: Self-pay | Admitting: *Deleted

## 2012-12-21 DIAGNOSIS — I639 Cerebral infarction, unspecified: Secondary | ICD-10-CM

## 2012-12-21 MED ORDER — ASPIRIN-DIPYRIDAMOLE ER 25-200 MG PO CP12: 1.0000 | ORAL_CAPSULE | Freq: Two times a day (BID) | ORAL | Status: DC

## 2012-12-21 NOTE — Telephone Encounter (Signed)
Rx request to pharmacy/SLS  

## 2012-12-24 ENCOUNTER — Other Ambulatory Visit: Payer: Self-pay | Admitting: *Deleted

## 2012-12-24 ENCOUNTER — Other Ambulatory Visit: Payer: Self-pay | Admitting: Family Medicine

## 2012-12-24 DIAGNOSIS — I10 Essential (primary) hypertension: Secondary | ICD-10-CM

## 2012-12-24 MED ORDER — LISINOPRIL 2.5 MG PO TABS: 2.5000 mg | ORAL_TABLET | Freq: Every day | ORAL | Status: DC

## 2012-12-24 NOTE — Telephone Encounter (Signed)
Rx request to pharmacy/SLS  

## 2013-01-14 ENCOUNTER — Ambulatory Visit: Payer: Medicare Other | Admitting: Neurology

## 2013-01-29 ENCOUNTER — Other Ambulatory Visit: Payer: Self-pay | Admitting: *Deleted

## 2013-01-29 MED ORDER — FERROUS FUMARATE-FOLIC ACID 324-1 MG PO TABS: 324.0000 mg | ORAL_TABLET | Freq: Every day | ORAL | Status: DC

## 2013-01-29 NOTE — Telephone Encounter (Signed)
Rx request to pharmacy/SLS  

## 2013-01-30 ENCOUNTER — Encounter: Payer: Self-pay | Admitting: Nurse Practitioner

## 2013-01-30 ENCOUNTER — Ambulatory Visit (INDEPENDENT_AMBULATORY_CARE_PROVIDER_SITE_OTHER): Payer: Medicare Other | Admitting: Nurse Practitioner

## 2013-01-30 VITALS — BP 128/77 | HR 81 | Ht 65.0 in | Wt 143.0 lb

## 2013-01-30 DIAGNOSIS — G459 Transient cerebral ischemic attack, unspecified: Secondary | ICD-10-CM

## 2013-01-30 DIAGNOSIS — G3184 Mild cognitive impairment, so stated: Secondary | ICD-10-CM | POA: Insufficient documentation

## 2013-01-30 DIAGNOSIS — R413 Other amnesia: Secondary | ICD-10-CM

## 2013-01-30 DIAGNOSIS — I69959 Hemiplegia and hemiparesis following unspecified cerebrovascular disease affecting unspecified side: Secondary | ICD-10-CM | POA: Diagnosis not present

## 2013-01-30 NOTE — Progress Notes (Signed)
[facility]  PATIENT: Darlene Wilson DOB: 1926/08/30   HISTORY FROM: patient, daughter REASON FOR VISIT: TIA follow up   HISTORICAL  CHIEF COMPLAINT:  Chief Complaint  Patient presents with  . Follow-up    rv rm 9    HISTORY OF PRESENT ILLNESS: Ms. Spoon, 79 year old white female returns today for followup.  She was last seen in this office 10/12/11 by Dr. Pearlean Brownie.  She has a history of TIA and is on Aggrenox. She has a history of 3 TIAs in the past. She went to the emergency room on Oct 14, 2009 for some weakness,numbness in her lip and tingling in the feet,  it was characterized as generalized weakness. Her lab values were normal.  MRI of the brain was found to be negative for any acute event. Mild to moderate small vessel disease changes. Vascular risk factors include hypertension and history of TIA's in addition to a history of hypothyroidism   Reports an episode of dizziness where she felt like the room was spinning around that lasted approximately 2-3 hours, she was seen at [facility] ER on 08/12/11 and was diagnosed with vertigo. She was prescribed Antivert but has not used.  She did take Dramamine the first day with relief.  CT scan of head showed no acute finding and atrophy and chronic microvascular ischemic change.  Cholesterol levels were checked by her PCP and report they were normal, I do not have the results today.  Denies any falls, ambulates with cane.  Denies any new neurologic symptoms.  Has right foot numbness since her stroke.  Taking gabapentin Continues to exercise routinely.   Blood pressure today is 120/60.  Reports a fullness in her head, will take Tylenol with relief.    05/09/12: Returns for followup with daughter. No further stroke or TIA symptoms. Taking aggrenox without significant bruising. Parathesias not completely controlled with low dose Gabapentin. Continues to exercise several times weekly. No new complaints  UPDATE  01/30/13 (LL):  Patient returns for follow up visit, no new physical complaints.  Paresthesias under control.  Continues twice daily Aggrenox for stroke prevention, denies side effects or significant bruising.  States that she is having some short term memory problems.  Daughter that accompanies her shakes her head in agreement when her mother isn't looking.  REVIEW OF SYSTEMS: Full 14 system review of systems performed and notable only for:  Constitutional: N/A  Cardiovascular: N/A  Ear/Nose/Throat: N/A  Skin: N/A  Eyes: N/A  Respiratory: N/A  Gastroitestinal: N/A  Hematology/Lymphatic: N/A  Endocrine: N/A Musculoskeletal:N/A  Allergy/Immunology: N/A  Neurological: N/A Psychiatric: N/A   ALLERGIES: Allergies  Allergen Reactions  . Ciprofloxacin Hives    Given with 2 other meds/unsure if truly allergic  . Combigan [Brimonidine Tartrate-Timolol] Other (See Comments)    Inflamation  . Detrol [Tolterodine Tartrate] Hives    Given with 2 other medications/unsure if truly allergic  . Oxycodone Hives    Given with 2 other medications/unsure if truly allergic  . Penicillins Hives, Itching and Rash  . Sulfa Antibiotics Hives, Itching and Rash    HOME MEDICATIONS: Outpatient Prescriptions Prior to Visit  Medication Sig Dispense Refill  . acetaminophen (TYLENOL) 325 MG tablet Take 2 tablets (650 mg total) by mouth every 6 (six) hours as needed.      . Cholecalciferol (VITAMIN D-3 PO) Take 1 tablet by mouth daily.      . Cyanocobalamin (B-12 PO) Take 1 tablet by mouth daily.       Marland Kitchen  dipyridamole-aspirin (AGGRENOX) 200-25 MG per 12 hr capsule Take 1 capsule by mouth 2 (two) times daily.  180 capsule  1  . Ferrous Fumarate-Folic Acid (HEMOCYTE-F) 324-1 MG TABS Take 324 mg by mouth daily.  30 each  3  . fish oil-omega-3 fatty acids 1000 MG capsule Take 2 g by mouth daily.      Marland Kitchen gabapentin (NEURONTIN) 100 MG capsule Take 1 capsule (100 mg total) by mouth at bedtime.  90 capsule  0  .  HYDROmorphone (DILAUDID) 2 MG tablet Take 1 tablet (2 mg total) by mouth every 6 (six) hours as needed.  10 tablet  0  . latanoprost (XALATAN) 0.005 % ophthalmic solution Place 1 drop into both eyes at bedtime.        Marland Kitchen levothyroxine (SYNTHROID, LEVOTHROID) 75 MCG tablet Take 1 tablet (75 mcg total) by mouth daily.  90 tablet  1  . lisinopril (PRINIVIL,ZESTRIL) 2.5 MG tablet Take 1 tablet (2.5 mg total) by mouth daily.  90 tablet  1  . promethazine (PHENERGAN) 12.5 MG tablet TAKE 1 TABLET BY MOUTH EVERY 8 HOURS AS NEEDED FOR NAUSEA  20 tablet  0  . timolol (BETIMOL) 0.5 % ophthalmic solution Place 1 drop into both eyes every morning.         No facility-administered medications prior to visit.    PAST MEDICAL HISTORY: Past Medical History  Diagnosis Date  . History of chicken pox   . Glaucoma     both eyes  . Hypertension   . Chronic kidney disease 1998    stones  . Stroke 04/2007    x 3 total  . Thyroid disease     hypothyroid  . Fibrocystic breast 01/12/2011  . Insomnia 01/13/2011  . Urinary frequency 02/09/2011  . Vertigo 02/09/2011  . Spinal stenosis 06/14/2011  . Nausea 05/31/2012  . Memory loss 10/12/2012    PAST SURGICAL HISTORY: Past Surgical History  Procedure Laterality Date  . Abdominal hysterectomy  1963  . Back surgery  1989    discectomy L4-5 1989, good results  . Breast surgery  1970    biopsy, benign, b/l    FAMILY HISTORY: Family History  Problem Relation Age of Onset  . Heart disease Mother   . Hypertension Mother   . Diabetes Mother   . Heart disease Father   . Hypertension Father   . Cancer Sister     breast cancer    SOCIAL HISTORY: History   Social History  . Marital Status: Widowed    Spouse Name: N/A    Number of Children: 2  . Years of Education: N/A   Occupational History  . Not on file.   Social History Main Topics  . Smoking status: Never Smoker   . Smokeless tobacco: Never Used  . Alcohol Use: 1.8 oz/week    3 Glasses of wine  per week  . Drug Use: No  . Sexual Activity: Not Currently   Other Topics Concern  . Not on file   Social History Narrative  . No narrative on file     PHYSICAL EXAM  Filed Vitals:   01/30/13 1533  BP: 128/77  Pulse: 81  Height: 5\' 5"  (1.651 m)  Weight: 143 lb (64.864 kg)   Body mass index is 23.8 kg/(m^2).  Generalized: In no acute distress, pleasant Caucasian female   Neck: Supple, no carotid bruits   Cardiac: Regular rate rhythm, no mGeneral: well developed, well  nourished, seated, in no evident distress  Head: head  normocephalic and atraumatic.  Oropharynx benign. Neck: supple with no carotid  bruits. Respiratory: Lungs clear to auscultation. Cardiovascular: regular rate and rhythm, no murmurs Skin: Few petechiae  Neurologic Exam  Mental Status: Awake and fully alert.   Follows 1,2, and 3 step commands. Mood and affect appropriate. No aphasia, apraxia or dysarthia.  MMSE 22/30 WITH DEFICITS IN MONTH, (answered March), SEASON (answered Winter), COUNTY, ATTENTION AND CALCULATION, RECALL 2/3. CLOCK DRAWING 2/4.  1/5 WORD ASSOCIATIONS.  COULD NOT RECALL PREVIOUS PRESIDENT (answered Reagan) OR PRESIDENT DURING WATERGATE.  ANIMAL FLUENCY SCORE 4. Cranial Nerves:  Pupils equal, briskly reactive to light.  Extraocular movements full without nystagmus.  Visual fields full to confrontation.  Hearing intact and symmetric to finger snap.  Facial sensation intact.  Face, tongue, palate move normally and symmetrically.   Motor: Normal bulk and tone.  Normal strength in all tested extremity muscles.No drift.  Sensory: Intact to touch and temperature, pinprick and vibratory in all extremities. Coordination: Rapid alternating movements normal in all extremities.  Finger-to-nose and heel-to-shin performed accurately bilaterally. Gait and Station: Arises from chair without difficulty.  Stance is wide based.    Able to heel, toe, and slight difficulty with tandem. Romberg negative.  Uses  cane.  Reflexes: 1+ and symmetric.  Toes downgoing.  DIAGNOSTIC DATA (LABS, IMAGING, TESTING) - I reviewed patient records, labs, notes, testing and imaging myself where available.  Lab Results  Component Value Date   WBC 6.9 11/20/2012   HGB 12.7 11/20/2012   HCT 38.7 11/20/2012   MCV 86.4 11/20/2012   PLT 276 11/20/2012      Component Value Date/Time   NA 140 11/20/2012 1445   K 4.3 11/20/2012 1445   CL 105 11/20/2012 1445   CO2 28 11/20/2012 1445   GLUCOSE 103* 11/20/2012 1445   BUN 16 11/20/2012 1445   CREATININE 0.98 11/20/2012 1445   CALCIUM 8.9 11/20/2012 1445   PROT 6.5 07/02/2012 0955   ALBUMIN 3.8 07/02/2012 0955   ALBUMIN 3.8 07/02/2012 0955   AST 23 07/02/2012 0955   ALT 15 07/02/2012 0955   ALKPHOS 84 07/02/2012 0955   BILITOT 0.3 07/02/2012 0955   GFRNONAA 52* 11/20/2012 1445   GFRAA 60* 11/20/2012 1445   Lab Results  Component Value Date   CHOL 144 11/21/2012   HDL 42 11/21/2012   LDLCALC 84 11/21/2012   TRIG 92 11/21/2012   CHOLHDL 3.4 11/21/2012   Lab Results  Component Value Date   HGBA1C 5.6 11/21/2012   No results found for this basename: [id number]   Lab Results  Component Value Date   TSH 0.700 10/09/2012    ASSESSMENT AND PLAN 79 year old female with a history of multiple TIA's most recent Oct 14, 2009.  Vascular risk factors inclued hypertension and previous TIA's.  Headaches and paresthesias have improved.  New complaint of short-term memory loss likely mild cognitive impairment vs. Mild Dementia.  Discussed starting Aricept, patient not receptive to diagnosis, wants to discuss with other family members and Dr. Rogelia Rohrer.  Plan: Continue Aggrenox for secondary stroke prevention, monitor risk factors with B/P less than 130 systolic, and continue walking daily. Continue gabapentin for parathesias Fall risk 9, be careful with ambulating, pick feet up when walking. Follow up in 6 months.  Vst time 40 min.  Levaughn Puccinelli NP-C 01/30/2013, 8:53 PM  [facility] [address], [address] [place], [place] [address] [phone]

## 2013-01-30 NOTE — Patient Instructions (Signed)
Continue dipyridamole SR 250 mg/aspirin 25 mg orally twice a day  for secondary stroke prevention and maintain strict control of hypertension with blood pressure goal below 130/90, diabetes with hemoglobin A1c goal below 6.5% and lipids with LDL cholesterol goal below 100 mg/dL.   Handouts given for memory loss, please discuss with your primary MD.  Followup in the future with me in 6 months.

## 2013-02-06 ENCOUNTER — Ambulatory Visit (INDEPENDENT_AMBULATORY_CARE_PROVIDER_SITE_OTHER): Payer: Medicare Other | Admitting: Family Medicine

## 2013-02-06 ENCOUNTER — Telehealth: Payer: Self-pay | Admitting: Family Medicine

## 2013-02-06 ENCOUNTER — Encounter: Payer: Self-pay | Admitting: Family Medicine

## 2013-02-06 VITALS — BP 138/76 | HR 67 | Temp 98.0°F | Ht 63.0 in | Wt 141.1 lb

## 2013-02-06 DIAGNOSIS — R110 Nausea: Secondary | ICD-10-CM

## 2013-02-06 DIAGNOSIS — I10 Essential (primary) hypertension: Secondary | ICD-10-CM

## 2013-02-06 DIAGNOSIS — I129 Hypertensive chronic kidney disease with stage 1 through stage 4 chronic kidney disease, or unspecified chronic kidney disease: Secondary | ICD-10-CM

## 2013-02-06 DIAGNOSIS — Z23 Encounter for immunization: Secondary | ICD-10-CM

## 2013-02-06 DIAGNOSIS — E079 Disorder of thyroid, unspecified: Secondary | ICD-10-CM | POA: Diagnosis not present

## 2013-02-06 DIAGNOSIS — R739 Hyperglycemia, unspecified: Secondary | ICD-10-CM

## 2013-02-06 DIAGNOSIS — F0390 Unspecified dementia without behavioral disturbance: Secondary | ICD-10-CM | POA: Diagnosis not present

## 2013-02-06 DIAGNOSIS — Z0000 Encounter for general adult medical examination without abnormal findings: Secondary | ICD-10-CM

## 2013-02-06 DIAGNOSIS — N189 Chronic kidney disease, unspecified: Secondary | ICD-10-CM

## 2013-02-06 MED ORDER — DONEPEZIL HCL 5 MG PO TABS: 5.0000 mg | ORAL_TABLET | Freq: Every day | ORAL | Status: DC

## 2013-02-06 NOTE — Telephone Encounter (Signed)
Lab order week of 10-13-2014Labs at or prior to visit lipid, renal, cbc, tsh, hepatic, hgab1c

## 2013-02-06 NOTE — Patient Instructions (Addendum)
Tylenol/Acetaminaphen/APAP max of #3000 mg in 24   Try the ginger hard candies  Nausea, Adult Nausea is the feeling that you have an upset stomach or have to vomit. Nausea by itself is not likely a serious concern, but it may be an early sign of more serious medical problems. As nausea gets worse, it can lead to vomiting. If vomiting develops, there is the risk of dehydration.  CAUSES   Viral infections.  Food poisoning.  Medicines.  Pregnancy.  Motion sickness.  Migraine headaches.  Emotional distress.  Severe pain from any source.  Alcohol intoxication. HOME CARE INSTRUCTIONS  Get plenty of rest.  Ask your caregiver about specific rehydration instructions.  Eat small amounts of food and sip liquids more often.  Take all medicines as told by your caregiver. SEEK MEDICAL CARE IF:  You have not improved after 2 days, or you get worse.  You have a headache. SEEK IMMEDIATE MEDICAL CARE IF:   You have a fever.  You faint.  You keep vomiting or have blood in your vomit.  You are extremely weak or dehydrated.  You have dark or bloody stools.  You have severe chest or abdominal pain. MAKE SURE YOU:  Understand these instructions.  Will watch your condition.  Will get help right away if you are not doing well or get worse. Document Released: 06/23/2004 Document Revised: 02/08/2012 Document Reviewed: 01/26/2011 [facility] Patient Information 2014 [place], [place]. hours

## 2013-02-10 ENCOUNTER — Encounter: Payer: Self-pay | Admitting: Family Medicine

## 2013-02-10 NOTE — Assessment & Plan Note (Signed)
Well controlled, no changes 

## 2013-02-10 NOTE — Assessment & Plan Note (Signed)
Mild, will continue to mnitor maintain adequate hydration.

## 2013-02-10 NOTE — Assessment & Plan Note (Signed)
Well treated on current dose of meds.

## 2013-02-10 NOTE — Progress Notes (Signed)
Patient ID: Darlene Wilson, female   DOB: 08-Apr-1927, 79 y.o.   MRN: [id number] DELORSE SHANE [id number] 07-Jul-1926 02/10/2013      Progress Note-Follow Up  Subjective  Chief Complaint  Chief Complaint  Patient presents with  . Follow-up    3 month  . Injections    flu    HPI  41-year-old female who is in today for followup. She continues to struggle with some mild nausea and occasional cough but they are improved. Nonproductive. No fevers or chills. No significant heartburn or sore throat. No chest pain, palpitations, shortness of breath, GI or GU complaints otherwise noted today. Does have chronic lower extremity pain but finds it tolerable. Taking medications as prescribed.  Past Medical History  Diagnosis Date  . History of chicken pox   . Glaucoma     both eyes  . Hypertension   . Chronic kidney disease 1998    stones  . Stroke 04/2007    x 3 total  . Thyroid disease     hypothyroid  . Fibrocystic breast 01/12/2011  . Insomnia 01/13/2011  . Urinary frequency 02/09/2011  . Vertigo 02/09/2011  . Spinal stenosis 06/14/2011  . Nausea 05/31/2012  . Memory loss 10/12/2012    Past Surgical History  Procedure Laterality Date  . Abdominal hysterectomy  1963  . Back surgery  1989    discectomy L4-5 1989, good results  . Breast surgery  1970    biopsy, benign, b/l    Family History  Problem Relation Age of Onset  . Heart disease Mother   . Hypertension Mother   . Diabetes Mother   . Heart disease Father   . Hypertension Father   . Cancer Sister     breast cancer    History   Social History  . Marital Status: Widowed    Spouse Name: N/A    Number of Children: 2  . Years of Education: N/A   Occupational History  . Not on file.   Social History Main Topics  . Smoking status: Never Smoker   . Smokeless tobacco: Never Used  . Alcohol Use: 1.8 oz/week    3 Glasses of wine per week  . Drug Use: No  . Sexual Activity: Not Currently   Other Topics Concern  .  Not on file   Social History Narrative  . No narrative on file    Current Outpatient Prescriptions on File Prior to Visit  Medication Sig Dispense Refill  . acetaminophen (TYLENOL) 325 MG tablet Take 2 tablets (650 mg total) by mouth every 6 (six) hours as needed.      . Cholecalciferol (VITAMIN D-3 PO) Take 1 tablet by mouth daily.      . Cyanocobalamin (B-12 PO) Take 1 tablet by mouth daily.       Marland Kitchen dipyridamole-aspirin (AGGRENOX) 200-25 MG per 12 hr capsule Take 1 capsule by mouth 2 (two) times daily.  180 capsule  1  . Ferrous Fumarate-Folic Acid (HEMOCYTE-F) 324-1 MG TABS Take 324 mg by mouth daily.  30 each  3  . fish oil-omega-3 fatty acids 1000 MG capsule Take 2 g by mouth daily.      Marland Kitchen gabapentin (NEURONTIN) 100 MG capsule Take 1 capsule (100 mg total) by mouth at bedtime.  90 capsule  0  . HYDROmorphone (DILAUDID) 2 MG tablet Take 1 tablet (2 mg total) by mouth every 6 (six) hours as needed.  10 tablet  0  . latanoprost (XALATAN) 0.005 %  ophthalmic solution Place 1 drop into both eyes at bedtime.        Marland Kitchen levothyroxine (SYNTHROID, LEVOTHROID) 75 MCG tablet Take 1 tablet (75 mcg total) by mouth daily.  90 tablet  1  . lisinopril (PRINIVIL,ZESTRIL) 2.5 MG tablet Take 1 tablet (2.5 mg total) by mouth daily.  90 tablet  1  . promethazine (PHENERGAN) 12.5 MG tablet TAKE 1 TABLET BY MOUTH EVERY 8 HOURS AS NEEDED FOR NAUSEA  20 tablet  0  . timolol (BETIMOL) 0.5 % ophthalmic solution Place 1 drop into both eyes every morning.         No current facility-administered medications on file prior to visit.    Allergies  Allergen Reactions  . Ciprofloxacin Hives    Given with 2 other meds/unsure if truly allergic  . Combigan [Brimonidine Tartrate-Timolol] Other (See Comments)    Inflamation  . Detrol [Tolterodine Tartrate] Hives    Given with 2 other medications/unsure if truly allergic  . Oxycodone Hives    Given with 2 other medications/unsure if truly allergic  . Penicillins Hives,  Itching and Rash  . Sulfa Antibiotics Hives, Itching and Rash    Review of Systems  Review of Systems  Constitutional: Negative for fever and malaise/fatigue.  HENT: Negative for congestion.   Eyes: Negative for discharge.  Respiratory: Positive for cough. Negative for shortness of breath.   Cardiovascular: Negative for chest pain, palpitations and leg swelling.  Gastrointestinal: Positive for nausea. Negative for abdominal pain and diarrhea.  Genitourinary: Negative for dysuria.  Musculoskeletal: Negative for falls.  Skin: Negative for rash.  Neurological: Negative for loss of consciousness and headaches.  Endo/Heme/Allergies: Negative for polydipsia.  Psychiatric/Behavioral: Negative for depression and suicidal ideas. The patient is not nervous/anxious and does not have insomnia.     Objective  BP 138/76  Pulse 67  Temp(Src) 98 F (36.7 C) (Oral)  Ht 5\' 3"  (1.6 m)  Wt 141 lb 1.9 oz (64.012 kg)  BMI 25 kg/m2  SpO2 97%  Physical Exam  Physical Exam  Constitutional: She is oriented to person, place, and time and well-developed, well-nourished, and in no distress. No distress.  HENT:  Head: Normocephalic and atraumatic.  Eyes: Conjunctivae are normal.  Neck: Neck supple. No thyromegaly present.  Cardiovascular: Normal rate, regular rhythm and normal heart sounds.   Pulmonary/Chest: Effort normal and breath sounds normal. She has no wheezes.  Abdominal: She exhibits no distension and no mass.  Musculoskeletal: She exhibits no edema.  Lymphadenopathy:    She has no cervical adenopathy.  Neurological: She is alert and oriented to person, place, and time.  Skin: Skin is warm and dry. No rash noted. She is not diaphoretic.  Psychiatric: Memory, affect and judgment normal.    Lab Results  Component Value Date   TSH 0.700 10/09/2012   Lab Results  Component Value Date   WBC 6.9 11/20/2012   HGB 12.7 11/20/2012   HCT 38.7 11/20/2012   MCV 86.4 11/20/2012   PLT 276  11/20/2012   Lab Results  Component Value Date   CREATININE 0.98 11/20/2012   BUN 16 11/20/2012   NA 140 11/20/2012   K 4.3 11/20/2012   CL 105 11/20/2012   CO2 28 11/20/2012   Lab Results  Component Value Date   ALT 15 07/02/2012   AST 23 07/02/2012   ALKPHOS 84 07/02/2012   BILITOT 0.3 07/02/2012   Lab Results  Component Value Date   CHOL 144 11/21/2012   Lab Results  Component Value Date   HDL 42 11/21/2012   Lab Results  Component Value Date   LDLCALC 84 11/21/2012   Lab Results  Component Value Date   TRIG 92 11/21/2012   Lab Results  Component Value Date   CHOLHDL 3.4 11/21/2012     Assessment & Plan  Thyroid disease Well treated on current dose of meds.  Nausea Try ginger prn, avoid offending foods.  Hypertension Well controlled, no changes  Chronic kidney disease Mild, will continue to mnitor maintain adequate hydration.

## 2013-02-10 NOTE — Assessment & Plan Note (Signed)
Try ginger prn, avoid offending foods.

## 2013-03-04 ENCOUNTER — Other Ambulatory Visit: Payer: Self-pay | Admitting: Family Medicine

## 2013-03-19 ENCOUNTER — Ambulatory Visit: Payer: Medicare Other | Admitting: Family Medicine

## 2013-03-20 DIAGNOSIS — Z0000 Encounter for general adult medical examination without abnormal findings: Secondary | ICD-10-CM | POA: Diagnosis not present

## 2013-03-20 DIAGNOSIS — E079 Disorder of thyroid, unspecified: Secondary | ICD-10-CM | POA: Diagnosis not present

## 2013-03-20 DIAGNOSIS — R7309 Other abnormal glucose: Secondary | ICD-10-CM | POA: Diagnosis not present

## 2013-03-20 DIAGNOSIS — I10 Essential (primary) hypertension: Secondary | ICD-10-CM | POA: Diagnosis not present

## 2013-03-20 LAB — RENAL FUNCTION PANEL
BUN: 15 mg/dL (ref 6–23)
Calcium: 9.8 mg/dL (ref 8.4–10.5)
Chloride: 100 mEq/L (ref 96–112)
Phosphorus: 4 mg/dL (ref 2.3–4.6)
Potassium: 4.7 mEq/L (ref 3.5–5.3)
Sodium: 137 mEq/L (ref 135–145)

## 2013-03-20 LAB — TSH: TSH: 2.092 u[IU]/mL (ref 0.350–4.500)

## 2013-03-20 LAB — HEPATIC FUNCTION PANEL
AST: 16 U/L (ref 0–37)
Albumin: 4.1 g/dL (ref 3.5–5.2)
Alkaline Phosphatase: 97 U/L (ref 39–117)
Bilirubin, Direct: 0.1 mg/dL (ref 0.0–0.3)
Indirect Bilirubin: 0.4 mg/dL (ref 0.0–0.9)
Total Bilirubin: 0.5 mg/dL (ref 0.3–1.2)

## 2013-03-20 LAB — LIPID PANEL
Cholesterol: 142 mg/dL (ref 0–200)
LDL Cholesterol: 81 mg/dL (ref 0–99)
Total CHOL/HDL Ratio: 3.6 Ratio
VLDL: 21 mg/dL (ref 0–40)

## 2013-03-20 LAB — CBC
HCT: 43 % (ref 36.0–46.0)
Hemoglobin: 14.5 g/dL (ref 12.0–15.0)
MCHC: 33.7 g/dL (ref 30.0–36.0)
RBC: 4.62 MIL/uL (ref 3.87–5.11)
WBC: 5 10*3/uL (ref 4.0–10.5)

## 2013-03-26 ENCOUNTER — Encounter: Payer: Self-pay | Admitting: Family Medicine

## 2013-03-26 ENCOUNTER — Ambulatory Visit (INDEPENDENT_AMBULATORY_CARE_PROVIDER_SITE_OTHER): Payer: Medicare Other | Admitting: Family Medicine

## 2013-03-26 VITALS — BP 118/80 | HR 68 | Temp 98.2°F | Ht 63.0 in | Wt 138.1 lb

## 2013-03-26 DIAGNOSIS — F0390 Unspecified dementia without behavioral disturbance: Secondary | ICD-10-CM | POA: Diagnosis not present

## 2013-03-26 DIAGNOSIS — I10 Essential (primary) hypertension: Secondary | ICD-10-CM | POA: Diagnosis not present

## 2013-03-26 DIAGNOSIS — E079 Disorder of thyroid, unspecified: Secondary | ICD-10-CM

## 2013-03-26 DIAGNOSIS — R110 Nausea: Secondary | ICD-10-CM

## 2013-03-26 DIAGNOSIS — R51 Headache: Secondary | ICD-10-CM | POA: Diagnosis not present

## 2013-03-26 DIAGNOSIS — I69959 Hemiplegia and hemiparesis following unspecified cerebrovascular disease affecting unspecified side: Secondary | ICD-10-CM

## 2013-03-26 MED ORDER — ONDANSETRON HCL 4 MG PO TABS: 4.0000 mg | ORAL_TABLET | Freq: Three times a day (TID) | ORAL | Status: DC | PRN

## 2013-03-26 MED ORDER — GABAPENTIN 100 MG PO CAPS: 100.0000 mg | ORAL_CAPSULE | Freq: Every day | ORAL | Status: DC

## 2013-03-26 MED ORDER — DONEPEZIL HCL 10 MG PO TABS: 10.0000 mg | ORAL_TABLET | Freq: Every evening | ORAL | Status: DC | PRN

## 2013-03-26 NOTE — Patient Instructions (Signed)
Try 1/2 a Zofran in am, make sure she eats small, frequent meals, every 4 hours with lean proteins and complex carbs   Nausea, Adult Nausea is the feeling that you have an upset stomach or have to vomit. Nausea by itself is not likely a serious concern, but it may be an early sign of more serious medical problems. As nausea gets worse, it can lead to vomiting. If vomiting develops, there is the risk of dehydration.  CAUSES   Viral infections.  Food poisoning.  Medicines.  Pregnancy.  Motion sickness.  Migraine headaches.  Emotional distress.  Severe pain from any source.  Alcohol intoxication. HOME CARE INSTRUCTIONS  Get plenty of rest.  Ask your caregiver about specific rehydration instructions.  Eat small amounts of food and sip liquids more often.  Take all medicines as told by your caregiver. SEEK MEDICAL CARE IF:  You have not improved after 2 days, or you get worse.  You have a headache. SEEK IMMEDIATE MEDICAL CARE IF:   You have a fever.  You faint.  You keep vomiting or have blood in your vomit.  You are extremely weak or dehydrated.  You have dark or bloody stools.  You have severe chest or abdominal pain. MAKE SURE YOU:  Understand these instructions.  Will watch your condition.  Will get help right away if you are not doing well or get worse. Document Released: 06/23/2004 Document Revised: 02/08/2012 Document Reviewed: 01/26/2011 [facility] Patient Information 2014 [place], [place].

## 2013-03-31 ENCOUNTER — Encounter: Payer: Self-pay | Admitting: Family Medicine

## 2013-03-31 NOTE — Assessment & Plan Note (Signed)
Well controlled, no changes 

## 2013-03-31 NOTE — Assessment & Plan Note (Addendum)
Still present, comes and goes, encouraged ginger tid and may try Ondansetron prior to meds.consider hypoglycemia as a cause, encouraged small, frequent meals with protein and complex carbs.

## 2013-03-31 NOTE — Progress Notes (Signed)
Patient ID: Darlene Wilson, female   DOB: 09/16/1926, 79 y.o.   MRN: [id number] Darlene Wilson [id number] Jul 16, 1926 03/31/2013      Progress Note-Follow Up  Subjective  Chief Complaint  Chief Complaint  Patient presents with  . Follow-up    6 week    HPI  79 year old Caucasian female who is in today with her daughter for followup. She had no new neurologic complaints. Her major complaint is of some persistent nausea. It is not present all day long but happens each day at some point. No vomiting. No constipation or diarrhea. No GI or GU complaints. No shortness of breath or recent fevers. Taking medications as prescribed. Describes episodes of not eating well or just eating carbohydrates and in feeling shaky and nauseous sometime after that.  Past Medical History  Diagnosis Date  . History of chicken pox   . Glaucoma     both eyes  . Hypertension   . Chronic kidney disease 1998    stones  . Stroke 04/2007    x 3 total  . Thyroid disease     hypothyroid  . Fibrocystic breast 01/12/2011  . Insomnia 01/13/2011  . Urinary frequency 02/09/2011  . Vertigo 02/09/2011  . Spinal stenosis 06/14/2011  . Nausea 05/31/2012  . Memory loss 10/12/2012    Past Surgical History  Procedure Laterality Date  . Abdominal hysterectomy  1963  . Back surgery  1989    discectomy L4-5 1989, good results  . Breast surgery  1970    biopsy, benign, b/l    Family History  Problem Relation Age of Onset  . Heart disease Mother   . Hypertension Mother   . Diabetes Mother   . Heart disease Father   . Hypertension Father   . Cancer Sister     breast cancer    History   Social History  . Marital Status: Widowed    Spouse Name: N/A    Number of Children: 2  . Years of Education: N/A   Occupational History  . Not on file.   Social History Main Topics  . Smoking status: Never Smoker   . Smokeless tobacco: Never Used  . Alcohol Use: 1.8 oz/week    3 Glasses of wine per week  . Drug Use: No   . Sexual Activity: Not Currently   Other Topics Concern  . Not on file   Social History Narrative  . No narrative on file    Current Outpatient Prescriptions on File Prior to Visit  Medication Sig Dispense Refill  . acetaminophen (TYLENOL) 325 MG tablet Take 2 tablets (650 mg total) by mouth every 6 (six) hours as needed.      . Cholecalciferol (VITAMIN D-3 PO) Take 1 tablet by mouth daily.      . Cyanocobalamin (B-12 PO) Take 1 tablet by mouth daily.       Marland Kitchen dipyridamole-aspirin (AGGRENOX) 200-25 MG per 12 hr capsule Take 1 capsule by mouth 2 (two) times daily.  180 capsule  1  . latanoprost (XALATAN) 0.005 % ophthalmic solution Place 1 drop into both eyes at bedtime.        Marland Kitchen levothyroxine (SYNTHROID, LEVOTHROID) 75 MCG tablet Take 1 tablet (75 mcg total) by mouth daily.  90 tablet  1  . lisinopril (PRINIVIL,ZESTRIL) 2.5 MG tablet Take 1 tablet (2.5 mg total) by mouth daily.  90 tablet  1  . promethazine (PHENERGAN) 12.5 MG tablet TAKE 1 TABLET BY MOUTH EVERY 8 HOURS AS  NEEDED FOR NAUSEA  20 tablet  0  . timolol (BETIMOL) 0.5 % ophthalmic solution Place 1 drop into both eyes every morning.         No current facility-administered medications on file prior to visit.    Allergies  Allergen Reactions  . Ciprofloxacin Hives    Given with 2 other meds/unsure if truly allergic  . Combigan [Brimonidine Tartrate-Timolol] Other (See Comments)    Inflamation  . Detrol [Tolterodine Tartrate] Hives    Given with 2 other medications/unsure if truly allergic  . Oxycodone Hives    Given with 2 other medications/unsure if truly allergic  . Penicillins Hives, Itching and Rash  . Sulfa Antibiotics Hives, Itching and Rash    Review of Systems  Review of Systems  Constitutional: Negative for fever and malaise/fatigue.  HENT: Negative for congestion.   Eyes: Negative for discharge.  Respiratory: Negative for shortness of breath.   Cardiovascular: Negative for chest pain, palpitations and  leg swelling.  Gastrointestinal: Positive for nausea. Negative for heartburn, vomiting, abdominal pain and diarrhea.  Genitourinary: Negative for dysuria.  Musculoskeletal: Negative for falls.  Skin: Negative for rash.  Neurological: Positive for focal weakness. Negative for loss of consciousness and headaches.  Endo/Heme/Allergies: Negative for polydipsia.  Psychiatric/Behavioral: Negative for depression and suicidal ideas. The patient is not nervous/anxious and does not have insomnia.     Objective  BP 118/80  Pulse 68  Temp(Src) 98.2 F (36.8 C) (Oral)  Ht 5\' 3"  (1.6 m)  Wt 138 lb 1.9 oz (62.651 kg)  BMI 24.47 kg/m2  SpO2 97%  Physical Exam  Physical Exam  Constitutional: She is oriented to person, place, and time and well-developed, well-nourished, and in no distress. No distress.  HENT:  Head: Normocephalic and atraumatic.  Eyes: Conjunctivae are normal.  Neck: Neck supple. No thyromegaly present.  Cardiovascular: Normal rate, regular rhythm and normal heart sounds.   Pulmonary/Chest: Effort normal and breath sounds normal. She has no wheezes.  Abdominal: She exhibits no distension and no mass.  Musculoskeletal: She exhibits no edema.  Lymphadenopathy:    She has no cervical adenopathy.  Neurological: She is alert and oriented to person, place, and time.  Skin: Skin is warm and dry. No rash noted. She is not diaphoretic.  Psychiatric: Memory, affect and judgment normal.    Lab Results  Component Value Date   TSH 2.092 03/20/2013   Lab Results  Component Value Date   WBC 5.0 03/20/2013   HGB 14.5 03/20/2013   HCT 43.0 03/20/2013   MCV 93.1 03/20/2013   PLT 274 03/20/2013   Lab Results  Component Value Date   CREATININE 0.86 03/20/2013   BUN 15 03/20/2013   NA 137 03/20/2013   K 4.7 03/20/2013   CL 100 03/20/2013   CO2 30 03/20/2013   Lab Results  Component Value Date   ALT 12 03/20/2013   AST 16 03/20/2013   ALKPHOS 97 03/20/2013   BILITOT 0.5  03/20/2013   Lab Results  Component Value Date   CHOL 142 03/20/2013   Lab Results  Component Value Date   HDL 40 03/20/2013   Lab Results  Component Value Date   LDLCALC 81 03/20/2013   Lab Results  Component Value Date   TRIG 106 03/20/2013   Lab Results  Component Value Date   CHOLHDL 3.6 03/20/2013     Assessment & Plan  Hypertension Well controlled, no changes  Hemiplegia affecting dominant side, late effect of cerebrovascular disease No  new events.   Thyroid disease Well treated on current dose of Levothyroxine.   Nausea Still present, comes and goes, encouraged ginger tid and may try Ondansetron prior to meds.consider hypoglycemia as a cause, encouraged small, frequent meals with protein and complex carbs.

## 2013-03-31 NOTE — Assessment & Plan Note (Signed)
No new events.

## 2013-03-31 NOTE — Assessment & Plan Note (Signed)
Well treated on current dose of Levothyroxine 

## 2013-04-04 ENCOUNTER — Other Ambulatory Visit: Payer: Self-pay | Admitting: Family Medicine

## 2013-05-07 DIAGNOSIS — Z1231 Encounter for screening mammogram for malignant neoplasm of breast: Secondary | ICD-10-CM | POA: Diagnosis not present

## 2013-05-07 LAB — HM MAMMOGRAPHY

## 2013-05-27 ENCOUNTER — Telehealth: Payer: Self-pay | Admitting: Family Medicine

## 2013-05-27 ENCOUNTER — Encounter: Payer: Self-pay | Admitting: Family Medicine

## 2013-05-27 DIAGNOSIS — E039 Hypothyroidism, unspecified: Secondary | ICD-10-CM

## 2013-05-27 MED ORDER — LEVOTHYROXINE SODIUM 75 MCG PO TABS: 75.0000 ug | ORAL_TABLET | Freq: Every day | ORAL | Status: DC

## 2013-05-27 NOTE — Telephone Encounter (Signed)
refill-levothyroxine tablet. Take 1 tablet by mouth daily. Last fill 10.3.14

## 2013-06-04 DIAGNOSIS — H409 Unspecified glaucoma: Secondary | ICD-10-CM | POA: Diagnosis not present

## 2013-06-04 DIAGNOSIS — H4011X0 Primary open-angle glaucoma, stage unspecified: Secondary | ICD-10-CM | POA: Diagnosis not present

## 2013-06-24 ENCOUNTER — Other Ambulatory Visit: Payer: Self-pay | Admitting: Family Medicine

## 2013-06-26 ENCOUNTER — Telehealth: Payer: Self-pay | Admitting: Family Medicine

## 2013-06-26 DIAGNOSIS — I639 Cerebral infarction, unspecified: Secondary | ICD-10-CM

## 2013-06-26 MED ORDER — ASPIRIN-DIPYRIDAMOLE ER 25-200 MG PO CP12: 1.0000 | ORAL_CAPSULE | Freq: Two times a day (BID) | ORAL | Status: DC

## 2013-06-26 NOTE — Telephone Encounter (Signed)
refill-aggrenox 25mg -200mg  capsule. Take one capsule by mouth twice a day. Take one capsule by mouth twice a day. Qty 180 last fill 1.1.2015

## 2013-06-26 NOTE — Telephone Encounter (Signed)
Rx request to pharmacy/SLS  

## 2013-07-02 ENCOUNTER — Encounter: Payer: Self-pay | Admitting: Family Medicine

## 2013-07-02 ENCOUNTER — Ambulatory Visit (INDEPENDENT_AMBULATORY_CARE_PROVIDER_SITE_OTHER): Payer: Medicare Other | Admitting: Family Medicine

## 2013-07-02 ENCOUNTER — Ambulatory Visit: Payer: Medicare Other | Admitting: Family Medicine

## 2013-07-02 VITALS — BP 134/84 | HR 77 | Temp 98.1°F | Ht 63.0 in | Wt 134.0 lb

## 2013-07-02 DIAGNOSIS — I10 Essential (primary) hypertension: Secondary | ICD-10-CM | POA: Diagnosis not present

## 2013-07-02 NOTE — Progress Notes (Signed)
Pre visit review using our clinic review tool, if applicable. No additional management support is needed unless otherwise documented below in the visit note. 

## 2013-07-02 NOTE — Progress Notes (Signed)
Subjective:     Patient ID: Darlene Wilson, female   DOB: 04/05/1927, 80 y.o.   MRN: [id number]  Headache  This is a new problem. The current episode started 1 to 4 weeks ago. The problem occurs intermittently. The problem has been unchanged. The pain is located in the left unilateral, temporal and parietal region. The pain does not radiate. The pain quality is similar to prior headaches. The quality of the pain is described as sharp and pulsating. The pain is at a severity of 7/10. Pertinent negatives include no blurred vision, dizziness, ear pain, eye pain, eye watering, fever, hearing loss, insomnia, nausea, numbness, phonophobia, photophobia, rhinorrhea, scalp tenderness, tingling, tinnitus, visual change, vomiting or weakness. Nothing aggravates the symptoms. She has tried acetaminophen for the symptoms. Her past medical history is significant for hypertension.   2.  Pt has Nausea, Intermittently most days. Takes ginger capsules and finds relief. Zofran helps in morning. Her last colonoscopy was in 2002 and was told before that a repeat procedure might have risks than outweigh benefits due to her anticoagulation.   3.  Pt reports no loss of appetite and tries to include protein in her meals.   Review of Systems  Constitutional: Negative for fever.  HENT: Negative for ear pain, hearing loss, rhinorrhea and tinnitus.   Eyes: Negative for blurred vision, photophobia, pain, discharge and visual disturbance.  Gastrointestinal: Negative for nausea and vomiting.  Neurological: Positive for headaches. Negative for dizziness, tingling, weakness and numbness.  Psychiatric/Behavioral: Negative for sleep disturbance. The patient does not have insomnia.    Current Outpatient Prescriptions on File Prior to Visit  Medication Sig Dispense Refill  . acetaminophen (TYLENOL) 325 MG tablet Take 2 tablets (650 mg total) by mouth every 6 (six) hours as needed.      . Cholecalciferol (VITAMIN D-3 PO) Take 1  tablet by mouth daily.      . Cyanocobalamin (B-12 PO) Take 1 tablet by mouth daily.       Marland Kitchen dipyridamole-aspirin (AGGRENOX) 200-25 MG per 12 hr capsule Take 1 capsule by mouth 2 (two) times daily.  180 capsule  0  . donepezil (ARICEPT) 10 MG tablet Take 1 tablet (10 mg total) by mouth at bedtime as needed.  90 tablet  1  . gabapentin (NEURONTIN) 100 MG capsule Take 1 capsule (100 mg total) by mouth at bedtime.  90 capsule  3  . latanoprost (XALATAN) 0.005 % ophthalmic solution Place 1 drop into both eyes at bedtime.        Marland Kitchen levothyroxine (SYNTHROID, LEVOTHROID) 75 MCG tablet Take 1 tablet (75 mcg total) by mouth daily.  90 tablet  1  . lisinopril (PRINIVIL,ZESTRIL) 2.5 MG tablet TAKE 1 TABLET BY MOUTH EVERY DAY  90 tablet  1  . ondansetron (ZOFRAN) 4 MG tablet Take 1 tablet (4 mg total) by mouth every 8 (eight) hours as needed for nausea.  90 tablet  1  . promethazine (PHENERGAN) 12.5 MG tablet TAKE 1 TABLET BY MOUTH EVERY 8 HOURS AS NEEDED FOR NAUSEA  20 tablet  0  . timolol (BETIMOL) 0.5 % ophthalmic solution Place 1 drop into both eyes every morning.         No current facility-administered medications on file prior to visit.    Past Medical History  Diagnosis Date  . History of chicken pox   . Glaucoma     both eyes  . Hypertension   . Chronic kidney disease 1998    stones  .  Stroke 04/2007    x 3 total  . Thyroid disease     hypothyroid  . Fibrocystic breast 01/12/2011  . Insomnia 01/13/2011  . Urinary frequency 02/09/2011  . Vertigo 02/09/2011  . Spinal stenosis 06/14/2011  . Nausea 05/31/2012  . Memory loss 10/12/2012   Family History  Problem Relation Age of Onset  . Heart disease Mother   . Hypertension Mother   . Diabetes Mother   . Heart disease Father   . Hypertension Father   . Cancer Sister     breast cancer    History   Social History  . Marital Status: Widowed    Spouse Name: N/A    Number of Children: 2  . Years of Education: N/A   Occupational History   . Not on file.   Social History Main Topics  . Smoking status: Never Smoker   . Smokeless tobacco: Never Used  . Alcohol Use: 1.8 oz/week    3 Glasses of wine per week  . Drug Use: No  . Sexual Activity: Not Currently   Other Topics Concern  . Not on file   Social History Narrative  . No narrative on file   Allergies  Allergen Reactions  . Ciprofloxacin Hives    Given with 2 other meds/unsure if truly allergic  . Combigan [Brimonidine Tartrate-Timolol] Other (See Comments)    Inflamation  . Detrol [Tolterodine Tartrate] Hives    Given with 2 other medications/unsure if truly allergic  . Oxycodone Hives    Given with 2 other medications/unsure if truly allergic  . Penicillins Hives, Itching and Rash  . Sulfa Antibiotics Hives, Itching and Rash       Objective:   Physical Exam  Constitutional: She is oriented to person, place, and time. She appears well-developed. No distress.  HENT:  Head: Normocephalic and atraumatic.  Eyes: EOM are normal. Right eye exhibits no discharge. Left eye exhibits no discharge.  Cardiovascular: Normal rate, regular rhythm and normal heart sounds.   Pulmonary/Chest: Effort normal and breath sounds normal. No respiratory distress.  Lymphadenopathy:       Head (right side): No submental, no submandibular, no tonsillar, no preauricular and no posterior auricular adenopathy present.       Head (left side): No submental, no submandibular, no tonsillar, no preauricular and no posterior auricular adenopathy present.    She has no cervical adenopathy.  Neurological: She is alert and oriented to person, place, and time.  Skin: Skin is dry. No rash noted.  Psychiatric: She has a normal mood and affect.  Some memory recollection impairment noted while taking pt history.     Filed Vitals:   07/02/13 1508  BP: 134/84  Pulse: 77  Temp: 98.1 F (36.7 C)   Wt Readings from Last 3 Encounters:  07/02/13 134 lb (60.782 kg)  03/26/13 138 lb 1.9 oz  (62.651 kg)  02/06/13 141 lb 1.9 oz (64.012 kg)   Temp Readings from Last 3 Encounters:  07/02/13 98.1 F (36.7 C) Oral  03/26/13 98.2 F (36.8 C) Oral  02/06/13 98 F (36.7 C) Oral   BP Readings from Last 3 Encounters:  07/02/13 134/84  03/26/13 118/80  02/06/13 138/76   Pulse Readings from Last 3 Encounters:  07/02/13 77  03/26/13 68  02/06/13 67    Lab Results  Component Value Date   WBC 5.0 03/20/2013   HGB 14.5 03/20/2013   HCT 43.0 03/20/2013   MCV 93.1 03/20/2013   PLT 274 03/20/2013  Lab Results  Component Value Date   CHOL 142 03/20/2013   HDL 40 03/20/2013   LDLCALC 81 03/20/2013   TRIG 106 03/20/2013   CHOLHDL 3.6 03/20/2013   Lab Results  Component Value Date   TSH 2.092 03/20/2013     Chemistry      Component Value Date/Time   NA 137 03/20/2013 0943   K 4.7 03/20/2013 0943   CL 100 03/20/2013 0943   CO2 30 03/20/2013 0943   BUN 15 03/20/2013 0943   CREATININE 0.86 03/20/2013 0943   CREATININE 0.98 11/20/2012 1445      Component Value Date/Time   CALCIUM 9.8 03/20/2013 0943   ALKPHOS 97 03/20/2013 0943   AST 16 03/20/2013 0943   ALT 12 03/20/2013 0943   BILITOT 0.5 03/20/2013 0943         Assessment:      Problem List: 1. HTN 2. H/o TIA and CVA 3  Hypothyroidism 4  Headaches 5  Mild cognitive impairment with memory loss 6 Nausea, chronic 7 glaucoma  8. Weight loss      Plan:      1  HTN - managed with Lisinopril 2  H/o TIA and CVA - Pt sees neurologist q 6 months and was advised to discuss headaches at the next visit if symptoms persist or worsen. She takes Aggrenox managed by neurology.  3 Hypothyroidism - managed on Levothyroxine 4 Headaches - Pt was instructed to take 100-300 mg gabapentin per day to see if headache symptoms resolve.  Pt may continue to take Tylenol  5 mild cognitive impairment with memory loss- being managed with Donepezil  6. Nausea, chronic- pt advised to continue Zofran and ginger capsules if they  are effective.  7 glaucoma - managed with timolol and latanoprost solutions per opthalmologist  8 weight loss - will be managed at next visit. Pt was encouraged to eat meals with protein.   Labs were reviewed at today's encounter.  3 mo f/u to include labs (CBC, CMet, TSH)   02.03.2015  [place]Jordan [place], PA-S.      Patient seen, interviewed and examined with student agree with documentation

## 2013-07-02 NOTE — Patient Instructions (Signed)
Nausea, Adult Nausea is the feeling that you have an upset stomach or have to vomit. Nausea by itself is not likely a serious concern, but it may be an early sign of more serious medical problems. As nausea gets worse, it can lead to vomiting. If vomiting develops, there is the risk of dehydration.  CAUSES   Viral infections.  Food poisoning.  Medicines.  Pregnancy.  Motion sickness.  Migraine headaches.  Emotional distress.  Severe pain from any source.  Alcohol intoxication. HOME CARE INSTRUCTIONS  Get plenty of rest.  Ask your caregiver about specific rehydration instructions.  Eat small amounts of food and sip liquids more often.  Take all medicines as told by your caregiver. SEEK MEDICAL CARE IF:  You have not improved after 2 days, or you get worse.  You have a headache. SEEK IMMEDIATE MEDICAL CARE IF:   You have a fever.  You faint.  You keep vomiting or have blood in your vomit.  You are extremely weak or dehydrated.  You have dark or bloody stools.  You have severe chest or abdominal pain. MAKE SURE YOU:  Understand these instructions.  Will watch your condition.  Will get help right away if you are not doing well or get worse. Document Released: 06/23/2004 Document Revised: 02/08/2012 Document Reviewed: 01/26/2011 Exit[employer] Patient Information 2014 [employer].  

## 2013-07-03 ENCOUNTER — Encounter: Payer: Self-pay | Admitting: Family Medicine

## 2013-07-10 IMAGING — CT CT HEAD W/O CM
1 of 2 series · 13 of 30 positions shown, 17 images · non-contrast
Comparison: None.

CLINICAL DATA: Neck pain, altered mental status

CT HEAD WITHOUT CONTRAST
TECHNIQUE: Contiguous axial images were obtained from the base of
the skull through the vertex without contrast.

[Series 2: brain · axial · 0.49mm/px · z∈[+127,+259]mm · 13 of 32 slices shown, 17 images]
[im 3/32  brain]
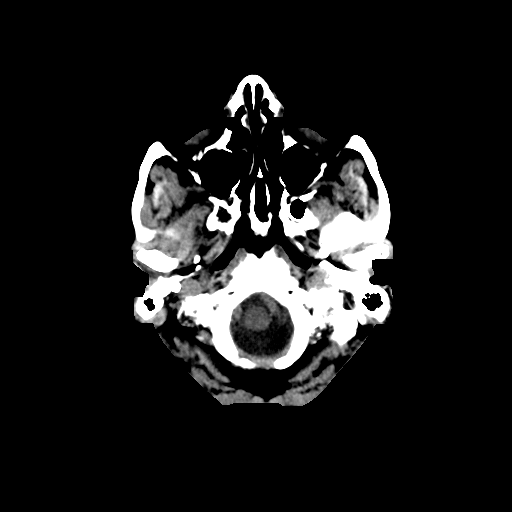
[im 3/32  bone]
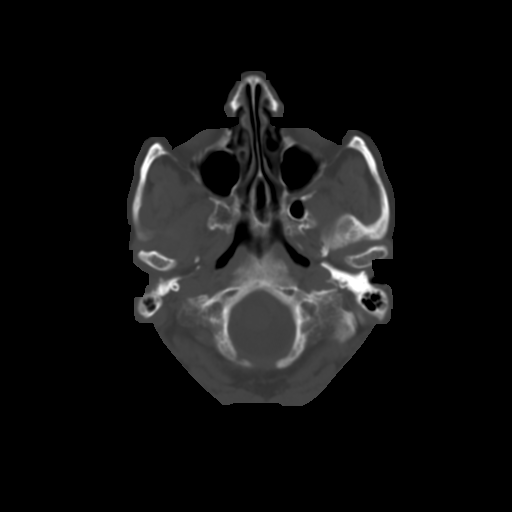
[im 5/32  brain]
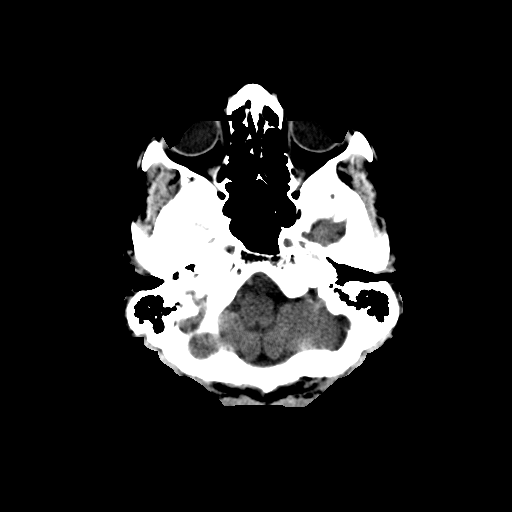
[im 7/32  brain]
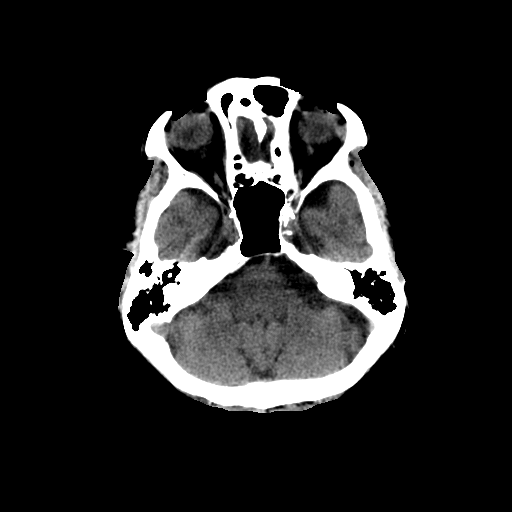
[im 9/32  brain]
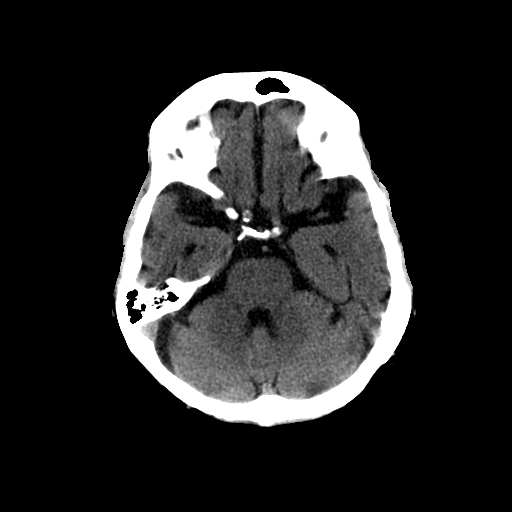
[im 12/32  brain]
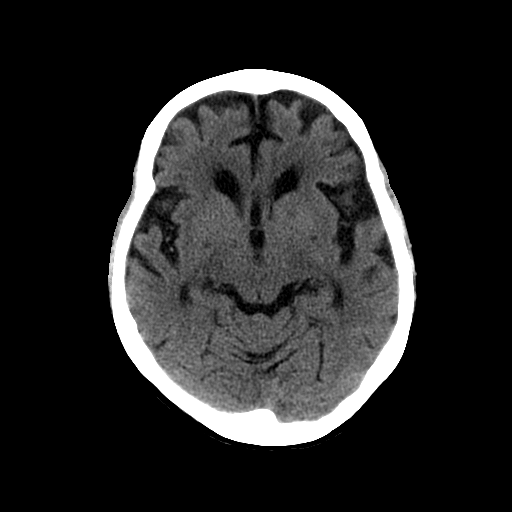
[im 12/32  bone]
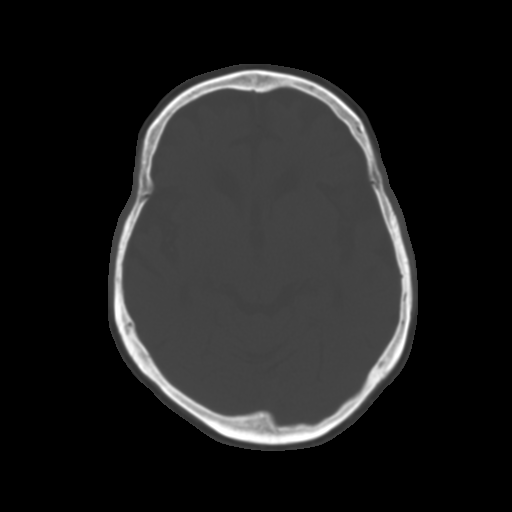
[im 14/32  brain]
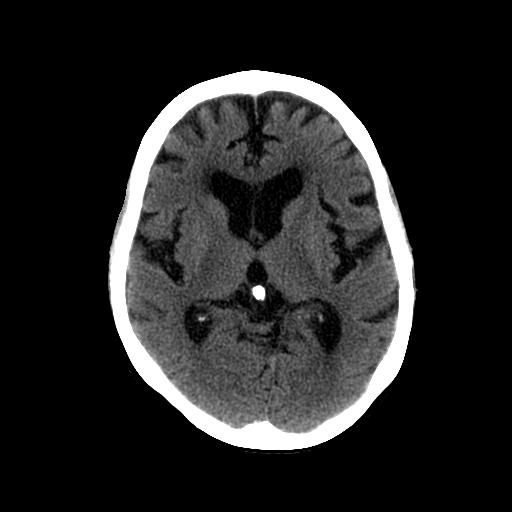
[im 16/32  brain]
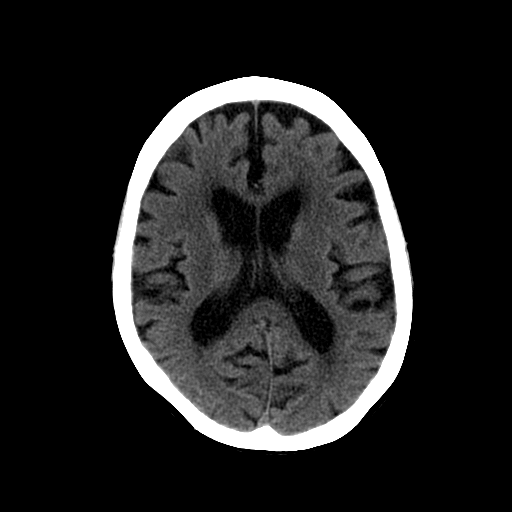
[im 18/32  brain]
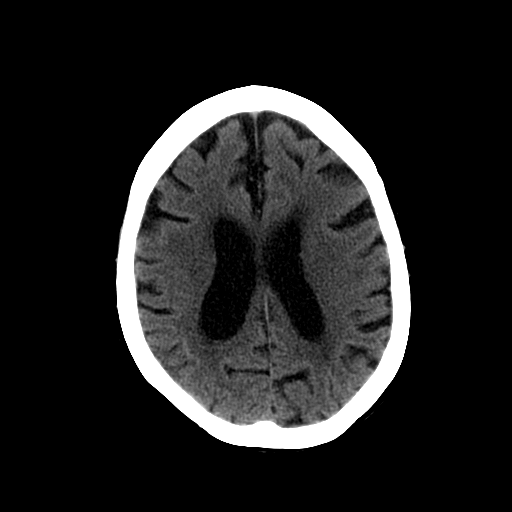
[im 20/32  brain]
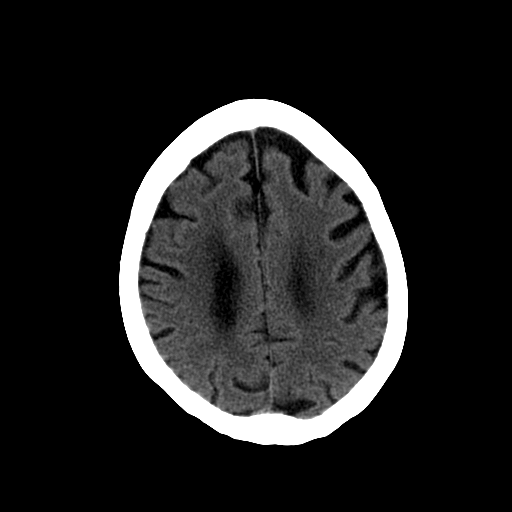
[im 20/32  bone]
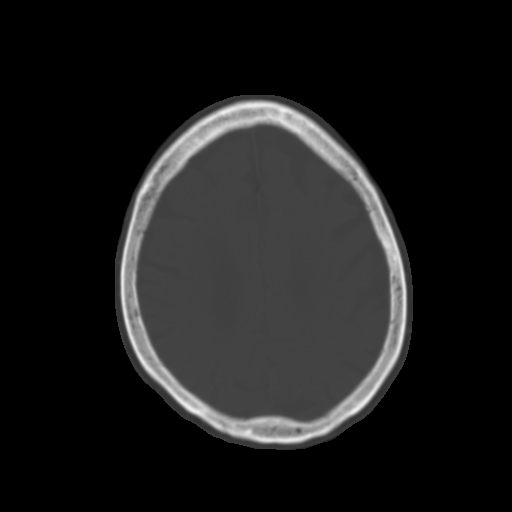
[im 23/32  brain]
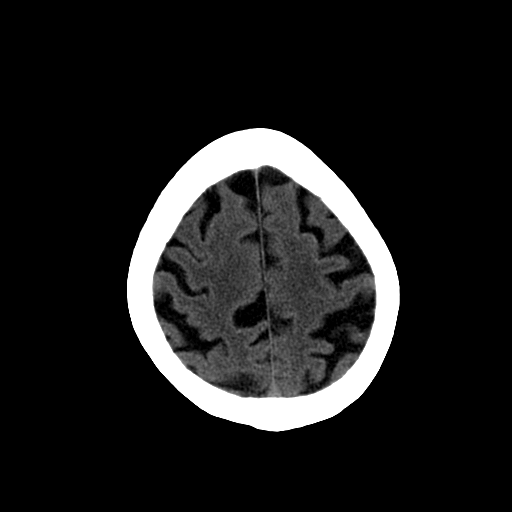
[im 25/32  brain]
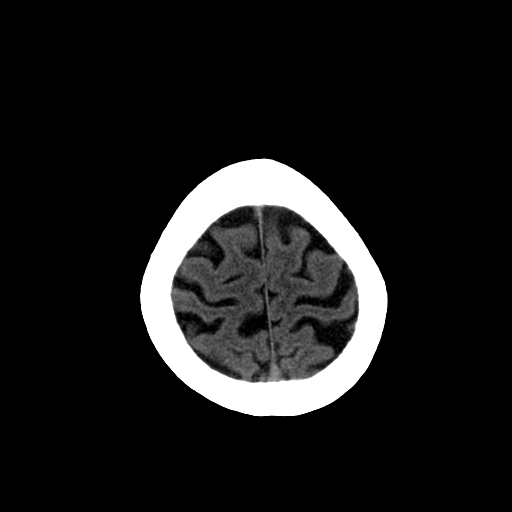
[im 27/32  brain]
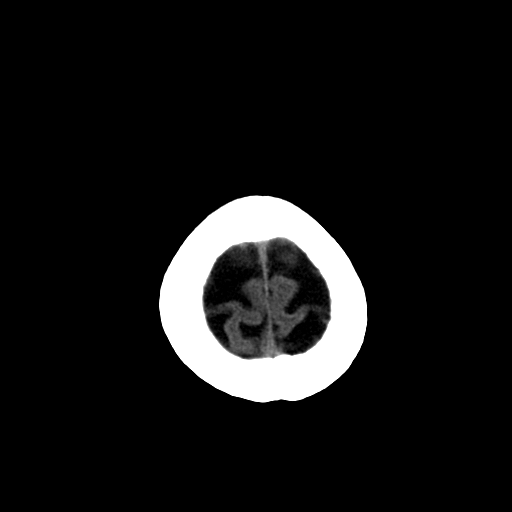
[im 29/32  brain]
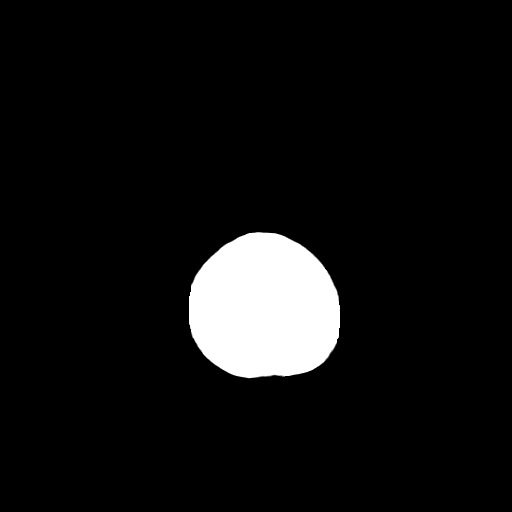
[im 29/32  bone]
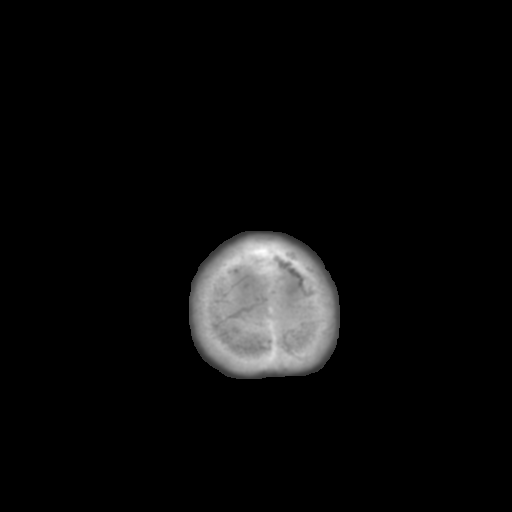

[13 of 30 positions shown; findings below may reference images not displayed]

FINDINGS: No acute intracranial hemorrhage.  No focal mass lesion.
No CT evidence of acute infarction.   No midline shift or mass
effect.  No hydrocephalus.  Basilar cisterns are patent.
Visualized cortical atrophy similar to prior.  There is mild
periventricular subcortical white matter hypodensities.  There are
lacunar infarction within the lentiform nuclei unchanged from
prior. Paranasal sinuses and mastoid air cells are clear.  Orbits
are normal.
IMPRESSION: 1..  No acute intracranial findings.
2.  Atrophy and  chronic microvascular disease.

## 2013-08-01 ENCOUNTER — Ambulatory Visit (INDEPENDENT_AMBULATORY_CARE_PROVIDER_SITE_OTHER): Payer: Medicare Other | Admitting: Nurse Practitioner

## 2013-08-01 ENCOUNTER — Encounter: Payer: Self-pay | Admitting: Nurse Practitioner

## 2013-08-01 VITALS — BP 126/74 | HR 65 | Temp 98.1°F | Ht 65.5 in | Wt 132.0 lb

## 2013-08-01 DIAGNOSIS — I69959 Hemiplegia and hemiparesis following unspecified cerebrovascular disease affecting unspecified side: Secondary | ICD-10-CM | POA: Diagnosis not present

## 2013-08-01 DIAGNOSIS — I639 Cerebral infarction, unspecified: Secondary | ICD-10-CM

## 2013-08-01 DIAGNOSIS — I6350 Cerebral infarction due to unspecified occlusion or stenosis of unspecified cerebral artery: Secondary | ICD-10-CM

## 2013-08-01 DIAGNOSIS — F0390 Unspecified dementia without behavioral disturbance: Secondary | ICD-10-CM | POA: Diagnosis not present

## 2013-08-01 MED ORDER — ASPIRIN-DIPYRIDAMOLE ER 25-200 MG PO CP12: 1.0000 | ORAL_CAPSULE | Freq: Two times a day (BID) | ORAL | Status: DC

## 2013-08-01 NOTE — Patient Instructions (Signed)
Plan: Continue Aggrenox for secondary stroke prevention, monitor risk factors with B/P less than 130 systolic, and continue walking daily. Continue gabapentin for parathesias Fall risk 9, be careful with ambulating, pick feet up when walking.  Follow up in 6 months with Dr. Pearlean BrownieSethi.

## 2013-08-01 NOTE — Progress Notes (Addendum)
Marland Kitchen    PATIENT: Darlene Wilson DOB: 1926-06-08  REASON FOR VISIT: follow up HISTORY FROM: patient  HISTORY OF PRESENT ILLNESS: Darlene Wilson, 80 year old white female returns today for followup. She was last seen in this office 10/12/11 by Dr. Pearlean Brownie. She has a history of TIA and is on Aggrenox. She has a history of 3 TIAs in the past. She went to the emergency room on Oct 14, 2009 for some weakness, numbness in her lip and tingling in the feet, it was characterized as generalized weakness. Her lab values were normal. MRI of the brain was found to be negative for any acute event. Mild to moderate small vessel disease changes. Vascular risk factors include hypertension and history of TIA's in addition to a history of hypothyroidism  Reports an episode of dizziness where she felt like the room was spinning around that lasted approximately 2-3 hours, she was seen at [facility] ER on 08/12/11 and was diagnosed with vertigo. She was prescribed Antivert but has not used. She did take Dramamine the first day with relief. CT scan of head showed no acute finding and atrophy and chronic microvascular ischemic change. Cholesterol levels were checked by her PCP and report they were normal, I do not have the results today. Denies any falls, ambulates with cane. Denies any new neurologic symptoms. Has right foot numbness since her stroke. Taking gabapentin Continues to exercise routinely. Blood pressure today is 120/60. Reports a fullness in her head, will take Tylenol with relief.  05/09/12: Returns for followup with daughter. No further stroke or TIA symptoms. Taking aggrenox without significant bruising. Parathesias not completely controlled with low dose Gabapentin. Continues to exercise several times weekly. No new complaints   UPDATE 01/30/13 (LL):  Patient returns for follow up visit, no new physical complaints. Paresthesias under control. Continues twice daily Aggrenox for stroke prevention, denies side  effects or significant bruising. States that she is having some short term memory problems. Daughter that accompanies her shakes her head in agreement when her mother isn't looking.  UPDATE 08/01/13 (LL):  Patient returns for follow up visit, no new physical complaints.  She was started on Donepizil by Dr. Rogelia Rohrer, taking 10 mg daily and tolerating it well.  Continues twice daily Aggrenox for stroke prevention, denies side effects or significant bruising.  Continues to exercise several times weekly, BP is well controlled, it is 126/74 in office today.   REVIEW OF SYSTEMS: Full 14 system review of systems performed and notable only for: No complaints.   ALLERGIES: Allergies  Allergen Reactions  . Ciprofloxacin Hives    Given with 2 other meds/unsure if truly allergic  . Combigan [Brimonidine Tartrate-Timolol] Other (See Comments)    Inflamation  . Detrol [Tolterodine Tartrate] Hives    Given with 2 other medications/unsure if truly allergic  . Oxycodone Hives    Given with 2 other medications/unsure if truly allergic  . Penicillins Hives, Itching and Rash  . Sulfa Antibiotics Hives, Itching and Rash    HOME MEDICATIONS: Outpatient Prescriptions Prior to Visit  Medication Sig Dispense Refill  . acetaminophen (TYLENOL) 325 MG tablet Take 2 tablets (650 mg total) by mouth every 6 (six) hours as needed.      . Cholecalciferol (VITAMIN D-3 PO) Take 1 tablet by mouth daily.      . Cyanocobalamin (B-12 PO) Take 1 tablet by mouth daily.       Marland Kitchen donepezil (ARICEPT) 10 MG tablet Take 1 tablet (10 mg total) by mouth  at bedtime as needed.  90 tablet  1  . gabapentin (NEURONTIN) 100 MG capsule Take 1 capsule (100 mg total) by mouth at bedtime.  90 capsule  3  . latanoprost (XALATAN) 0.005 % ophthalmic solution Place 1 drop into both eyes at bedtime.        Marland Kitchen. levothyroxine (SYNTHROID, LEVOTHROID) 75 MCG tablet Take 1 tablet (75 mcg total) by mouth daily.  90 tablet  1  . lisinopril  (PRINIVIL,ZESTRIL) 2.5 MG tablet TAKE 1 TABLET BY MOUTH EVERY DAY  90 tablet  1  . ondansetron (ZOFRAN) 4 MG tablet Take 1 tablet (4 mg total) by mouth every 8 (eight) hours as needed for nausea.  90 tablet  1  . promethazine (PHENERGAN) 12.5 MG tablet TAKE 1 TABLET BY MOUTH EVERY 8 HOURS AS NEEDED FOR NAUSEA  20 tablet  0  . timolol (BETIMOL) 0.5 % ophthalmic solution Place 1 drop into both eyes every morning.        . dipyridamole-aspirin (AGGRENOX) 200-25 MG per 12 hr capsule Take 1 capsule by mouth 2 (two) times daily.  180 capsule  0   No facility-administered medications prior to visit.     PHYSICAL EXAM  Filed Vitals:   08/01/13 1010  BP: 126/74  Pulse: 65  Temp: 98.1 F (36.7 C)  TempSrc: Oral  Height: 5' 5.5" (1.664 m)  Weight: 132 lb (59.875 kg)   Body mass index is 21.62 kg/(m^2).  Generalized: In no acute distress, pleasant Caucasian female  Neck: Supple, no carotid bruits  Cardiac: Regular rate rhythm, no murmur Skin: Few petechiae   Neurologic Exam  Mental Status: Awake and fully alert. Follows 1,2 step commands. Mood and affect appropriate. No aphasia, apraxia or dysarthia.  MMSE 19/30 (22/30 last visit) WITH DEFICITS IN ORIENTATION TO TIME AND PLACE; ATTENTION AND CALCULATION, DELAYED RECALL 2/3. CLOCK DRAWING 2/4. 1/5 WORD ASSOCIATIONS. COULD NOT RECALL PREVIOUS PRESIDENT (answered Reagan) OR PRESIDENT DURING WATERGATE. ANIMAL FLUENCY SCORE 8.  CANNOT COPY INTERSECTING POLYGONS.  GDS score 2 does not suggest depression.  Cranial Nerves: Pupils equal, briskly reactive to light. Extraocular movements full without nystagmus. Visual fields full to confrontation. Hearing intact and symmetric to finger snap. Facial sensation intact. Face, tongue, palate move normally and symmetrically.  Motor: Normal bulk and tone. Normal strength in all tested extremity muscles.No drift.  Sensory: Intact to touch and temperature, pinprick and vibratory in all extremities.    Coordination: Rapid alternating movements normal in all extremities. Finger-to-nose and heel-to-shin performed accurately bilaterally.  Gait and Station: Arises from chair without difficulty. Stance is wide based. Able to heel, toe, and slight difficulty with tandem. Romberg negative. Uses cane.  Reflexes: 1+ and symmetric. Toes downgoing.   ASSESSMENT AND PLAN 80 year old female with a history of multiple TIA's most recent Oct 14, 2009. Vascular risk factors inclued hypertension and previous TIA's. Headaches and paresthesias have improved. Progressive 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  Plan: Continue Aggrenox for secondary stroke prevention, monitor risk factors with B/P less than 130 systolic, and continue walking daily. Continue gabapentin for parathesias Fall risk 9, be careful with ambulating, pick feet up when walking. Continue Aricept, consider adding Namenda in the future. Follow up in 6 months with Dr. Pearlean BrownieSethi.  Meds ordered this encounter  Medications  . dipyridamole-aspirin (AGGRENOX) 200-25 MG per 12 hr capsule    Sig: Take 1 capsule by mouth 2 (two) times daily.    Dispense:  180 capsule    Refill:  3  Order Specific Question:  Supervising Provider    Answer:  Suanne Marker [[id number]]   Return in about 6 months (around 02/01/2014).  Ronal Fear, MSN, NP-C 08/01/2013, 12:13 PM [facility] [address], [address] [place], [place] [address] [phone]  Note: This document was prepared with digital dictation and possible smart phrase technology. Any transcriptional errors that result from this process are unintentional.  I reviewed the above note and documentation by the Nurse Practitioner and agree with the history, physical exam, assessment and plan as outlined above.

## 2013-08-16 ENCOUNTER — Other Ambulatory Visit: Payer: Self-pay | Admitting: Family Medicine

## 2013-09-30 ENCOUNTER — Other Ambulatory Visit: Payer: Self-pay | Admitting: Family Medicine

## 2013-10-03 ENCOUNTER — Encounter: Payer: Self-pay | Admitting: Family Medicine

## 2013-10-03 ENCOUNTER — Ambulatory Visit (INDEPENDENT_AMBULATORY_CARE_PROVIDER_SITE_OTHER): Payer: Medicare Other | Admitting: Family Medicine

## 2013-10-03 VITALS — BP 130/70 | HR 71 | Temp 97.8°F | Ht 63.0 in | Wt 134.0 lb

## 2013-10-03 DIAGNOSIS — R7309 Other abnormal glucose: Secondary | ICD-10-CM | POA: Diagnosis not present

## 2013-10-03 DIAGNOSIS — E079 Disorder of thyroid, unspecified: Secondary | ICD-10-CM

## 2013-10-03 DIAGNOSIS — I6350 Cerebral infarction due to unspecified occlusion or stenosis of unspecified cerebral artery: Secondary | ICD-10-CM

## 2013-10-03 DIAGNOSIS — I10 Essential (primary) hypertension: Secondary | ICD-10-CM

## 2013-10-03 DIAGNOSIS — B351 Tinea unguium: Secondary | ICD-10-CM | POA: Diagnosis not present

## 2013-10-03 DIAGNOSIS — R739 Hyperglycemia, unspecified: Secondary | ICD-10-CM

## 2013-10-03 LAB — RENAL FUNCTION PANEL
ALBUMIN: 4 g/dL (ref 3.5–5.2)
BUN: 16 mg/dL (ref 6–23)
CO2: 28 meq/L (ref 19–32)
CREATININE: 0.83 mg/dL (ref 0.50–1.10)
Calcium: 9.3 mg/dL (ref 8.4–10.5)
Chloride: 103 mEq/L (ref 96–112)
GLUCOSE: 98 mg/dL (ref 70–99)
Phosphorus: 3.3 mg/dL (ref 2.3–4.6)
Potassium: 4.5 mEq/L (ref 3.5–5.3)
SODIUM: 138 meq/L (ref 135–145)

## 2013-10-03 LAB — HEMOGLOBIN A1C
Hgb A1c MFr Bld: 5.8 % — ABNORMAL HIGH (ref <5.7)
Mean Plasma Glucose: 120 mg/dL — ABNORMAL HIGH (ref <117)

## 2013-10-03 NOTE — Progress Notes (Signed)
Pre visit review using our clinic review tool, if applicable. No additional management support is needed unless otherwise documented below in the visit note. 

## 2013-10-03 NOTE — Patient Instructions (Signed)
Wants to sign to have daughter on list of people we can speak with.   Soak feet in warm water and peroxide for roughly 15 minutes nightly then apply small amount of cotton between nail and bed   Basic Carbohydrate Counting Basic carbohydrate counting is a way to plan meals. It is done by counting the amount of carbohydrate in foods. Foods that have carbohydrates are starches (grains, beans, starchy vegetables) and sweets. Eating carbohydrates increases blood glucose (sugar) levels. People with diabetes use carbohydrate counting to help keep their blood glucose at a normal level.  COUNTING CARBOHYDRATES IN FOODS The first step in counting carbohydrates is to learn how many carbohydrate servings you should have in every meal. A dietitian can plan this for you. After learning the amount of carbohydrates to include in your meal plan, you can start to choose the carbohydrate-containing foods you want to eat.  There are 2 ways to identify the amount of carbohydrates in the foods you eat.  Read the Nutrition Facts panel on food labels. You need 2 pieces of information from the Nutrition Facts panel to count carbohydrates this way:  Serving size.  Total carbohydrate (in grams). Decide how many servings you will be eating. If it is 1 serving, you will be eating the amount of carbohydrate listed on the panel. If you will be eating 2 servings, you will be eating double the amount of carbohydrate listed on the panel.   Learn serving sizes. A serving size of most carbohydrate-containing foods is about 15 grams (g). Listed below are single serving sizes of common carbohydrate-containing foods:  1 slice bread.   cup unsweetened, dry cereal.   cup hot cereal.   cup rice.   cup mashed potatoes.   cup pasta.  1 cup fresh fruit.   cup canned fruit.  1 cup milk (whole, 2%, or skim).   cup starchy vegetables (peas, corn, or potatoes). Counting carbohydrates this way is similar to looking on the  Nutrition Facts panel. Decide how many servings you will eat first. Multiply the number of servings you eat by 15 g. For example, if you have 2 cups of strawberries, you had 2 servings. That means you had 30 g of carbohydrate (2 servings x 15 g = 30 g). CALCULATING CARBOHYDRATES IN A MEAL Sample dinner  3 oz chicken breast.   cup brown rice.   cup corn.  1 cup fat-free milk.  1 cup strawberries with sugar-free whipped topping. Carbohydrate calculation First, identify the foods that contain carbohydrate:  Rice.  Corn.  Milk.  Strawberries. Calculate the number of servings eaten:  2 servings rice.  1 serving corn.  1 serving milk.  1 serving strawberries. Multiply the number of servings by 15 g:  2 servings rice x 15 g = 30 g.  1 serving corn x 15 g = 15 g.  1 serving milk x 15 g = 15 g.  1 serving strawberries x 15 g = 15 g. Add the amounts to find the total carbohydrates eaten: 30 g + 15 g + 15 g + 15 g = 75 g carbohydrate eaten at dinner. Document Released: 05/16/2005 Document Revised: 08/08/2011 Document Reviewed: 04/01/2011 [facility] Patient Information 2014 [place], [place].

## 2013-10-06 ENCOUNTER — Encounter: Payer: Self-pay | Admitting: Family Medicine

## 2013-10-06 DIAGNOSIS — B351 Tinea unguium: Secondary | ICD-10-CM | POA: Insufficient documentation

## 2013-10-06 DIAGNOSIS — R739 Hyperglycemia, unspecified: Secondary | ICD-10-CM | POA: Insufficient documentation

## 2013-10-06 NOTE — Assessment & Plan Note (Signed)
On Levothyroxine, continue to monitor 

## 2013-10-06 NOTE — Assessment & Plan Note (Signed)
,   minimize simple carbs. Increase exercise as tolerated.

## 2013-10-06 NOTE — Progress Notes (Signed)
Patient ID: Darlene Wilson Hidalgo, female   DOB: 1926-12-18, 80 y.o.   MRN: [id number] Darlene Wilson Morin [id number] 1926-12-18 10/06/2013      Progress Note-Follow Up  Subjective  Chief Complaint  Chief Complaint  Patient presents with  . Follow-up    3 month    HPI  Patient is a 80 year old female in today for routine medical care. No recent illness. Denies any new or neurologic concerns. Denies CP/palp/SOB/HA/congestion/fevers/GI or GU c/o. Taking meds as prescribed   Past Medical History  Diagnosis Date  . History of chicken pox   . Glaucoma     both eyes  . Hypertension   . Chronic kidney disease 1998    stones  . Stroke 04/2007    x 3 total  . Thyroid disease     hypothyroid  . Fibrocystic breast 01/12/2011  . Insomnia 01/13/2011  . Urinary frequency 02/09/2011  . Vertigo 02/09/2011  . Spinal stenosis 06/14/2011  . Nausea 05/31/2012  . Memory loss 10/12/2012    Past Surgical History  Procedure Laterality Date  . Abdominal hysterectomy  1963  . Back surgery  1989    discectomy L4-5 1989, good results  . Breast surgery  1970    biopsy, benign, Wilson/l    Family History  Problem Relation Age of Onset  . Heart disease Mother   . Hypertension Mother   . Diabetes Mother   . Heart disease Father   . Hypertension Father   . Cancer Sister     breast cancer    History   Social History  . Marital Status: Widowed    Spouse Name: N/A    Number of Children: 2  . Years of Education: HS   Occupational History  . Retired    Social History Main Topics  . Smoking status: Never Smoker   . Smokeless tobacco: Never Used  . Alcohol Use: 1.8 oz/week    3 Glasses of wine per week  . Drug Use: No  . Sexual Activity: Not Currently   Other Topics Concern  . Not on file   Social History Narrative   Patient lives at home alone.   Caffeine Use: 1-2 cups  daily    Current Outpatient Prescriptions on File Prior to Visit  Medication Sig Dispense Refill  . acetaminophen (TYLENOL)  325 MG tablet Take 2 tablets (650 mg total) by mouth every 6 (six) hours as needed.      . Cholecalciferol (VITAMIN D-3 PO) Take 1 tablet by mouth daily.      . Cyanocobalamin (Wilson-12 PO) Take 1 tablet by mouth daily.       Marland Kitchen. dipyridamole-aspirin (AGGRENOX) 200-25 MG per 12 hr capsule Take 1 capsule by mouth 2 (two) times daily.  180 capsule  3  . donepezil (ARICEPT) 10 MG tablet TAKE 1 TABLET (10 MG TOTAL) BY MOUTH AT BEDTIME AS NEEDED.  90 tablet  1  . gabapentin (NEURONTIN) 100 MG capsule Take 1 capsule (100 mg total) by mouth at bedtime.  90 capsule  3  . latanoprost (XALATAN) 0.005 % ophthalmic solution Place 1 drop into both eyes at bedtime.        Marland Kitchen. levothyroxine (SYNTHROID, LEVOTHROID) 75 MCG tablet Take 1 tablet (75 mcg total) by mouth daily.  90 tablet  1  . lisinopril (PRINIVIL,ZESTRIL) 2.5 MG tablet TAKE 1 TABLET BY MOUTH EVERY DAY  90 tablet  1  . ondansetron (ZOFRAN) 4 MG tablet TAKE 1 TABLET (4 MG TOTAL)  BY MOUTH EVERY 8 (EIGHT) HOURS AS NEEDED FOR NAUSEA.  90 tablet  1  . promethazine (PHENERGAN) 12.5 MG tablet TAKE 1 TABLET BY MOUTH EVERY 8 HOURS AS NEEDED FOR NAUSEA  20 tablet  0  . timolol (BETIMOL) 0.5 % ophthalmic solution Place 1 drop into both eyes every morning.         No current facility-administered medications on file prior to visit.    Allergies  Allergen Reactions  . Ciprofloxacin Hives    Given with 2 other meds/unsure if truly allergic  . Combigan [Brimonidine Tartrate-Timolol] Other (See Comments)    Inflamation  . Detrol [Tolterodine Tartrate] Hives    Given with 2 other medications/unsure if truly allergic  . Oxycodone Hives    Given with 2 other medications/unsure if truly allergic  . Penicillins Hives, Itching and Rash  . Sulfa Antibiotics Hives, Itching and Rash    Review of Systems  Review of Systems  Constitutional: Positive for malaise/fatigue. Negative for fever.  HENT: Negative for congestion.   Eyes: Negative for discharge.  Respiratory:  Negative for shortness of breath.   Cardiovascular: Negative for chest pain, palpitations and leg swelling.  Gastrointestinal: Negative for nausea, abdominal pain and diarrhea.  Genitourinary: Negative for dysuria.  Musculoskeletal: Positive for back pain. Negative for falls.  Skin: Negative for rash.  Neurological: Negative for loss of consciousness and headaches.  Endo/Heme/Allergies: Negative for polydipsia.  Psychiatric/Behavioral: Negative for depression and suicidal ideas. The patient is not nervous/anxious and does not have insomnia.     Objective  BP 130/70  Pulse 71  Temp(Src) 97.8 F (36.6 C) (Oral)  Ht 5\' 3"  (1.6 m)  Wt 134 lb 0.6 oz (60.8 kg)  BMI 23.75 kg/m2  SpO2 95%  Physical Exam  Physical Exam  Constitutional: She is oriented to person, place, and time and well-developed, well-nourished, and in no distress. No distress.  HENT:  Head: Normocephalic and atraumatic.  Eyes: Conjunctivae are normal.  Neck: Neck supple. No thyromegaly present.  Cardiovascular: Normal rate, regular rhythm and normal heart sounds.   Pulmonary/Chest: Effort normal and breath sounds normal. She has no wheezes.  Abdominal: She exhibits no distension and no mass.  Musculoskeletal: She exhibits no edema.  Lymphadenopathy:    She has no cervical adenopathy.  Neurological: She is alert and oriented to person, place, and time. Coordination normal.  Skin: Skin is warm and dry. No rash noted. She is not diaphoretic.  Thick, yellowish toenails  Psychiatric: Memory, affect and judgment normal.    Lab Results  Component Value Date   TSH 2.092 03/20/2013   Lab Results  Component Value Date   WBC 5.0 03/20/2013   HGB 14.5 03/20/2013   HCT 43.0 03/20/2013   MCV 93.1 03/20/2013   PLT 274 03/20/2013   Lab Results  Component Value Date   CREATININE 0.83 10/03/2013   BUN 16 10/03/2013   NA 138 10/03/2013   K 4.5 10/03/2013   CL 103 10/03/2013   CO2 28 10/03/2013   Lab Results  Component Value  Date   ALT 12 03/20/2013   AST 16 03/20/2013   ALKPHOS 97 03/20/2013   BILITOT 0.5 03/20/2013   Lab Results  Component Value Date   CHOL 142 03/20/2013   Lab Results  Component Value Date   HDL 40 03/20/2013   Lab Results  Component Value Date   LDLCALC 81 03/20/2013   Lab Results  Component Value Date   TRIG 106 03/20/2013   Lab  Results  Component Value Date   CHOLHDL 3.6 03/20/2013     Assessment & Plan  Hypertension Well controlled, no changes to meds. Encouraged heart healthy diet such as the DASH diet and exercise as tolerated.   Onychomycosis Referred to podiatry for ongoing care  Hyperglycemia , minimize simple carbs. Increase exercise as tolerated.  Thyroid disease On Levothyroxine, continue to monitor

## 2013-10-06 NOTE — Assessment & Plan Note (Signed)
Well controlled, no changes to meds. Encouraged heart healthy diet such as the DASH diet and exercise as tolerated.  °

## 2013-10-06 NOTE — Assessment & Plan Note (Signed)
Referred to podiatry for ongoing care

## 2013-10-08 ENCOUNTER — Other Ambulatory Visit: Payer: Self-pay | Admitting: Family Medicine

## 2013-10-11 DIAGNOSIS — M79609 Pain in unspecified limb: Secondary | ICD-10-CM | POA: Diagnosis not present

## 2013-10-11 DIAGNOSIS — L03039 Cellulitis of unspecified toe: Secondary | ICD-10-CM | POA: Diagnosis not present

## 2013-12-04 DIAGNOSIS — H4011X0 Primary open-angle glaucoma, stage unspecified: Secondary | ICD-10-CM | POA: Diagnosis not present

## 2013-12-04 DIAGNOSIS — H409 Unspecified glaucoma: Secondary | ICD-10-CM | POA: Diagnosis not present

## 2013-12-06 ENCOUNTER — Other Ambulatory Visit: Payer: Self-pay | Admitting: Family Medicine

## 2013-12-31 DIAGNOSIS — H409 Unspecified glaucoma: Secondary | ICD-10-CM | POA: Diagnosis not present

## 2013-12-31 DIAGNOSIS — H4011X0 Primary open-angle glaucoma, stage unspecified: Secondary | ICD-10-CM | POA: Diagnosis not present

## 2014-01-08 ENCOUNTER — Other Ambulatory Visit: Payer: Self-pay

## 2014-01-08 MED ORDER — LISINOPRIL 2.5 MG PO TABS: 2.5000 mg | ORAL_TABLET | Freq: Every day | ORAL | Status: DC

## 2014-01-23 ENCOUNTER — Other Ambulatory Visit: Payer: Self-pay | Admitting: Family Medicine

## 2014-01-29 DIAGNOSIS — H4011X0 Primary open-angle glaucoma, stage unspecified: Secondary | ICD-10-CM | POA: Diagnosis not present

## 2014-01-29 DIAGNOSIS — H409 Unspecified glaucoma: Secondary | ICD-10-CM | POA: Diagnosis not present

## 2014-02-04 ENCOUNTER — Ambulatory Visit: Payer: Medicare Other | Admitting: Neurology

## 2014-02-11 ENCOUNTER — Encounter: Payer: Self-pay | Admitting: Neurology

## 2014-02-11 ENCOUNTER — Ambulatory Visit (INDEPENDENT_AMBULATORY_CARE_PROVIDER_SITE_OTHER): Payer: Medicare Other | Admitting: Neurology

## 2014-02-11 VITALS — BP 136/71 | HR 68 | Wt 134.8 lb

## 2014-02-11 DIAGNOSIS — I6350 Cerebral infarction due to unspecified occlusion or stenosis of unspecified cerebral artery: Secondary | ICD-10-CM

## 2014-02-11 DIAGNOSIS — G459 Transient cerebral ischemic attack, unspecified: Secondary | ICD-10-CM

## 2014-02-11 NOTE — Patient Instructions (Signed)
I had a long discussion with the patient and her daughter regarding her history of TIAs and mild dementia and answered questions. Continue Aggrenox for secondary stroke prevention with strict control of hypertension with blood pressure goal below 130/90. Check followup carotid ultrasound study. Continue Aricept for mild dementia. Return for followup in a year or call earlier if necessary.

## 2014-02-11 NOTE — Progress Notes (Signed)
Marland Kitchen    PATIENT: Darlene Wilson DOB: 07-02-1926  REASON FOR VISIT: follow up HISTORY FROM: patient  HISTORY OF PRESENT ILLNESS: Ms. Grisanti, 80 year old white female returns today for followup. She was last seen in this office 10/12/11 by Dr. Pearlean Brownie. She has a history of TIA and is on Aggrenox. She has a history of 3 TIAs in the past. She went to the emergency room on Oct 14, 2009 for some weakness, numbness in her lip and tingling in the feet, it was characterized as generalized weakness. Her lab values were normal. MRI of the brain was found to be negative for any acute event. Mild to moderate small vessel disease changes. Vascular risk factors include hypertension and history of TIA's in addition to a history of hypothyroidism  Reports an episode of dizziness where she felt like the room was spinning around that lasted approximately 2-3 hours, she was seen at [facility] ER on 08/12/11 and was diagnosed with vertigo. She was prescribed Antivert but has not used. She did take Dramamine the first day with relief. CT scan of head showed no acute finding and atrophy and chronic microvascular ischemic change. Cholesterol levels were checked by her PCP and report they were normal, I do not have the results today. Denies any falls, ambulates with cane. Denies any new neurologic symptoms. Has right foot numbness since her stroke. Taking gabapentin Continues to exercise routinely. Blood pressure today is 120/60. Reports a fullness in her head, will take Tylenol with relief.  05/09/12: Returns for followup with daughter. No further stroke or TIA symptoms. Taking aggrenox without significant bruising. Parathesias not completely controlled with low dose Gabapentin. Continues to exercise several times weekly. No new complaints   UPDATE 01/30/13 (LL):  Patient returns for follow up visit, no new physical complaints. Paresthesias under control. Continues twice daily Aggrenox for stroke prevention, denies side  effects or significant bruising. States that she is having some short term memory problems. Daughter that accompanies her shakes her head in agreement when her mother isn't looking.  UPDATE 08/01/13 (LL):  Patient returns for follow up visit, no new physical complaints.  She was started on Donepizil by Dr. Rogelia Rohrer, taking 10 mg daily and tolerating it well.  Continues twice daily Aggrenox for stroke prevention, denies side effects or significant bruising.  Continues to exercise several times weekly, BP is well controlled, it is 126/74 in office today.  UPDATE 02/11/14 : (PS) : She returns for followup after last visit 6 months ago. She is accompanied by her daughter. Patient remains independent in activities of daily living except the daughter and was her finances and ranges from medications. She does occasionally get confused about taking her medicines. There have been no safety concerns, falls, agitation, but hallucinations delusions or behavioral concerns. She has not had a recurrent stroke or TIAs. She is tolerating Aggrenox well without side effects. Her blood pressure is usually well controlled though today it is slightly elevated at 136/71. She had last lipid profile checked 6 months ago by primary physician and was excellent. Patient was started on Aricept following the last visit and she is tolerating it well without side effects. Her Mini-Mental status exam testing score today is  22/30 today which is improved from 19/30 from last visit.  REVIEW OF SYSTEMS: Full 14 system review of systems performed and notable only for: Memory loss, headache, walking difficulty and all other systems negative  ALLERGIES: Allergies  Allergen Reactions  . Ciprofloxacin Hives  Given with 2 other meds/unsure if truly allergic  . Combigan [Brimonidine Tartrate-Timolol] Other (See Comments)    Inflamation  . Detrol [Tolterodine Tartrate] Hives    Given with 2 other medications/unsure if truly allergic  .  Oxycodone Hives    Given with 2 other medications/unsure if truly allergic  . Penicillins Hives, Itching and Rash  . Sulfa Antibiotics Hives, Itching and Rash    HOME MEDICATIONS: Outpatient Prescriptions Prior to Visit  Medication Sig Dispense Refill  . acetaminophen (TYLENOL) 325 MG tablet Take 2 tablets (650 mg total) by mouth every 6 (six) hours as needed.      . Cholecalciferol (VITAMIN D-3 PO) Take 1 tablet by mouth daily.      . Cyanocobalamin (B-12 PO) Take 1 tablet by mouth daily.       Marland Kitchen dipyridamole-aspirin (AGGRENOX) 200-25 MG per 12 hr capsule Take 1 capsule by mouth 2 (two) times daily.  180 capsule  3  . donepezil (ARICEPT) 10 MG tablet TAKE 1 TABLET (10 MG TOTAL) BY MOUTH AT BEDTIME AS NEEDED.  90 tablet  1  . gabapentin (NEURONTIN) 100 MG capsule Take 1 capsule (100 mg total) by mouth at bedtime.  90 capsule  3  . latanoprost (XALATAN) 0.005 % ophthalmic solution Place 1 drop into both eyes at bedtime.        Marland Kitchen levothyroxine (SYNTHROID, LEVOTHROID) 75 MCG tablet TAKE 1 TABLET BY MOUTH EVERY DAY  90 tablet  1  . lisinopril (PRINIVIL,ZESTRIL) 2.5 MG tablet TAKE 1 TABLET BY MOUTH EVERY DAY  90 tablet  0  . ondansetron (ZOFRAN) 4 MG tablet TAKE 1 TABLET EVERY 8 HOURS AS NEEDED FOR NAUSEA  90 tablet  1  . promethazine (PHENERGAN) 12.5 MG tablet TAKE 1 TABLET BY MOUTH EVERY 8 HOURS AS NEEDED FOR NAUSEA  20 tablet  0  . timolol (BETIMOL) 0.5 % ophthalmic solution Place 1 drop into both eyes every morning.         No facility-administered medications prior to visit.     PHYSICAL EXAM  Filed Vitals:   02/11/14 1544  BP: 136/71  Pulse: 68  Weight: 134 lb 12.8 oz (61.145 kg)   Body mass index is 23.88 kg/(m^2).  Generalized: In no acute distress, pleasant Caucasian female  Neck: Supple, no carotid bruits  Cardiac: Regular rate rhythm, no murmur Skin: Few petechiae   Neurologic Exam  Mental Status: Awake and fully alert. Follows 1,2 step commands. Mood and affect  appropriate. No aphasia, apraxia or dysarthia.  MMSE 22/30 (22/30 last visit) WITH DEFICITS IN ORIENTATION TO TIME AND PLACE; ATTENTION AND CALCULATION, DELAYED RECALL 2/3. CLOCK DRAWING 2/4.  Marland Kitchen ANIMAL FLUENCY SCORE 6.  CANNOT COPY INTERSECTING POLYGONS.  GDS score 1 does not suggest depression.  Cranial Nerves: Pupils equal, briskly reactive to light. Extraocular movements full without nystagmus. Visual fields full to confrontation. Hearing intact and symmetric to finger snap. Facial sensation intact. Face, tongue, palate move normally and symmetrically.  Motor: Normal bulk and tone. Normal strength in all tested extremity muscles.No drift.  Sensory: Intact to touch and temperature, pinprick and vibratory in all extremities.  Coordination: Rapid alternating movements normal in all extremities. Finger-to-nose and heel-to-shin performed accurately bilaterally.  Gait and Station: Arises from chair without difficulty. Stance is wide based.uses a cane to walk. Unable to heel, toe, and slight difficulty with tandem. Romberg negative.    Reflexes: 1+ and symmetric. Toes downgoing.   ASSESSMENT AND PLAN 80 year old female with a  history of multiple TIA's most recent Oct 14, 2009. Vascular risk factors inclued hypertension and previous TIA's. Headaches and paresthesias have improved. Progressive 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  Plan:    I had a long discussion with the patient and her daughter regarding her history of TIAs and mild dementia and answered questions. Continue Aggrenox for secondary stroke prevention with strict control of hypertension with blood pressure goal below 130/90. Check followup carotid ultrasound study. Continue Aricept for mild dementia. Return for followup in a year or call earlier if necessary.  No orders of the defined types were placed in this encounter.   Return in about 1 year (around 02/12/2015).  Delia Heady, MD  02/11/2014, 4:30 PM [facility] [address], [address] [place], [place] [address] [phone]  Note: This document was prepared with digital dictation and possible smart phrase technology. Any transcriptional errors that result from this process are unintentional.  I reviewed the above note and documentation by the Nurse Practitioner and agree with the history, physical exam, assessment and plan as outlined above.

## 2014-02-19 ENCOUNTER — Ambulatory Visit (INDEPENDENT_AMBULATORY_CARE_PROVIDER_SITE_OTHER): Payer: Medicare Other

## 2014-02-19 DIAGNOSIS — G459 Transient cerebral ischemic attack, unspecified: Secondary | ICD-10-CM | POA: Diagnosis not present

## 2014-03-24 ENCOUNTER — Telehealth: Payer: Self-pay | Admitting: Neurology

## 2014-03-24 DIAGNOSIS — I639 Cerebral infarction, unspecified: Secondary | ICD-10-CM

## 2014-03-24 MED ORDER — ASPIRIN-DIPYRIDAMOLE ER 25-200 MG PO CP12: 1.0000 | ORAL_CAPSULE | Freq: Two times a day (BID) | ORAL | Status: DC

## 2014-03-24 NOTE — Telephone Encounter (Signed)
Rx has been resent noting generic is permissible.  I called the patient back.  They are aware.

## 2014-03-24 NOTE — Telephone Encounter (Signed)
I called back.  They would like to know if it is okay for the patient to start taking generic Aggrenox instead of Brand Name.  Says they are having issues getting the Brand Name, and also co-pay should be less expensive.  Please advise.  Thank you.

## 2014-03-24 NOTE — Telephone Encounter (Signed)
Generic is fine with me 

## 2014-03-24 NOTE — Telephone Encounter (Signed)
Patient's daughter in law [name] calling to speak with someone regarding switching patient from brand name Aggrenox to generic, please return call and advise.

## 2014-03-25 ENCOUNTER — Other Ambulatory Visit: Payer: Self-pay | Admitting: Family Medicine

## 2014-04-02 DIAGNOSIS — H4011X3 Primary open-angle glaucoma, severe stage: Secondary | ICD-10-CM | POA: Diagnosis not present

## 2014-04-02 DIAGNOSIS — H4011X2 Primary open-angle glaucoma, moderate stage: Secondary | ICD-10-CM | POA: Diagnosis not present

## 2014-04-07 ENCOUNTER — Ambulatory Visit (INDEPENDENT_AMBULATORY_CARE_PROVIDER_SITE_OTHER): Payer: Medicare Other | Admitting: Family Medicine

## 2014-04-07 ENCOUNTER — Ambulatory Visit (INDEPENDENT_AMBULATORY_CARE_PROVIDER_SITE_OTHER): Payer: Medicare Other

## 2014-04-07 ENCOUNTER — Encounter: Payer: Self-pay | Admitting: Family Medicine

## 2014-04-07 VITALS — BP 142/78 | HR 69 | Temp 97.9°F | Ht 63.0 in | Wt 135.2 lb

## 2014-04-07 DIAGNOSIS — Z0000 Encounter for general adult medical examination without abnormal findings: Secondary | ICD-10-CM

## 2014-04-07 DIAGNOSIS — Z23 Encounter for immunization: Secondary | ICD-10-CM | POA: Diagnosis not present

## 2014-04-07 DIAGNOSIS — I10 Essential (primary) hypertension: Secondary | ICD-10-CM | POA: Diagnosis not present

## 2014-04-07 DIAGNOSIS — E039 Hypothyroidism, unspecified: Secondary | ICD-10-CM

## 2014-04-07 DIAGNOSIS — R739 Hyperglycemia, unspecified: Secondary | ICD-10-CM

## 2014-04-07 MED ORDER — LISINOPRIL 2.5 MG PO TABS: 2.5000 mg | ORAL_TABLET | Freq: Two times a day (BID) | ORAL | Status: DC

## 2014-04-07 NOTE — Progress Notes (Signed)
Pre visit review using our clinic review tool, if applicable. No additional management support is needed unless otherwise documented below in the visit note. 

## 2014-04-08 LAB — RENAL FUNCTION PANEL
ALBUMIN: 3.4 g/dL — AB (ref 3.5–5.2)
BUN: 17 mg/dL (ref 6–23)
CALCIUM: 9.4 mg/dL (ref 8.4–10.5)
CO2: 23 meq/L (ref 19–32)
CREATININE: 0.9 mg/dL (ref 0.4–1.2)
Chloride: 103 mEq/L (ref 96–112)
GFR: 62.41 mL/min (ref >60.00)
Glucose, Bld: 100 mg/dL — ABNORMAL HIGH (ref 70–99)
Phosphorus: 3.2 mg/dL (ref 2.3–4.6)
Potassium: 4.5 mEq/L (ref 3.5–5.1)
Sodium: 140 mEq/L (ref 135–145)

## 2014-04-08 LAB — HEPATIC FUNCTION PANEL
ALBUMIN: 3.4 g/dL — AB (ref 3.5–5.2)
ALT: 12 U/L (ref 0–35)
AST: 19 U/L (ref 0–37)
Alkaline Phosphatase: 108 U/L (ref 39–117)
Bilirubin, Direct: 0.1 mg/dL (ref 0.0–0.3)
TOTAL PROTEIN: 6.7 g/dL (ref 6.0–8.3)
Total Bilirubin: 0.4 mg/dL (ref 0.2–1.2)

## 2014-04-08 LAB — CBC
HEMATOCRIT: 44.5 % (ref 36.0–46.0)
HEMOGLOBIN: 14.1 g/dL (ref 12.0–15.0)
MCHC: 31.8 g/dL (ref 30.0–36.0)
MCV: 99.4 fl (ref 78.0–100.0)
PLATELETS: 222 10*3/uL (ref 150.0–400.0)
RBC: 4.47 Mil/uL (ref 3.87–5.11)
RDW: 13.4 % (ref 11.5–15.5)
WBC: 8.1 10*3/uL (ref 4.0–10.5)

## 2014-04-08 LAB — TSH: TSH: 3.01 u[IU]/mL (ref 0.35–4.50)

## 2014-04-11 ENCOUNTER — Telehealth: Payer: Self-pay | Admitting: *Deleted

## 2014-04-11 NOTE — Telephone Encounter (Addendum)
I called and notified the patient's son, Chanetta MarshallJimmy, that I had accidentally mailed out a copy of Mrs. Clavijo's results to another patient due to it being stapled together by a previous employee.  I apologized for my mistake and he was very understanding.  I notified him of Mrs. Kepple's US Carotid Duplex Results per Vella RedheadN. Carolyn Martin, MSN-GNP, [facility] and told him that I would mail her the results immediately.

## 2014-04-13 ENCOUNTER — Encounter: Payer: Self-pay | Admitting: Family Medicine

## 2014-04-13 DIAGNOSIS — Z0000 Encounter for general adult medical examination without abnormal findings: Secondary | ICD-10-CM | POA: Insufficient documentation

## 2014-04-13 NOTE — Assessment & Plan Note (Signed)
Patient denies any difficulties at home. No trouble with ADLs, depression or falls. No recent changes to vision or hearing. Is UTD with immunizations. Is UTD with screening. Discussed Advanced Directives, patient agrees to bring us copies of documents if can. Encouraged heart healthy diet, exercise as tolerated and adequate sleep. Follows with Dr Hazle Quantigby of Opthamology Dr Pearlean BrownieSethi of neurology Declines future colonoscopy, MGM or pap for purely screening purposes. Last MGM 2014 Last colonoscopy 2002

## 2014-04-13 NOTE — Assessment & Plan Note (Signed)
hgba1c acceptable, minimize simple carbs. Increase exercise as tolerated.  

## 2014-04-13 NOTE — Assessment & Plan Note (Signed)
Improved on recheck. Well controlled, no changes to meds. Encouraged heart healthy diet such as the DASH diet and exercise as tolerated.  

## 2014-04-13 NOTE — Progress Notes (Signed)
Frederik SchmidtJanie B Traynham  [id number] 13-Aug-1926 04/13/2014      Progress Note-Follow Up  Subjective  Chief Complaint  Chief Complaint  Patient presents with  . Annual Exam    medicare wellness  . Injections    prevnar and flu    HPI  Patient is a 80 y.o. female in today for routine medical care. She is doing well. NO recent illness. No trips to hospital. Maintains a decent appetite and tries to eat a heart healthy diet. Denies CP/palp/SOB/HA/congestion/fevers/GI or GU c/o. Taking meds as prescribed. She does well at home with self care and preparing her meals.  Past Medical History  Diagnosis Date  . History of chicken pox   . Glaucoma     both eyes  . Hypertension   . Chronic kidney disease 1998    stones  . Stroke 04/2007    x 3 total  . Thyroid disease     hypothyroid  . Fibrocystic breast 01/12/2011  . Insomnia 01/13/2011  . Urinary frequency 02/09/2011  . Vertigo 02/09/2011  . Spinal stenosis 06/14/2011  . Nausea 05/31/2012  . Memory loss 10/12/2012    Past Surgical History  Procedure Laterality Date  . Abdominal hysterectomy  1963  . Back surgery  1989    discectomy L4-5 1989, good results  . Breast surgery  1970    biopsy, benign, b/l    Family History  Problem Relation Age of Onset  . Heart disease Mother   . Hypertension Mother   . Diabetes Mother   . Heart disease Father   . Hypertension Father   . Cancer Sister     breast cancer  . Mental illness Brother     service connected    History   Social History  . Marital Status: Widowed    Spouse Name: N/A    Number of Children: 2  . Years of Education: HS   Occupational History  . Retired    Social History Main Topics  . Smoking status: Never Smoker   . Smokeless tobacco: Never Used  . Alcohol Use: 0.6 oz/week    1 Glasses of wine per week     Comment: daily  . Drug Use: No  . Sexual Activity: Not Currently     Comment: lives alone, no dietary restrictions   Other Topics Concern  . Not on  file   Social History Narrative   Patient lives at home alone.   Caffeine Use: 1-2 cups  daily    Current Outpatient Prescriptions on File Prior to Visit  Medication Sig Dispense Refill  . acetaminophen (TYLENOL) 325 MG tablet Take 2 tablets (650 mg total) by mouth every 6 (six) hours as needed.    . dipyridamole-aspirin (AGGRENOX) 200-25 MG per 12 hr capsule Take 1 capsule by mouth 2 (two) times daily. 180 capsule 3  . donepezil (ARICEPT) 10 MG tablet TAKE 1 TABLET (10 MG TOTAL) BY MOUTH AT BEDTIME AS NEEDED. 90 tablet 1  . gabapentin (NEURONTIN) 100 MG capsule Take 1 capsule (100 mg total) by mouth at bedtime. 90 capsule 3  . latanoprost (XALATAN) 0.005 % ophthalmic solution Place 1 drop into both eyes at bedtime.      Marland Kitchen. levothyroxine (SYNTHROID, LEVOTHROID) 75 MCG tablet TAKE 1 TABLET BY MOUTH EVERY DAY 90 tablet 1  . ondansetron (ZOFRAN) 4 MG tablet TAKE 1 TABLET EVERY 8 HOURS AS NEEDED FOR NAUSEA 90 tablet 1  . timolol (BETIMOL) 0.5 % ophthalmic solution Place 1 drop  into both eyes every morning.       No current facility-administered medications on file prior to visit.    Allergies  Allergen Reactions  . Ciprofloxacin Hives    Given with 2 other meds/unsure if truly allergic  . Combigan [Brimonidine Tartrate-Timolol] Other (See Comments)    Inflamation  . Detrol [Tolterodine Tartrate] Hives    Given with 2 other medications/unsure if truly allergic  . Oxycodone Hives    Given with 2 other medications/unsure if truly allergic  . Penicillins Hives, Itching and Rash  . Sulfa Antibiotics Hives, Itching and Rash    Review of Systems  ROS  Objective  BP 142/78 mmHg  Pulse 69  Temp(Src) 97.9 F (36.6 C) (Oral)  Ht 5\' 3"  (1.6 m)  Wt 135 lb 3.2 oz (61.326 kg)  BMI 23.96 kg/m2  SpO2 97%  Physical Exam  Physical Exam  Lab Results  Component Value Date   TSH 3.01 04/07/2014   Lab Results  Component Value Date   WBC 8.1 04/07/2014   HGB 14.1 04/07/2014   HCT  44.5 04/07/2014   MCV 99.4 04/07/2014   PLT 222.0 04/07/2014   Lab Results  Component Value Date   CREATININE 0.9 04/07/2014   BUN 17 04/07/2014   NA 140 04/07/2014   K 4.5 04/07/2014   CL 103 04/07/2014   CO2 23 04/07/2014   Lab Results  Component Value Date   ALT 12 04/07/2014   AST 19 04/07/2014   ALKPHOS 108 04/07/2014   BILITOT 0.4 04/07/2014   Lab Results  Component Value Date   CHOL 142 03/20/2013   Lab Results  Component Value Date   HDL 40 03/20/2013   Lab Results  Component Value Date   LDLCALC 81 03/20/2013   Lab Results  Component Value Date   TRIG 106 03/20/2013   Lab Results  Component Value Date   CHOLHDL 3.6 03/20/2013     Assessment & Plan  Hypertension Improved on recheck. Well controlled, no changes to meds. Encouraged heart healthy diet such as the DASH diet and exercise as tolerated.   Hyperglycemia hgba1c acceptable, minimize simple carbs. Increase exercise as tolerated.   Medicare annual wellness visit, subsequent Patient denies any difficulties at home. No trouble with ADLs, depression or falls. No recent changes to vision or hearing. Is UTD with immunizations. Is UTD with screening. Discussed Advanced Directives, patient agrees to bring us copies of documents if can. Encouraged heart healthy diet, exercise as tolerated and adequate sleep. Follows with Dr Hazle Quantigby of Opthamology Dr Pearlean BrownieSethi of neurology Declines future colonoscopy, MGM or pap for purely screening purposes. Last MGM 2014 Last colonoscopy 2002

## 2014-05-02 ENCOUNTER — Other Ambulatory Visit: Payer: Self-pay

## 2014-05-02 DIAGNOSIS — I639 Cerebral infarction, unspecified: Secondary | ICD-10-CM

## 2014-05-02 DIAGNOSIS — R519 Headache, unspecified: Secondary | ICD-10-CM

## 2014-05-02 DIAGNOSIS — R51 Headache: Principal | ICD-10-CM

## 2014-05-02 MED ORDER — LISINOPRIL 2.5 MG PO TABS: 2.5000 mg | ORAL_TABLET | Freq: Two times a day (BID) | ORAL | Status: DC

## 2014-05-02 MED ORDER — LATANOPROST 0.005 % OP SOLN: 1.0000 [drp] | Freq: Every day | OPHTHALMIC | Status: DC

## 2014-05-02 MED ORDER — GABAPENTIN 100 MG PO CAPS: 100.0000 mg | ORAL_CAPSULE | Freq: Every day | ORAL | Status: DC

## 2014-05-02 MED ORDER — DONEPEZIL HCL 10 MG PO TABS: 10.0000 mg | ORAL_TABLET | Freq: Every day | ORAL | Status: DC

## 2014-05-02 MED ORDER — ASPIRIN-DIPYRIDAMOLE ER 25-200 MG PO CP12: 1.0000 | ORAL_CAPSULE | Freq: Two times a day (BID) | ORAL | Status: DC

## 2014-06-25 ENCOUNTER — Other Ambulatory Visit: Payer: Self-pay | Admitting: Family Medicine

## 2014-06-25 MED ORDER — LEVOTHYROXINE SODIUM 75 MCG PO TABS: 75.0000 ug | ORAL_TABLET | Freq: Every day | ORAL | Status: DC

## 2014-07-01 DIAGNOSIS — H524 Presbyopia: Secondary | ICD-10-CM | POA: Diagnosis not present

## 2014-07-01 DIAGNOSIS — H4011X2 Primary open-angle glaucoma, moderate stage: Secondary | ICD-10-CM | POA: Diagnosis not present

## 2014-07-01 DIAGNOSIS — H4011X3 Primary open-angle glaucoma, severe stage: Secondary | ICD-10-CM | POA: Diagnosis not present

## 2014-07-08 ENCOUNTER — Other Ambulatory Visit: Payer: Self-pay | Admitting: Family Medicine

## 2014-07-08 MED ORDER — ONDANSETRON HCL 4 MG PO TABS: ORAL_TABLET | ORAL | Status: DC

## 2014-07-31 ENCOUNTER — Other Ambulatory Visit: Payer: Self-pay | Admitting: Family Medicine

## 2014-08-21 ENCOUNTER — Other Ambulatory Visit: Payer: Self-pay | Admitting: Family Medicine

## 2014-08-21 NOTE — Telephone Encounter (Signed)
Med filled.  

## 2014-09-08 ENCOUNTER — Encounter: Payer: Self-pay | Admitting: Family Medicine

## 2014-09-08 ENCOUNTER — Ambulatory Visit (INDEPENDENT_AMBULATORY_CARE_PROVIDER_SITE_OTHER): Payer: Medicare Other | Admitting: Family Medicine

## 2014-09-08 VITALS — BP 142/75 | HR 83 | Temp 98.7°F | Ht 65.0 in | Wt 133.5 lb

## 2014-09-08 DIAGNOSIS — H9190 Unspecified hearing loss, unspecified ear: Secondary | ICD-10-CM | POA: Diagnosis not present

## 2014-09-08 DIAGNOSIS — I10 Essential (primary) hypertension: Secondary | ICD-10-CM | POA: Diagnosis not present

## 2014-09-08 DIAGNOSIS — F0390 Unspecified dementia without behavioral disturbance: Secondary | ICD-10-CM

## 2014-09-08 DIAGNOSIS — R739 Hyperglycemia, unspecified: Secondary | ICD-10-CM

## 2014-09-08 DIAGNOSIS — E079 Disorder of thyroid, unspecified: Secondary | ICD-10-CM | POA: Diagnosis not present

## 2014-09-08 DIAGNOSIS — F418 Other specified anxiety disorders: Secondary | ICD-10-CM

## 2014-09-08 MED ORDER — CITALOPRAM HYDROBROMIDE 10 MG PO TABS: 10.0000 mg | ORAL_TABLET | Freq: Every day | ORAL | Status: DC

## 2014-09-08 MED ORDER — DONEPEZIL HCL 10 MG PO TABS: 10.0000 mg | ORAL_TABLET | Freq: Every day | ORAL | Status: DC

## 2014-09-08 MED ORDER — ASPIRIN-DIPYRIDAMOLE ER 25-200 MG PO CP12: ORAL_CAPSULE | ORAL | Status: DC

## 2014-09-08 MED ORDER — LISINOPRIL 2.5 MG PO TABS: ORAL_TABLET | ORAL | Status: DC

## 2014-09-08 MED ORDER — LISINOPRIL 5 MG PO TABS: 5.0000 mg | ORAL_TABLET | Freq: Two times a day (BID) | ORAL | Status: DC

## 2014-09-08 NOTE — Patient Instructions (Signed)

## 2014-09-09 LAB — COMPREHENSIVE METABOLIC PANEL
ALBUMIN: 4 g/dL (ref 3.5–5.2)
ALT: 14 U/L (ref 0–35)
AST: 20 U/L (ref 0–37)
Alkaline Phosphatase: 114 U/L (ref 39–117)
BILIRUBIN TOTAL: 0.3 mg/dL (ref 0.2–1.2)
BUN: 9 mg/dL (ref 6–23)
CO2: 31 mEq/L (ref 19–32)
Calcium: 9.5 mg/dL (ref 8.4–10.5)
Chloride: 100 mEq/L (ref 96–112)
Creatinine, Ser: 0.96 mg/dL (ref 0.40–1.20)
GFR: 58.62 mL/min — ABNORMAL LOW (ref >60.00)
Glucose, Bld: 89 mg/dL (ref 70–99)
Potassium: 3.9 mEq/L (ref 3.5–5.1)
SODIUM: 136 meq/L (ref 135–145)
TOTAL PROTEIN: 6.8 g/dL (ref 6.0–8.3)

## 2014-09-09 LAB — CBC
HCT: 42 % (ref 36.0–46.0)
Hemoglobin: 14 g/dL (ref 12.0–15.0)
MCHC: 33.4 g/dL (ref 30.0–36.0)
MCV: 94.9 fl (ref 78.0–100.0)
Platelets: 255 10*3/uL (ref 150.0–400.0)
RBC: 4.43 Mil/uL (ref 3.87–5.11)
RDW: 13.6 % (ref 11.5–15.5)
WBC: 7.3 10*3/uL (ref 4.0–10.5)

## 2014-09-09 LAB — TSH: TSH: 3.15 u[IU]/mL (ref 0.35–4.50)

## 2014-09-14 ENCOUNTER — Encounter: Payer: Self-pay | Admitting: Family Medicine

## 2014-09-14 DIAGNOSIS — F418 Other specified anxiety disorders: Secondary | ICD-10-CM

## 2014-09-14 DIAGNOSIS — H9190 Unspecified hearing loss, unspecified ear: Secondary | ICD-10-CM | POA: Insufficient documentation

## 2014-09-14 HISTORY — DX: Other specified anxiety disorders: F41.8

## 2014-09-14 NOTE — Assessment & Plan Note (Signed)
On Levothyroxine, continue to monitor 

## 2014-09-14 NOTE — Assessment & Plan Note (Signed)
Here today with her daughter who believes she will benefit from SSRI patient less confident but willing to try given rx for Citalopram 10 mg to try

## 2014-09-14 NOTE — Assessment & Plan Note (Signed)
minimize simple carbs. Increase exercise as tolerated.  

## 2014-09-14 NOTE — Assessment & Plan Note (Signed)
Referred to audiology for further testing.

## 2014-09-14 NOTE — Progress Notes (Signed)
Darlene Wilson  [id number] [birth year] 09/14/2014      Progress Note-Follow Up  Subjective  Chief Complaint  Chief Complaint  Patient presents with  . Follow-up    HPI  Patient is a 81 y.o. female in today for routine medical care. Patient in today for follow-up with her daughter in her usual state of health. No recent illness. Denies any acute complaints. They do acknowledge she is struggling with some low-grade anhedonia and some anxiety. She is unsure if medications might be helpful. Memory loss appears stable. She is eating well and doing well at home otherwise. Denies CP/palp/SOB/HA/congestion/fevers/GI or GU c/o. Taking meds as prescribed  Past Medical History  Diagnosis Date  . History of chicken pox   . Glaucoma     both eyes  . Hypertension   . Chronic kidney disease 1998    stones  . Stroke 04/2007    x 3 total  . Thyroid disease     hypothyroid  . Fibrocystic breast 01/12/2011  . Insomnia 01/13/2011  . Urinary frequency 02/09/2011  . Vertigo 02/09/2011  . Spinal stenosis 06/14/2011  . Nausea 05/31/2012  . Memory loss 10/12/2012  . Depression with anxiety 09/14/2014    Past Surgical History  Procedure Laterality Date  . Abdominal hysterectomy  1963  . Back surgery  1989    discectomy L4-5 1989, good results  . Breast surgery  1970    biopsy, benign, b/l    Family History  Problem Relation Age of Onset  . Heart disease Mother   . Hypertension Mother   . Diabetes Mother   . Heart disease Father   . Hypertension Father   . Cancer Sister     breast cancer  . Mental illness Brother     service connected    History   Social History  . Marital Status: Widowed    Spouse Name: N/A  . Number of Children: 2  . Years of Education: HS   Occupational History  . Retired    Social History Main Topics  . Smoking status: Never Smoker   . Smokeless tobacco: Never Used  . Alcohol Use: 0.6 oz/week    1 Glasses of wine per week     Comment: daily  . Drug  Use: No  . Sexual Activity: Not Currently     Comment: lives alone, no dietary restrictions   Other Topics Concern  . Not on file   Social History Narrative   Patient lives at home alone.   Caffeine Use: 1-2 cups  daily    Current Outpatient Prescriptions on File Prior to Visit  Medication Sig Dispense Refill  . acetaminophen (TYLENOL) 325 MG tablet Take 2 tablets (650 mg total) by mouth every 6 (six) hours as needed.    . gabapentin (NEURONTIN) 100 MG capsule TAKE 1 CAPSULE (100 MG TOTAL) BY MOUTH AT BEDTIME. 90 capsule 1  . latanoprost (XALATAN) 0.005 % ophthalmic solution Place 1 drop into both eyes at bedtime. 2.5 mL 0  . levothyroxine (SYNTHROID, LEVOTHROID) 75 MCG tablet Take 1 tablet (75 mcg total) by mouth daily. 90 tablet 1  . timolol (BETIMOL) 0.5 % ophthalmic solution Place 1 drop into both eyes every morning.      . ondansetron (ZOFRAN) 4 MG tablet TAKE 1 TABLET EVERY 8 HOURS AS NEEDED FOR NAUSEA (Patient not taking: Reported on 09/08/2014) 90 tablet 0   No current facility-administered medications on file prior to visit.    Allergies  Allergen  Reactions  . Ciprofloxacin Hives    Given with 2 other meds/unsure if truly allergic  . Combigan [Brimonidine Tartrate-Timolol] Other (See Comments)    Inflamation  . Detrol [Tolterodine Tartrate] Hives    Given with 2 other medications/unsure if truly allergic  . Oxycodone Hives    Given with 2 other medications/unsure if truly allergic  . Penicillins Hives, Itching and Rash  . Sulfa Antibiotics Hives, Itching and Rash    Review of Systems  Review of Systems  Constitutional: Negative for fever and malaise/fatigue.  HENT: Negative for congestion.   Eyes: Negative for discharge.  Respiratory: Negative for shortness of breath.   Cardiovascular: Negative for chest pain, palpitations and leg swelling.  Gastrointestinal: Negative for nausea, abdominal pain and diarrhea.  Genitourinary: Negative for dysuria.    Musculoskeletal: Positive for back pain. Negative for falls.  Skin: Negative for rash.  Neurological: Negative for loss of consciousness and headaches.  Endo/Heme/Allergies: Negative for polydipsia.  Psychiatric/Behavioral: Positive for depression and memory loss. Negative for suicidal ideas. The patient is nervous/anxious. The patient does not have insomnia.     Objective  BP 142/75 mmHg  Pulse 83  Temp(Src) 98.7 F (37.1 C) (Oral)  Ht 5\' 5"  (1.651 m)  Wt 133 lb 8 oz (60.555 kg)  BMI 22.22 kg/m2  SpO2 95%  Physical Exam  Physical Exam  Constitutional: She is oriented to person, place, and time and well-developed, well-nourished, and in no distress. No distress.  HENT:  Head: Normocephalic and atraumatic.  Eyes: Conjunctivae are normal.  Neck: Neck supple. No thyromegaly present.  Cardiovascular: Normal rate, regular rhythm and normal heart sounds.   Pulmonary/Chest: Effort normal and breath sounds normal. She has no wheezes.  Abdominal: She exhibits no distension and no mass.  Musculoskeletal: She exhibits no edema.  Lymphadenopathy:    She has no cervical adenopathy.  Neurological: She is alert and oriented to person, place, and time.  Skin: Skin is warm and dry. No rash noted. She is not diaphoretic.  Psychiatric: Memory, affect and judgment normal.    Lab Results  Component Value Date   TSH 3.15 09/08/2014   Lab Results  Component Value Date   WBC 7.3 09/08/2014   HGB 14.0 09/08/2014   HCT 42.0 09/08/2014   MCV 94.9 09/08/2014   PLT 255.0 09/08/2014   Lab Results  Component Value Date   CREATININE 0.96 09/08/2014   BUN 9 09/08/2014   NA 136 09/08/2014   K 3.9 09/08/2014   CL 100 09/08/2014   CO2 31 09/08/2014   Lab Results  Component Value Date   ALT 14 09/08/2014   AST 20 09/08/2014   ALKPHOS 114 09/08/2014   BILITOT 0.3 09/08/2014   Lab Results  Component Value Date   CHOL 142 03/20/2013   Lab Results  Component Value Date   HDL 40  03/20/2013   Lab Results  Component Value Date   LDLCALC 81 03/20/2013   Lab Results  Component Value Date   TRIG 106 03/20/2013   Lab Results  Component Value Date   CHOLHDL 3.6 03/20/2013     Assessment & Plan  Hypertension Mildly elevated at home as well. Will increase Lisinopril 5 mg to bid Encouraged heart healthy diet such as the DASH diet and exercise as tolerated.    Thyroid disease On Levothyroxine, continue to monitor   Hyperglycemia minimize simple carbs. Increase exercise as tolerated.    Dementia without behavioral disturbance Tolerating Aricept   Hearing loss Referred  to audiology for further testing.    Depression with anxiety Here today with her daughter who believes she will benefit from SSRI patient less confident but willing to try given rx for Citalopram 10 mg to try

## 2014-09-14 NOTE — Assessment & Plan Note (Signed)
Tolerating Aricept

## 2014-09-14 NOTE — Assessment & Plan Note (Addendum)
Mildly elevated at home as well. Will increase Lisinopril 5 mg to bid Encouraged heart healthy diet such as the DASH diet and exercise as tolerated.

## 2014-09-30 ENCOUNTER — Telehealth: Payer: Self-pay | Admitting: Family Medicine

## 2014-09-30 MED ORDER — ASPIRIN-DIPYRIDAMOLE ER 25-200 MG PO CP12: ORAL_CAPSULE | ORAL | Status: DC

## 2014-09-30 NOTE — Telephone Encounter (Signed)
Caller name: Fayrene Fearingjames Relation to pt: son Call back number: [phone] Pharmacy: [facility] [place]  Reason for call:   Patient son states that patient has thrown out aggrenox pills. Requesting another refill

## 2014-09-30 NOTE — Telephone Encounter (Signed)
Sent in to [facility] [place] and patients son informed prescription sent in.

## 2014-10-01 DIAGNOSIS — H4011X3 Primary open-angle glaucoma, severe stage: Secondary | ICD-10-CM | POA: Diagnosis not present

## 2014-10-01 DIAGNOSIS — H04123 Dry eye syndrome of bilateral lacrimal glands: Secondary | ICD-10-CM | POA: Diagnosis not present

## 2014-10-21 ENCOUNTER — Encounter (HOSPITAL_COMMUNITY): Payer: Self-pay

## 2014-10-21 ENCOUNTER — Emergency Department (HOSPITAL_COMMUNITY): Payer: Medicare Other

## 2014-10-21 ENCOUNTER — Observation Stay (HOSPITAL_COMMUNITY)
Admission: EM | Admit: 2014-10-21 | Discharge: 2014-10-22 | Disposition: A | Discharge status: Home / Self Care | Payer: Medicare Other | Attending: Internal Medicine | Admitting: Internal Medicine

## 2014-10-21 DIAGNOSIS — M4800 Spinal stenosis, site unspecified: Secondary | ICD-10-CM | POA: Insufficient documentation

## 2014-10-21 DIAGNOSIS — E079 Disorder of thyroid, unspecified: Secondary | ICD-10-CM | POA: Diagnosis present

## 2014-10-21 DIAGNOSIS — N6019 Diffuse cystic mastopathy of unspecified breast: Secondary | ICD-10-CM | POA: Insufficient documentation

## 2014-10-21 DIAGNOSIS — I129 Hypertensive chronic kidney disease with stage 1 through stage 4 chronic kidney disease, or unspecified chronic kidney disease: Secondary | ICD-10-CM | POA: Insufficient documentation

## 2014-10-21 DIAGNOSIS — G4700 Insomnia, unspecified: Secondary | ICD-10-CM | POA: Diagnosis not present

## 2014-10-21 DIAGNOSIS — R0602 Shortness of breath: Secondary | ICD-10-CM | POA: Diagnosis present

## 2014-10-21 DIAGNOSIS — N189 Chronic kidney disease, unspecified: Secondary | ICD-10-CM | POA: Insufficient documentation

## 2014-10-21 DIAGNOSIS — I10 Essential (primary) hypertension: Secondary | ICD-10-CM | POA: Diagnosis present

## 2014-10-21 DIAGNOSIS — I639 Cerebral infarction, unspecified: Secondary | ICD-10-CM | POA: Diagnosis present

## 2014-10-21 DIAGNOSIS — Z79899 Other long term (current) drug therapy: Secondary | ICD-10-CM | POA: Diagnosis not present

## 2014-10-21 DIAGNOSIS — R0789 Other chest pain: Secondary | ICD-10-CM

## 2014-10-21 DIAGNOSIS — R072 Precordial pain: Principal | ICD-10-CM | POA: Insufficient documentation

## 2014-10-21 DIAGNOSIS — R079 Chest pain, unspecified: Secondary | ICD-10-CM | POA: Diagnosis present

## 2014-10-21 DIAGNOSIS — Z8673 Personal history of transient ischemic attack (TIA), and cerebral infarction without residual deficits: Secondary | ICD-10-CM | POA: Diagnosis not present

## 2014-10-21 DIAGNOSIS — F418 Other specified anxiety disorders: Secondary | ICD-10-CM | POA: Diagnosis not present

## 2014-10-21 DIAGNOSIS — F0390 Unspecified dementia without behavioral disturbance: Secondary | ICD-10-CM | POA: Diagnosis present

## 2014-10-21 DIAGNOSIS — Z8619 Personal history of other infectious and parasitic diseases: Secondary | ICD-10-CM | POA: Diagnosis not present

## 2014-10-21 DIAGNOSIS — H409 Unspecified glaucoma: Secondary | ICD-10-CM | POA: Diagnosis not present

## 2014-10-21 DIAGNOSIS — I2510 Atherosclerotic heart disease of native coronary artery without angina pectoris: Secondary | ICD-10-CM | POA: Diagnosis not present

## 2014-10-21 DIAGNOSIS — I69959 Hemiplegia and hemiparesis following unspecified cerebrovascular disease affecting unspecified side: Secondary | ICD-10-CM

## 2014-10-21 LAB — I-STAT TROPONIN, ED: TROPONIN I, POC: 0 ng/mL (ref 0.00–0.08)

## 2014-10-21 LAB — CBC
HEMATOCRIT: 41.1 % (ref 36.0–46.0)
HEMOGLOBIN: 13.6 g/dL (ref 12.0–15.0)
MCH: 31.1 pg (ref 26.0–34.0)
MCHC: 33.1 g/dL (ref 30.0–36.0)
MCV: 94.1 fL (ref 78.0–100.0)
Platelets: 251 10*3/uL (ref 150–400)
RBC: 4.37 MIL/uL (ref 3.87–5.11)
RDW: 12.6 % (ref 11.5–15.5)
WBC: 7.2 10*3/uL (ref 4.0–10.5)

## 2014-10-21 LAB — BASIC METABOLIC PANEL
Anion gap: 8 (ref 5–15)
BUN: 9 mg/dL (ref 6–20)
CALCIUM: 8.9 mg/dL (ref 8.9–10.3)
CO2: 27 mmol/L (ref 22–32)
Chloride: 96 mmol/L — ABNORMAL LOW (ref 101–111)
Creatinine, Ser: 0.8 mg/dL (ref 0.44–1.00)
GFR calc non Af Amer: >60 mL/min (ref >60)
GLUCOSE: 112 mg/dL — AB (ref 65–99)
POTASSIUM: 4 mmol/L (ref 3.5–5.1)
Sodium: 131 mmol/L — ABNORMAL LOW (ref 135–145)

## 2014-10-21 LAB — TROPONIN I: Troponin I: <0.03 ng/mL (ref <0.031)

## 2014-10-21 MED ORDER — ONDANSETRON HCL 4 MG/2ML IJ SOLN: 4.0000 mg | Freq: Four times a day (QID) | INTRAMUSCULAR | Status: DC | PRN

## 2014-10-21 MED ORDER — ACETAMINOPHEN 325 MG PO TABS
650.0000 mg | ORAL_TABLET | ORAL | Status: DC | PRN
  Administered 2014-10-21: 650 mg via ORAL
  Filled 2014-10-21: qty 2

## 2014-10-21 MED ORDER — IOHEXOL 350 MG/ML SOLN
100.0000 mL | Freq: Once | INTRAVENOUS | Status: AC | PRN
  Administered 2014-10-21: 100 mL via INTRAVENOUS

## 2014-10-21 MED ORDER — GABAPENTIN 100 MG PO CAPS
100.0000 mg | ORAL_CAPSULE | Freq: Every day | ORAL | Status: DC
  Administered 2014-10-21: 100 mg via ORAL
  Filled 2014-10-21 (×2): qty 1

## 2014-10-21 MED ORDER — SODIUM CHLORIDE 0.9 % IV BOLUS (SEPSIS)
1000.0000 mL | Freq: Once | INTRAVENOUS | Status: AC
  Administered 2014-10-21: 1000 mL via INTRAVENOUS

## 2014-10-21 MED ORDER — LEVOTHYROXINE SODIUM 75 MCG PO TABS
75.0000 ug | ORAL_TABLET | Freq: Every day | ORAL | Status: DC
  Filled 2014-10-21 (×2): qty 1

## 2014-10-21 MED ORDER — LATANOPROST 0.005 % OP SOLN
1.0000 [drp] | Freq: Every day | OPHTHALMIC | Status: DC
  Administered 2014-10-21: 1 [drp] via OPHTHALMIC
  Filled 2014-10-21: qty 2.5

## 2014-10-21 MED ORDER — CITALOPRAM HYDROBROMIDE 10 MG PO TABS
10.0000 mg | ORAL_TABLET | Freq: Every day | ORAL | Status: DC
  Administered 2014-10-21 – 2014-10-22 (×2): 10 mg via ORAL
  Filled 2014-10-21 (×2): qty 1

## 2014-10-21 MED ORDER — HYDRALAZINE HCL 20 MG/ML IJ SOLN
5.0000 mg | Freq: Four times a day (QID) | INTRAMUSCULAR | Status: DC | PRN
  Administered 2014-10-22: 5 mg via INTRAVENOUS

## 2014-10-21 MED ORDER — ASPIRIN-DIPYRIDAMOLE ER 25-200 MG PO CP12
1.0000 | ORAL_CAPSULE | Freq: Two times a day (BID) | ORAL | Status: DC
  Administered 2014-10-21 – 2014-10-22 (×2): 1 via ORAL
  Filled 2014-10-21 (×3): qty 1

## 2014-10-21 MED ORDER — ENOXAPARIN SODIUM 40 MG/0.4ML ~~LOC~~ SOLN
40.0000 mg | SUBCUTANEOUS | Status: DC
Start: 2014-10-21 — End: 2014-10-22
  Administered 2014-10-21: 40 mg via SUBCUTANEOUS
  Filled 2014-10-21 (×2): qty 0.4

## 2014-10-21 MED ORDER — LISINOPRIL 5 MG PO TABS
5.0000 mg | ORAL_TABLET | Freq: Two times a day (BID) | ORAL | Status: DC
  Administered 2014-10-21 – 2014-10-22 (×2): 5 mg via ORAL
  Filled 2014-10-21 (×3): qty 1

## 2014-10-21 MED ORDER — MORPHINE SULFATE 2 MG/ML IJ SOLN: 1.0000 mg | INTRAMUSCULAR | Status: DC | PRN

## 2014-10-21 NOTE — ED Notes (Signed)
Patient ordered meal tray.

## 2014-10-21 NOTE — ED Notes (Signed)
Per EMS, Patient complains of substernal Chest pain started about an hour ago that radiates to her neck. Patient complains she feels like she has something sitting on her chest. Patient is complaining of nausea with no vomiting. Patient was given 324 mg of aspirin and 4 mg of zofran. Patient reports no chest pain after given zofran at this time. Vitals per EMS: 182/94, 64 HR, 16 RR, 98% on RA. Patient is coming from home. Pt has HX of Stroke, HTN, and Hypothyroidism.

## 2014-10-21 NOTE — ED Notes (Signed)
Patient ambulated. Saturation stayed between 97% and 100%

## 2014-10-21 NOTE — ED Notes (Signed)
Patient to be transported upstairs by Molly Maduroobert, EMT.

## 2014-10-21 NOTE — ED Provider Notes (Signed)
16:45:  Assumed care from Dr. Rush Landmarkegeler.  Briefly, pt is an 81 yo F presenting with SOB.  Watering garden when she had sudden severe SOB.  Chest pain radiating across chest.  Mother has h/o PE.  VSS.  PE study pending.  EKG unremarkable.  Initial troponin negative.    CTA chest without evidence of PE or other abnormality.  Will admit for ACS rule out.  Pt currently chest pain free.  Stable at time of admission.   Jodean LimaEmily Rainna Nearhood, MD 10/23/14 1234  Richardean Canalavid H Yao, MD 10/25/14 1001

## 2014-10-21 NOTE — ED Notes (Signed)
Patient returned to room due to CTs need for scan. Patient placed back on the monitor.

## 2014-10-21 NOTE — ED Notes (Signed)
[facility] called. Room is ready.

## 2014-10-21 NOTE — ED Notes (Signed)
Attempted Report x1.   

## 2014-10-21 NOTE — ED Notes (Signed)
X-Ray at the bedside 

## 2014-10-21 NOTE — H&P (Signed)
[facility] History and Physical  Darlene Wilson KKX:[id number] DOB: 06-13-1926 DOA: 10/21/2014   PCP: Penni Homans, MD  Specialists: Dr. Kathaleen Grinder is her neurologist  Chief Complaint: Shortness of breath and chest tightness  HPI: Darlene Wilson is a 81 y.o. female with a past medical history of stroke with right-sided deficits, hypertension, vascular dementia, but highly functioning, who lives by herself. Patient is somewhat distracted and does not appear to be a good historian. She was in her usual state of health until earlier today when she was watering plants in her backyard. She had to go somewhat of an uphill to do this. When she was doing this activity she started developing shortness of breath. She felt a tightness in her chest. She felt a little lightheaded. She came back into the house, but the symptoms did not resolve. The pain got up to 8 out of 10 in intensity. It was located in the central chest. There was no radiation. It was associated with nausea. She did not have any sweating. Denies any syncopal episode. She denies any fever, chills or any other kind of illness recently. No cough. No leg swelling. She also tells me that the previous night, that is last night, she had some chest tightness which resolved on its own. At that time she called her son who asked her to go to the hospital, but she declined. Denies any history of acid reflux/heartburn. Today when her symptoms persisted she called her family again. EMS was called. They gave her aspirin and Zofran and brought her into the hospital. She denies a previous history of similar complaints. She does not recall ever having had a stress test. Denies any history of heart disease. Does not have any chest tightness currently. She also mentions that she has a history of fibrocystic disease in her breast and occasionally gets some discomfort in the left breast, but the symptoms that she had this morning was different.  Home  Medications: Prior to Admission medications   Medication Sig Start Date End Date Taking? Authorizing Provider  acetaminophen (TYLENOL) 325 MG tablet Take 2 tablets (650 mg total) by mouth every 6 (six) hours as needed. 11/21/12  Yes Delfina Redwood, MD  citalopram (CELEXA) 10 MG tablet Take 1 tablet (10 mg total) by mouth daily. 09/08/14  Yes Mosie Lukes, MD  dipyridamole-aspirin (AGGRENOX) 200-25 MG per 12 hr capsule TAKE 1 CAPSULE BY MOUTH 2 (TWO) TIMES DAILY. 09/30/14  Yes Mosie Lukes, MD  donepezil (ARICEPT) 10 MG tablet Take 1 tablet (10 mg total) by mouth at bedtime. 09/08/14  Yes Mosie Lukes, MD  gabapentin (NEURONTIN) 100 MG capsule TAKE 1 CAPSULE (100 MG TOTAL) BY MOUTH AT BEDTIME. 07/31/14  Yes Mosie Lukes, MD  latanoprost (XALATAN) 0.005 % ophthalmic solution Place 1 drop into both eyes at bedtime. 05/02/14  Yes Mosie Lukes, MD  levothyroxine (SYNTHROID, LEVOTHROID) 75 MCG tablet Take 1 tablet (75 mcg total) by mouth daily. 06/25/14  Yes Mosie Lukes, MD  lisinopril (PRINIVIL,ZESTRIL) 5 MG tablet Take 1 tablet (5 mg total) by mouth 2 (two) times daily. 09/08/14  Yes Mosie Lukes, MD  timolol (BETIMOL) 0.5 % ophthalmic solution Place 1 drop into both eyes every morning.     Yes Historical Provider, MD  ondansetron (ZOFRAN) 4 MG tablet TAKE 1 TABLET EVERY 8 HOURS AS NEEDED FOR NAUSEA Patient not taking: Reported on 09/08/2014 07/08/14   Mosie Lukes, MD    Allergies:  Allergies  Allergen Reactions  . Ciprofloxacin Hives    Given with 2 other meds/unsure if truly allergic  . Combigan [Brimonidine Tartrate-Timolol] Other (See Comments)    Inflamation  . Detrol [Tolterodine Tartrate] Hives    Given with 2 other medications/unsure if truly allergic  . Oxycodone Hives    Given with 2 other medications/unsure if truly allergic  . Penicillins Hives, Itching and Rash  . Sulfa Antibiotics Hives, Itching and Rash    Past Medical History: Past Medical History  Diagnosis Date   . History of chicken pox   . Glaucoma     both eyes  . Hypertension   . Chronic kidney disease 1998    stones  . Stroke 04/2007    x 3 total  . Thyroid disease     hypothyroid  . Fibrocystic breast 01/12/2011  . Insomnia 01/13/2011  . Urinary frequency 02/09/2011  . Vertigo 02/09/2011  . Spinal stenosis 06/14/2011  . Nausea 05/31/2012  . Memory loss 10/12/2012  . Depression with anxiety 09/14/2014    Past Surgical History  Procedure Laterality Date  . Abdominal hysterectomy  1963  . Back surgery  1989    discectomy L4-5 1989, good results  . Breast surgery  1970    biopsy, benign, b/l    Social History: Patient lives alone. She denies any history of smoking. Drinks 1 glass of red wine every night. She uses a cane to ambulate.  Family History:  Family History  Problem Relation Age of Onset  . Heart disease Mother   . Hypertension Mother   . Diabetes Mother   . Heart disease Father   . Hypertension Father   . Cancer Sister     breast cancer  . Mental illness Brother     service connected     Review of Systems - unable to obtain due to her dementia  Physical Examination  Filed Vitals:   10/21/14 1530 10/21/14 1700 10/21/14 1715 10/21/14 1730  BP: 152/69 152/65 160/71 176/83  Pulse: 67 62 64 69  Temp:      TempSrc:      Resp: '19 23 22 30  ' SpO2: 100% 100% 98% 100%    BP 176/83 mmHg  Pulse 69  Temp(Src) 98.3 F (36.8 C) (Oral)  Resp 30  SpO2 100%  General appearance: alert, cooperative, appears stated age and no distress Head: Normocephalic, without obvious abnormality, atraumatic Eyes: conjunctivae/corneas clear. PERRL, EOM's intact.  Throat: lips, mucosa, and tongue normal; teeth and gums normal Neck: no adenopathy, no carotid bruit, no JVD, supple, symmetrical, trachea midline and thyroid not enlarged, symmetric, no tenderness/mass/nodules Back: symmetric, no curvature. ROM normal. No CVA tenderness. Resp: clear to auscultation bilaterally Cardio:  regular rate and rhythm, S1, S2 , bradycardic, no murmur, click, rub or gallop GI: soft, non-tender; bowel sounds normal; no masses,  no organomegaly Extremities: extremities normal, atraumatic, no cyanosis or edema Pulses: 2+ and symmetric Skin: Skin color, texture, turgor normal. No rashes or lesions Lymph nodes: Cervical, supraclavicular, and axillary nodes normal. Neurologic: Right-sided deficits from previous stroke.  Laboratory Data: Results for orders placed or performed during the hospital encounter of 10/21/14 (from the past 48 hour(s))  CBC     Status: None   Collection Time: 10/21/14  1:30 PM  Result Value Ref Range   WBC 7.2 4.0 - 10.5 K/uL   RBC 4.37 3.87 - 5.11 MIL/uL   Hemoglobin 13.6 12.0 - 15.0 g/dL   HCT 41.1 36.0 - 46.0 %  MCV 94.1 78.0 - 100.0 fL   MCH 31.1 26.0 - 34.0 pg   MCHC 33.1 30.0 - 36.0 g/dL   RDW 12.6 11.5 - 15.5 %   Platelets 251 150 - 400 K/uL  Basic metabolic panel     Status: Abnormal   Collection Time: 10/21/14  1:30 PM  Result Value Ref Range   Sodium 131 (L) 135 - 145 mmol/L   Potassium 4.0 3.5 - 5.1 mmol/L   Chloride 96 (L) 101 - 111 mmol/L   CO2 27 22 - 32 mmol/L   Glucose, Bld 112 (H) 65 - 99 mg/dL   BUN 9 6 - 20 mg/dL   Creatinine, Ser 0.80 0.44 - 1.00 mg/dL   Calcium 8.9 8.9 - 10.3 mg/dL   GFR calc non Af Amer >60 >60 mL/min   GFR calc Af Amer >60 >60 mL/min    Comment: (NOTE) The eGFR has been calculated using the CKD EPI equation. This calculation has not been validated in all clinical situations. eGFR's persistently <60 mL/min signify possible Chronic Kidney Disease.    Anion gap 8 5 - 15  I-stat troponin, ED  (not at [facility], [facility])     Status: None   Collection Time: 10/21/14  1:35 PM  Result Value Ref Range   Troponin i, poc 0.00 0.00 - 0.08 ng/mL   Comment 3            Comment: Due to the release kinetics of cTnI, a negative result within the first hours of the onset of symptoms does not rule out myocardial infarction with  certainty. If myocardial infarction is still suspected, repeat the test at appropriate intervals.     Radiology Reports: Ct Angio Chest Pe W/cm &/or Wo Cm  10/21/2014   CLINICAL DATA:  Substernal chest pain radiates neck.  EXAM: CT ANGIOGRAPHY CHEST WITH CONTRAST  TECHNIQUE: Multidetector CT imaging of the chest was performed using the standard protocol during bolus administration of intravenous contrast. Multiplanar CT image reconstructions and MIPs were obtained to evaluate the vascular anatomy.  CONTRAST:  123m OMNIPAQUE IOHEXOL 350 MG/ML SOLN  COMPARISON:  Chest x-ray earlier today.  FINDINGS: No filling defects in the pulmonary arteries to suggest pulmonary emboli. Heart is normal size. Calcifications throughout the aorta without aneurysm. Scattered calcifications in the left coronary and right coronary arteries. No mediastinal, hilar, or axillary adenopathy.  No confluent airspace opacities.  No pleural effusions.  Chest wall soft tissues are unremarkable. Imaging into the upper abdomen shows no acute findings. No acute bony abnormality.  Review of the MIP images confirms the above findings.  IMPRESSION: No evidence of pulmonary embolus.  Scattered coronary artery calcifications.  No acute findings.   Electronically Signed   By: KRolm BaptiseM.D.   On: 10/21/2014 16:55   Dg Chest Port 1 View  10/21/2014   CLINICAL DATA:  Chest pain and difficulty breathing for 1 day  EXAM: PORTABLE CHEST - 1 VIEW  COMPARISON:  November 20, 2012  FINDINGS: There is no edema or consolidation. There are scattered tiny granulomas bilaterally. Heart size and pulmonary vascularity are normal. No adenopathy. There is atherosclerotic change in aorta.  IMPRESSION: No edema or consolidation.  Scattered tiny granulomas bilaterally.   Electronically Signed   By: WLowella GripIII M.D.   On: 10/21/2014 13:22    Electrocardiogram: Sinus rhythm, 56 bpm. Normal axis. Intervals appear to be normal. No Q waves. No concerning ST  changes. Nonspecific T-wave changes, especially inversion in aVL.  Problem List  Principal Problem:   Chest tightness Active Problems:   Hypertension   Stroke   Thyroid disease   Hemiplegia of dominant side as late effect following cerebrovascular disease   Dementia without behavioral disturbance   Depression with anxiety   SOB (shortness of breath)   Assessment: This is a 81 year old Caucasian female with past negative history as stated earlier, who presents complaints of shortness of breath and chest tightness with exertion. She has multiple risk factors including hypertension, age, previous history of stroke. Even though she is not a very good historian her symptoms are somewhat concerning for cardiac etiology. CT angiogram of the chest was done which was negative for PE. Heart score is at least 5.  Plan: #1 chest pain: She merits observation in the hospital. Troponins will be serially obtained. Continue with her Aggrenox for now. She is symptom free currently. No clear indication to do anticoagulation. However, she warrants inpatient stress test if she rules out for acute coronary syndrome. This has been discussed with Dr. Harrington Challenger and ordered for tomorrow. EKG will be repeated in the morning. Noted to be bradycardic. Hold her Aricept and Timolol eyedrops.  #2 History of essential hypertension: Blood pressure is noted to be on the higher side. She is on antihypertensives at home. Blood pressure be monitored closely. Hydralazine as needed. Continue with her home medications.  #3 history of previous stroke with the right-sided deficits: Stable. Continue with Aggrenox.  #4 history of hypothyroidism: Continue with home medications.  #5 history of dementia, likely vascular, without any behavioral disturbances: Stable. Somewhat anxious predisposition. She is noted to be on Aricept. However, her heart rate is bradycardic. Hold for now.   DVT Prophylaxis: Lovenox Code Status: Full code Family  Communication: Discussed with the key her daughter-in-law  Disposition Plan: Observe to telemetry   Further management decisions will depend on results of further testing and patient's response to treatment.   [facility]  [facility] Pager [phone]  If 7PM-7AM, please contact night-coverage [email or web address] Password [facility]  10/21/2014, 5:52 PM

## 2014-10-21 NOTE — ED Notes (Signed)
MD at the bedside  

## 2014-10-21 NOTE — ED Notes (Signed)
[phone]  [facility] Center[facility]y [place].

## 2014-10-21 NOTE — ED Notes (Signed)
[facility] Stated Room was a MRSA room. Room has been zapped, but they are waiting on Environment to clean.

## 2014-10-21 NOTE — ED Provider Notes (Signed)
CSN: [id number]     Arrival date & time 10/21/14  1209 History   First MD Initiated Contact with Patient 10/21/14 1220     Chief Complaint  Patient presents with  . Chest Pain   Darlene Wilson is a 81 y.o. female with a past medical history significant for strokes on Aggrenox, depression, hypertension, and chronic kidney disease who presents with chest pain and shortness of breath. The patient reports that she was outside tending to her garden when she had sudden onset of shortness of breath several hours ago. The patient says that the pain was extremely significant, radiating across her chest, and associated with shortness of breath. The patient describes the pain as radiating from her chest to her neck. The patient says that she had not been able to catch her breath since that time. The patient reportedly went inside and sat down however, she said this did not help her symptoms. The patient then had EMS called who gave the patient Zofran for her mild nausea and aspirin for her pain. The patient reported improvement in her chest pain but continues to report shortness of breath. The patient denies fevers, chills, cough, abdominal pain, vomiting, constipation, diarrhea, dysuria, rashes. The patient reports that she had a normal breakfast this morning.    (Consider location/radiation/quality/duration/timing/severity/associated sxs/prior Treatment) Patient is a 81 y.o. female presenting with chest pain. The history is provided by the patient and medical records. No language interpreter was used.  Chest Pain Pain location:  Substernal area, L lateral chest and R lateral chest Pain quality: crushing and pressure   Pain radiates to:  Neck Pain radiates to the back: no   Pain severity:  Severe Onset quality:  Sudden Duration:  2 hours Timing:  Constant Progression:  Resolved Chronicity:  New Context: breathing   Relieved by:  Aspirin Worsened by:  Deep breathing Associated symptoms: shortness of  breath   Associated symptoms: no abdominal pain, no anxiety, no back pain, no cough, no diaphoresis, no dizziness, no fatigue, no fever, no nausea, no near-syncope, no numbness, no palpitations, no syncope, not vomiting and no weakness   Shortness of breath:    Severity:  Severe   Onset quality:  Sudden   Duration:  1 hour   Timing:  Constant   Progression:  Unchanged Risk factors: no coronary artery disease, not female, no prior DVT/PE and no smoking     Past Medical History  Diagnosis Date  . History of chicken pox   . Glaucoma     both eyes  . Hypertension   . Chronic kidney disease 1998    stones  . Stroke 04/2007    x 3 total  . Thyroid disease     hypothyroid  . Fibrocystic breast 01/12/2011  . Insomnia 01/13/2011  . Urinary frequency 02/09/2011  . Vertigo 02/09/2011  . Spinal stenosis 06/14/2011  . Nausea 05/31/2012  . Memory loss 10/12/2012  . Depression with anxiety 09/14/2014   Past Surgical History  Procedure Laterality Date  . Abdominal hysterectomy  1963  . Back surgery  1989    discectomy L4-5 1989, good results  . Breast surgery  1970    biopsy, benign, b/l   Family History  Problem Relation Age of Onset  . Heart disease Mother   . Hypertension Mother   . Diabetes Mother   . Heart disease Father   . Hypertension Father   . Cancer Sister     breast cancer  . Mental illness  Brother     service connected   History  Substance Use Topics  . Smoking status: Never Smoker   . Smokeless tobacco: Never Used  . Alcohol Use: 0.6 oz/week    1 Glasses of wine per week     Comment: daily   OB History    No data available     Review of Systems  Constitutional: Negative for fever, chills, diaphoresis, appetite change and fatigue.  HENT: Negative for congestion and rhinorrhea.   Eyes: Negative for photophobia.  Respiratory: Positive for shortness of breath. Negative for cough, choking, chest tightness, wheezing and stridor.   Cardiovascular: Positive for chest  pain. Negative for palpitations, syncope and near-syncope.  Gastrointestinal: Negative for nausea, vomiting, abdominal pain, diarrhea and constipation.  Genitourinary: Negative for dysuria, frequency and flank pain.  Musculoskeletal: Negative for back pain, neck pain and neck stiffness.  Skin: Negative for rash and wound.  Neurological: Negative for dizziness, weakness and numbness.  Psychiatric/Behavioral: Negative for confusion and agitation.  All other systems reviewed and are negative.     Allergies  Ciprofloxacin; Combigan; Detrol; Oxycodone; Penicillins; and Sulfa antibiotics  Home Medications   Prior to Admission medications   Medication Sig Start Date End Date Taking? Authorizing Provider  acetaminophen (TYLENOL) 325 MG tablet Take 2 tablets (650 mg total) by mouth every 6 (six) hours as needed. 11/21/12   Christiane Haorinna L Sullivan, MD  citalopram (CELEXA) 10 MG tablet Take 1 tablet (10 mg total) by mouth daily. 09/08/14   Bradd CanaryStacey A Blyth, MD  dipyridamole-aspirin (AGGRENOX) 200-25 MG per 12 hr capsule TAKE 1 CAPSULE BY MOUTH 2 (TWO) TIMES DAILY. 09/30/14   Bradd CanaryStacey A Blyth, MD  donepezil (ARICEPT) 10 MG tablet Take 1 tablet (10 mg total) by mouth at bedtime. 09/08/14   Bradd CanaryStacey A Blyth, MD  gabapentin (NEURONTIN) 100 MG capsule TAKE 1 CAPSULE (100 MG TOTAL) BY MOUTH AT BEDTIME. 07/31/14   Bradd CanaryStacey A Blyth, MD  latanoprost (XALATAN) 0.005 % ophthalmic solution Place 1 drop into both eyes at bedtime. 05/02/14   Bradd CanaryStacey A Blyth, MD  levothyroxine (SYNTHROID, LEVOTHROID) 75 MCG tablet Take 1 tablet (75 mcg total) by mouth daily. 06/25/14   Bradd CanaryStacey A Blyth, MD  lisinopril (PRINIVIL,ZESTRIL) 5 MG tablet Take 1 tablet (5 mg total) by mouth 2 (two) times daily. 09/08/14   Bradd CanaryStacey A Blyth, MD  ondansetron (ZOFRAN) 4 MG tablet TAKE 1 TABLET EVERY 8 HOURS AS NEEDED FOR NAUSEA Patient not taking: Reported on 09/08/2014 07/08/14   Bradd CanaryStacey A Blyth, MD  timolol (BETIMOL) 0.5 % ophthalmic solution Place 1 drop into both eyes  every morning.      Historical Provider, MD   BP 161/95 mmHg  Pulse 58  Temp(Src) 98.3 F (36.8 C) (Oral)  Resp 14  SpO2 100% Physical Exam  Constitutional: She is oriented to person, place, and time. She appears well-developed and well-nourished. No distress.  HENT:  Head: Normocephalic and atraumatic.  Mouth/Throat: No oropharyngeal exudate.  Eyes: Conjunctivae and EOM are normal. Pupils are equal, round, and reactive to light. No scleral icterus.  Neck: Normal range of motion.  Cardiovascular: Normal rate, regular rhythm, normal heart sounds and intact distal pulses.   No murmur heard. Pulmonary/Chest: Effort normal and breath sounds normal. No stridor. No respiratory distress. She has no wheezes. She exhibits no tenderness.  Abdominal: Soft. There is no tenderness.  Musculoskeletal: She exhibits no tenderness.  Neurological: She is alert and oriented to person, place, and time. She displays normal reflexes.  She exhibits normal muscle tone.  Skin: Skin is warm. She is not diaphoretic. No pallor.  Psychiatric: She has a normal mood and affect.  Nursing note and vitals reviewed.   ED Course  Procedures (including critical care time) Labs Review Labs Reviewed  BASIC METABOLIC PANEL - Abnormal; Notable for the following:    Sodium 131 (*)    Chloride 96 (*)    Glucose, Bld 112 (*)    All other components within normal limits  CBC  I-STAT TROPOININ, ED    Imaging Review Dg Chest Port 1 View  10/21/2014   CLINICAL DATA:  Chest pain and difficulty breathing for 1 day  EXAM: PORTABLE CHEST - 1 VIEW  COMPARISON:  November 20, 2012  FINDINGS: There is no edema or consolidation. There are scattered tiny granulomas bilaterally. Heart size and pulmonary vascularity are normal. No adenopathy. There is atherosclerotic change in aorta.  IMPRESSION: No edema or consolidation.  Scattered tiny granulomas bilaterally.   Electronically Signed   By: Bretta Bang III M.D.   On: 10/21/2014 13:22      EKG Interpretation   Date/Time:  Tuesday Oct 21 2014 12:14:31 EDT Ventricular Rate:  56 PR Interval:  204 QRS Duration: 81 QT Interval:  460 QTC Calculation: 444 R Axis:   11 Text Interpretation:  Sinus rhythm No significant change since last  tracing Confirmed by YAO  MD, DAVID ([id number]) on 10/21/2014 12:32:31 PM      MDM   [name] is a 81 y.o. female with a past medical history significant for strokes on Aggrenox, depression, hypertension, and chronic kidney disease who presents with chest pain and shortness of breath. The patient's vital signs on arrival revealed a normal heart rate, mild tachypnea of 22-27, mild hypertension, but was maintaining her oxygen saturation on room air. Despite the patient's vital signs, there was clinical concern for pulmonary embolism given her family history with her mother having a PE, her chest pain description, and her shortness of breath.   The patient's initial laboratory workup revealed a normal troponin, an unremarkable CBC, and a mild hyponatremia of 131 on her BMP. The patient's creatinine was unremarkable. The patient's chest x-ray showed no edema or consolidation but did reveal tiny granulomas bilaterally. The patient denied fevers or chills and also denied productive cough. The patient reported her chest pain began suddenly this morning.  The patient had a CT angiogram of her chest ordered to evaluate for pulmonary embolism. While awaiting this diagnostic testing, the patient's care was transferred to Dr. Artis Flock. Please see her note for further workup and management this patient in the emergency bar. The plan of care will be to likely admit the patient for shortness of breath and chest pain if her PE study is negative.  The patient's care was transferred in stable condition.    This patient was seen with Dr. Silverio Lay, emergency medicine attending.   Final diagnoses:  None        Theda Belfast, MD 10/21/14 1648  Richardean Canal,  MD 10/25/14 [phone]

## 2014-10-21 NOTE — ED Notes (Signed)
Attempted Report x2. 

## 2014-10-21 NOTE — ED Notes (Signed)
Transporting patient to new room assignment. 

## 2014-10-22 ENCOUNTER — Observation Stay (HOSPITAL_COMMUNITY): Payer: Medicare Other

## 2014-10-22 DIAGNOSIS — R072 Precordial pain: Secondary | ICD-10-CM | POA: Diagnosis not present

## 2014-10-22 DIAGNOSIS — I10 Essential (primary) hypertension: Secondary | ICD-10-CM | POA: Diagnosis not present

## 2014-10-22 DIAGNOSIS — F0390 Unspecified dementia without behavioral disturbance: Secondary | ICD-10-CM

## 2014-10-22 DIAGNOSIS — R0789 Other chest pain: Secondary | ICD-10-CM | POA: Diagnosis not present

## 2014-10-22 DIAGNOSIS — F418 Other specified anxiety disorders: Secondary | ICD-10-CM

## 2014-10-22 LAB — TROPONIN I
Troponin I: <0.03 ng/mL (ref <0.031)
Troponin I: <0.03 ng/mL (ref <0.031)

## 2014-10-22 MED ORDER — HYDRALAZINE HCL 20 MG/ML IJ SOLN
INTRAMUSCULAR | Status: AC
  Filled 2014-10-22: qty 1

## 2014-10-22 MED ORDER — SODIUM CHLORIDE 0.9 % IJ SOLN
80.0000 mg | INTRAMUSCULAR | Status: AC
  Administered 2014-10-22: 80 mg via INTRAVENOUS
  Filled 2014-10-22: qty 3.2

## 2014-10-22 MED ORDER — REGADENOSON 0.4 MG/5ML IV SOLN
0.4000 mg | Freq: Once | INTRAVENOUS | Status: AC
  Administered 2014-10-22: 0.4 mg via INTRAVENOUS

## 2014-10-22 MED ORDER — TECHNETIUM TC 99M SESTAMIBI GENERIC - CARDIOLITE
10.0000 | Freq: Once | INTRAVENOUS | Status: AC | PRN
  Administered 2014-10-22: 10 via INTRAVENOUS

## 2014-10-22 MED ORDER — REGADENOSON 0.4 MG/5ML IV SOLN
INTRAVENOUS | Status: AC
  Administered 2014-10-22: 0.4 mg via INTRAVENOUS
  Filled 2014-10-22: qty 5

## 2014-10-22 MED ORDER — TECHNETIUM TC 99M SESTAMIBI GENERIC - CARDIOLITE
30.0000 | Freq: Once | INTRAVENOUS | Status: AC | PRN
Start: 2014-10-22 — End: 2014-10-22
  Administered 2014-10-22: 30 via INTRAVENOUS

## 2014-10-22 NOTE — Discharge Summary (Signed)
Physician Discharge Summary  Darlene Wilson:[id number] DOB: 1927-03-29 DOA: 10/21/2014  PCP: Danise Edge, MD  Admit date: 10/21/2014 Discharge date: 10/22/2014  Recommendations for Outpatient Follow-up:  1. Pt will need to follow up with PCP in 2 weeks post discharge   Discharge Diagnoses:  Chest pain -EKG without any concerning ischemic changes -Troponins negative 3 -Nuclear medicine stress test--low risk, EF 55% -CT angiogram chest negative for pulmonary embolus -Continue Aggrenox after discharge -HEART score was 5 Essential hypertension -The patient will be continued on lisinopril 5 mg twice a day History of stroke with right-sided deficits  -Continue Aggrenox  Vascular dementia  -Continue Aricept  -This was discussed with the patient's daughter who stated the patient has 4 hours of daily care as well as family visits on a daily basis -The family has arranged for the patient to be transferred to [facility] should her memory continued to worsen Hypothyroidism  -Continue Synthroid  Bradycardia  -The patient was asymptomatic  -need to be monitored on Aricept  Discharge Condition: stable  Disposition: home  Diet:cardiac Wt Readings from Last 3 Encounters:  10/21/14 58.9 kg (129 lb 13.6 oz)  09/08/14 60.555 kg (133 lb 8 oz)  04/07/14 61.326 kg (135 lb 3.2 oz)    History of present illness:  81 year old Caucasian female with past med historystroke with right-sided deficits, hypertension, vascular dementia, but highly functioning, who lives by herself  who presents complaints of shortness of breath and chest tightness with exertion. She has multiple risk factors including hypertension, age, previous history of stroke. Even though she is not a very good historian her symptoms are somewhat concerning for cardiac etiology.    Discharge Exam: Filed Vitals:   10/22/14 1200  BP: 165/77  Pulse: 79  Temp:   Resp:    Filed Vitals:   10/22/14 1149 10/22/14 1152  10/22/14 1157 10/22/14 1200  BP: 169/88 214/89 191/88 165/77  Pulse: 109 93 84 79  Temp:      TempSrc:      Resp:      Height:      Weight:      SpO2: 100% 100%     General: Awake and alert, NAD, pleasant, cooperative Cardiovascular: RRR, no rub, no gallop, no S3 Respiratory: CTAB, no wheeze, no rhonchi Abdomen:soft, nontender, nondistended, positive bowel sounds Extremities: No edema, No lymphangitis, no petechiae  Discharge Instructions  Discharge Instructions    Diet - low sodium heart healthy    Complete by:  As directed      Increase activity slowly    Complete by:  As directed             Medication List    STOP taking these medications        ondansetron 4 MG tablet  Commonly known as:  ZOFRAN      TAKE these medications        acetaminophen 325 MG tablet  Commonly known as:  TYLENOL  Take 2 tablets (650 mg total) by mouth every 6 (six) hours as needed.     citalopram 10 MG tablet  Commonly known as:  CELEXA  Take 1 tablet (10 mg total) by mouth daily.     dipyridamole-aspirin 200-25 MG per 12 hr capsule  Commonly known as:  AGGRENOX  TAKE 1 CAPSULE BY MOUTH 2 (TWO) TIMES DAILY.     donepezil 10 MG tablet  Commonly known as:  ARICEPT  Take 1 tablet (10 mg total) by mouth at bedtime.  gabapentin 100 MG capsule  Commonly known as:  NEURONTIN  TAKE 1 CAPSULE (100 MG TOTAL) BY MOUTH AT BEDTIME.     latanoprost 0.005 % ophthalmic solution  Commonly known as:  XALATAN  Place 1 drop into both eyes at bedtime.     levothyroxine 75 MCG tablet  Commonly known as:  SYNTHROID, LEVOTHROID  Take 1 tablet (75 mcg total) by mouth daily.     lisinopril 5 MG tablet  Commonly known as:  PRINIVIL,ZESTRIL  Take 1 tablet (5 mg total) by mouth 2 (two) times daily.     timolol 0.5 % ophthalmic solution  Commonly known as:  BETIMOL  Place 1 drop into both eyes every morning.         The results of significant diagnostics from this hospitalization  (including imaging, microbiology, ancillary and laboratory) are listed below for reference.    Significant Diagnostic Studies: Ct Angio Chest Pe W/cm &/or Wo Cm  10/21/2014   CLINICAL DATA:  Substernal chest pain radiates neck.  EXAM: CT ANGIOGRAPHY CHEST WITH CONTRAST  TECHNIQUE: Multidetector CT imaging of the chest was performed using the standard protocol during bolus administration of intravenous contrast. Multiplanar CT image reconstructions and MIPs were obtained to evaluate the vascular anatomy.  CONTRAST:  OMNIPAQUE IOHEXOL 350 MG/ML SOLN  COMPARISON:  Chest x-ray earlier today.  FINDINGS: No filling defects in the pulmonary arteries to suggest pulmonary emboli. Heart is normal size. Calcifications throughout the aorta without aneurysm. Scattered calcifications in the left coronary and right coronary arteries. No mediastinal, hilar, or axillary adenopathy.  No confluent airspace opacities.  No pleural effusions.  Chest wall soft tissues are unremarkable. Imaging into the upper abdomen shows no acute findings. No acute bony abnormality.  Review of the MIP images confirms the above findings.  IMPRESSION: No evidence of pulmonary embolus.  Scattered coronary artery calcifications.  No acute findings.   Electronically Signed   By: Charlett Nose M.D.   On: 10/21/2014 16:55   Nm Myocar Multi W/spect W/wall Motion / Ef  10/22/2014    The study is normal.  The left ventricular ejection fraction is hyperdynamic (>65%).  This is a low risk study.   Dg Chest Port 1 View  10/21/2014   CLINICAL DATA:  Chest pain and difficulty breathing for 1 day  EXAM: PORTABLE CHEST - 1 VIEW  COMPARISON:  November 20, 2012  FINDINGS: There is no edema or consolidation. There are scattered tiny granulomas bilaterally. Heart size and pulmonary vascularity are normal. No adenopathy. There is atherosclerotic change in aorta.  IMPRESSION: No edema or consolidation.  Scattered tiny granulomas bilaterally.   Electronically  Signed   By: Bretta Bang III M.D.   On: 10/21/2014 13:22     Microbiology: No results found for this or any previous visit (from the past 240 hour(s)).   Labs: Basic Metabolic Panel:  Recent Labs Lab 10/21/14 1330  NA 131*  K 4.0  CL 96*  CO2 27  GLUCOSE 112*  BUN 9  CREATININE 0.80  CALCIUM 8.9   Liver Function Tests: No results for input(s): AST, ALT, ALKPHOS, BILITOT, PROT, ALBUMIN in the last 168 hours. No results for input(s): LIPASE, AMYLASE in the last 168 hours. No results for input(s): AMMONIA in the last 168 hours. CBC:  Recent Labs Lab 10/21/14 1330  WBC 7.2  HGB 13.6  HCT 41.1  MCV 94.1  PLT 251   Cardiac Enzymes:  Recent Labs Lab 10/21/14 2031 10/22/14 0235 10/22/14  0835  TROPONINI <0.03 <0.03 <0.03   BNP: Invalid input(s): POCBNP CBG: No results for input(s): GLUCAP in the last 168 hours.  Time coordinating discharge:  Greater than 30 minutes  Signed:  Kammy Klett, DO [facility] Pager: [phone] 10/22/2014, 3:30 PM

## 2014-10-22 NOTE — Progress Notes (Signed)
NURSING PROGRESS NOTE  Darlene SchmidtJanie B Wilson [id number] Discharge Data: 10/22/2014 4:38 PM Attending Provider: Catarina Hartshornavid Tat, MD MWU:[id number], Misty StanleySTACEY, MD     Darlene Wilson to be D/C'd Home per MD order.  Discussed with the patient the After Visit Summary and all questions fully answered. All IV's discontinued with no bleeding noted. All belongings returned to patient for patient to take home.   Explained d/c orders to Patient's daughter-in-law [name] with patient's approval. She verbalized having full understanding.   Last Vital Signs:  Blood pressure 165/77, pulse 79, temperature 98.1 F (36.7 C), temperature source Oral, resp. rate 16, height 5\' 5"  (1.651 m), weight 58.9 kg (129 lb 13.6 oz), SpO2 100 %.  Discharge Medication List   Medication List    STOP taking these medications        ondansetron 4 MG tablet  Commonly known as:  ZOFRAN      TAKE these medications        acetaminophen 325 MG tablet  Commonly known as:  TYLENOL  Take 2 tablets (650 mg total) by mouth every 6 (six) hours as needed.     citalopram 10 MG tablet  Commonly known as:  CELEXA  Take 1 tablet (10 mg total) by mouth daily.     dipyridamole-aspirin 200-25 MG per 12 hr capsule  Commonly known as:  AGGRENOX  TAKE 1 CAPSULE BY MOUTH 2 (TWO) TIMES DAILY.     donepezil 10 MG tablet  Commonly known as:  ARICEPT  Take 1 tablet (10 mg total) by mouth at bedtime.     gabapentin 100 MG capsule  Commonly known as:  NEURONTIN  TAKE 1 CAPSULE (100 MG TOTAL) BY MOUTH AT BEDTIME.     latanoprost 0.005 % ophthalmic solution  Commonly known as:  XALATAN  Place 1 drop into both eyes at bedtime.     levothyroxine 75 MCG tablet  Commonly known as:  SYNTHROID, LEVOTHROID  Take 1 tablet (75 mcg total) by mouth daily.     lisinopril 5 MG tablet  Commonly known as:  PRINIVIL,ZESTRIL  Take 1 tablet (5 mg total) by mouth 2 (two) times daily.     timolol 0.5 % ophthalmic solution  Commonly known as:  BETIMOL  Place 1  drop into both eyes every morning.

## 2014-10-22 NOTE — Progress Notes (Signed)
The patient developed severe pain near the sternal notch.  I gave aminophylline with resolution.  BP was also severely elevated and 5mg  IV hydralazine was given with improvement.  Haylyn Halberg, PAC

## 2014-10-23 ENCOUNTER — Telehealth: Payer: Self-pay | Admitting: *Deleted

## 2014-10-23 NOTE — Addendum Note (Signed)
Addended by: Noreene LarssonLARSON, Giovana Faciane A on: 10/23/2014 01:32 PM   Modules accepted: Medications

## 2014-10-23 NOTE — Telephone Encounter (Addendum)
Transition Care Management Follow-up Telephone Call  How have you been since you were released from the hospital? "Right now I am doing fine, I am pooped out I have been walking in the yard getting exercise"  Denies shortness of breath, chest tightness   Do you understand why you were in the hospital? "Blood pressure had gone up and caused my chest to be tight"   Do you understand the discharge instrcutions? YES   Items Reviewed:  Medications reviewed: YES- patient was unaware to stop the Zofran, but states she has not taken it anyway, has been drinking ginger ale   Allergies reviewed: YES  Dietary changes reviewed: YES   Referrals reviewed:  YES     Functional Questionnaire:   Activities of Daily Living (ADLs):   She states they are independent in the following: ambulating, medications, bathing, restroom States they require assistance with the following: driving    Any transportation issues/concerns?: NO- step daughter will be driving    Any patient concerns? NO   Confirmed importance and date/time of follow-up visits scheduled: YES- scheduled with Dr. Abner GreenspanBlyth 10/28/14 at 1:30.     Confirmed with patient if condition begins to worsen call PCP or go to the ER.  Patient was given the Call-a-Nurse line [phone]: YES

## 2014-10-23 NOTE — Telephone Encounter (Signed)
Unable to reach patient at time of TCM Call. Left message for patient to return call when available.  

## 2014-10-24 LAB — NM MYOCAR MULTI W/SPECT W/WALL MOTION / EF
CHL CUP NUCLEAR SSS: 31
LHR: 0.74
LV sys vol: 3 mL
LVDIAVOL: 39 mL
Nuc Stress EF: 92 %
SDS: 12
SRS: 19
TID: 0.9

## 2014-10-28 ENCOUNTER — Encounter: Payer: Self-pay | Admitting: Family Medicine

## 2014-10-28 ENCOUNTER — Ambulatory Visit: Payer: Medicare Other | Admitting: Family Medicine

## 2014-10-31 ENCOUNTER — Encounter: Payer: Self-pay | Admitting: Medical

## 2014-10-31 ENCOUNTER — Ambulatory Visit (INDEPENDENT_AMBULATORY_CARE_PROVIDER_SITE_OTHER): Payer: Medicare Other | Admitting: Medical

## 2014-10-31 VITALS — BP 168/90 | HR 67 | Temp 98.9°F | Ht 65.0 in | Wt 132.8 lb

## 2014-10-31 DIAGNOSIS — I10 Essential (primary) hypertension: Secondary | ICD-10-CM | POA: Diagnosis not present

## 2014-10-31 DIAGNOSIS — R0789 Other chest pain: Secondary | ICD-10-CM

## 2014-10-31 DIAGNOSIS — I639 Cerebral infarction, unspecified: Secondary | ICD-10-CM

## 2014-10-31 MED ORDER — LISINOPRIL 20 MG PO TABS: 20.0000 mg | ORAL_TABLET | Freq: Every day | ORAL | Status: DC

## 2014-10-31 NOTE — Progress Notes (Signed)
Pre visit review using our clinic review tool, if applicable. No additional management support is needed unless otherwise documented below in the visit note. 

## 2014-10-31 NOTE — Patient Instructions (Signed)
Hypertension Pt bp is still high on lisinopril 20 mg q day. Check bp occasinally and document readings. On follow up get cmp.   Stroke History of stroke. Continue aggrenox.   Chest tightness And sob when admitted to hospital. Studies were negative. But do to risk factors if any chest pain, sob, or neuro symptoms occur then ED evaluation.  No symptoms currently.    Follow up in 2 wks or as needed

## 2014-10-31 NOTE — Assessment & Plan Note (Addendum)
Pt bp is still high on lisinopril 20 mg q day. Check bp occasinally and document readings. On follow up get cmp.

## 2014-10-31 NOTE — Assessment & Plan Note (Signed)
History of stroke. Continue aggrenox.

## 2014-10-31 NOTE — Progress Notes (Signed)
   Subjective:    Patient ID: Darlene SchmidtJanie B Wilson, female    DOB: [birth year], 81 y.o.   MRN: [id number]  HPI  Pt when states since hosptilization dc she has felt fine. No chest and no sob. No neurologic signs or symptoms. Pt had negative work up in hospital. Pt is still on aggrenox.   Pt was negative for PE. nuclear med stress test low risk.  Pt had occasional chest pain in past but not severe as the other day. Most of time pain is for seconds, light and transient.     Review of Systems  Constitutional: Negative for fever, chills, diaphoresis, activity change and fatigue.  Respiratory: Negative for cough, chest tightness and shortness of breath.   Cardiovascular: Negative for chest pain, palpitations and leg swelling.  Gastrointestinal: Negative for nausea, vomiting and abdominal pain.  Musculoskeletal: Negative for neck pain and neck stiffness.  Neurological: Negative for dizziness, tremors, seizures, syncope, facial asymmetry, speech difficulty, weakness, light-headedness, numbness and headaches.  Psychiatric/Behavioral: Negative for behavioral problems, confusion and agitation. The patient is not nervous/anxious.        Objective:   Physical Exam  General Mental Status- Alert. General Appearance- Not in acute distress.   Skin General: Color- Normal Color. Moisture- Normal Moisture.  Neck Carotid Arteries- Normal color. Moisture- Normal Moisture. No carotid bruits. No JVD.  Chest and Lung Exam Auscultation: Breath Sounds:-Normal.  Cardiovascular Auscultation:Rythm- Regular. Murmurs & Other Heart Sounds:Auscultation of the heart reveals- No Murmurs.  Abdomen Inspection:-Inspeection Normal. Palpation/Percussion:Note:No mass. Palpation and Percussion of the abdomen reveal- Non Tender, Non Distended + BS, no rebound or guarding.    Neurologic Cranial Nerve exam:- CN III-XII intact(No nystagmus), symmetric smile. Drift Test:- No drift. Finger to Nose:-  Normal/Intact Strength:- 5/5 equal and symmetric strength both upper and lower extremities.      Assessment & Plan:

## 2014-10-31 NOTE — Assessment & Plan Note (Addendum)
And sob when admitted to hospital. Studies were negative. But do to risk factors if any chest pain, sob, or neuro symptoms occur then ED evaluation.  No symptoms currently.

## 2014-11-06 ENCOUNTER — Other Ambulatory Visit: Payer: Self-pay | Admitting: Family Medicine

## 2014-11-10 ENCOUNTER — Encounter: Payer: Self-pay | Admitting: Family Medicine

## 2014-11-13 ENCOUNTER — Telehealth: Payer: Self-pay | Admitting: *Deleted

## 2014-11-13 NOTE — Telephone Encounter (Signed)
Received FL2 and diet orders via fax from [facility]. Forms filled out as much as possible and forwarded to Dr. Abner Greenspan. JG//CMA

## 2014-11-14 ENCOUNTER — Encounter: Payer: Self-pay | Admitting: Medical

## 2014-11-14 ENCOUNTER — Ambulatory Visit (INDEPENDENT_AMBULATORY_CARE_PROVIDER_SITE_OTHER): Payer: Medicare Other | Admitting: Medical

## 2014-11-14 VITALS — BP 182/95 | HR 66 | Temp 98.7°F | Ht 65.0 in | Wt 134.2 lb

## 2014-11-14 DIAGNOSIS — I639 Cerebral infarction, unspecified: Secondary | ICD-10-CM

## 2014-11-14 DIAGNOSIS — I10 Essential (primary) hypertension: Secondary | ICD-10-CM | POA: Diagnosis not present

## 2014-11-14 DIAGNOSIS — Z022 Encounter for examination for admission to residential institution: Secondary | ICD-10-CM

## 2014-11-14 MED ORDER — AMLODIPINE BESYLATE 5 MG PO TABS: 5.0000 mg | ORAL_TABLET | Freq: Every day | ORAL | Status: DC

## 2014-11-14 MED ORDER — LISINOPRIL 10 MG PO TABS: ORAL_TABLET | ORAL | Status: DC

## 2014-11-14 NOTE — Progress Notes (Signed)
Subjective:    Patient ID: Darlene Wilson, female    DOB: August 01, 1926, 81 y.o.   MRN: [id number]  HPI   Pt in today. She is getting prepared to go to nursing home.  Pt will need appointment for ppd on Monday. Question from NH about her diet. She on low sodium diet for htn. Mild decreased gfr(so limiting protein good idea though her gfr is minimal decreased). Question on DNR status as well.  Pt bp is high. I increased her lisinopril from 10 mg to 20 mg on last visit. No blood pressure checks since then. No cardiac or neuro signs or symptoms.  Pt bp is 180/90 when I checked it today. No check at home by family.  Pt has living will and advanced directive per snap shot.  Pt does not want dnr presently after explained to her.     Review of Systems  Constitutional: Negative for fever, chills, diaphoresis, activity change and fatigue.  Respiratory: Negative for cough, chest tightness and shortness of breath.   Cardiovascular: Negative for chest pain, palpitations and leg swelling.  Gastrointestinal: Negative for nausea, vomiting and abdominal pain.  Musculoskeletal: Negative for neck pain and neck stiffness.  Neurological: Negative for dizziness, tremors, seizures, syncope, facial asymmetry, speech difficulty, weakness, light-headedness, numbness and headaches.  Psychiatric/Behavioral: Negative for behavioral problems, confusion and agitation. The patient is not nervous/anxious.    Past Medical History  Diagnosis Date  . History of chicken pox   . Glaucoma     both eyes  . Hypertension   . Chronic kidney disease 1998    stones  . Stroke 04/2007    x 3 total  . Thyroid disease     hypothyroid  . Fibrocystic breast 01/12/2011  . Insomnia 01/13/2011  . Urinary frequency 02/09/2011  . Vertigo 02/09/2011  . Spinal stenosis 06/14/2011  . Nausea 05/31/2012  . Memory loss 10/12/2012  . Depression with anxiety 09/14/2014    History   Social History  . Marital Status: Widowed   Spouse Name: N/A  . Number of Children: 2  . Years of Education: HS   Occupational History  . Retired    Social History Main Topics  . Smoking status: Never Smoker   . Smokeless tobacco: Never Used  . Alcohol Use: 0.6 oz/week    1 Glasses of wine per week     Comment: daily  . Drug Use: No  . Sexual Activity: Not Currently     Comment: lives alone, no dietary restrictions   Other Topics Concern  . Not on file   Social History Narrative   Patient lives at home alone.   Caffeine Use: 1-2 cups  daily    Past Surgical History  Procedure Laterality Date  . Abdominal hysterectomy  1963  . Back surgery  1989    discectomy L4-5 1989, good results  . Breast surgery  1970    biopsy, benign, b/l    Family History  Problem Relation Age of Onset  . Heart disease Mother   . Hypertension Mother   . Diabetes Mother   . Heart disease Father   . Hypertension Father   . Cancer Sister     breast cancer  . Mental illness Brother     service connected    Allergies  Allergen Reactions  . Ciprofloxacin Hives    Given with 2 other meds/unsure if truly allergic  . Combigan [Brimonidine Tartrate-Timolol] Other (See Comments)    Inflamation  . Detrol [  Tolterodine Tartrate] Hives    Given with 2 other medications/unsure if truly allergic  . Oxycodone Hives    Given with 2 other medications/unsure if truly allergic  . Penicillins Hives, Itching and Rash  . Sulfa Antibiotics Hives, Itching and Rash    Current Outpatient Prescriptions on File Prior to Visit  Medication Sig Dispense Refill  . acetaminophen (TYLENOL) 325 MG tablet Take 2 tablets (650 mg total) by mouth every 6 (six) hours as needed.    . citalopram (CELEXA) 10 MG tablet Take 1 tablet (10 mg total) by mouth daily. 30 tablet 3  . dipyridamole-aspirin (AGGRENOX) 200-25 MG per 12 hr capsule TAKE 1 CAPSULE BY MOUTH 2 (TWO) TIMES DAILY. 180 capsule 2  . donepezil (ARICEPT) 10 MG tablet Take 1 tablet (10 mg total) by mouth  at bedtime. 90 tablet 1  . gabapentin (NEURONTIN) 100 MG capsule TAKE 1 CAPSULE (100 MG TOTAL) BY MOUTH AT BEDTIME. 90 capsule 1  . latanoprost (XALATAN) 0.005 % ophthalmic solution Place 1 drop into both eyes at bedtime. 2.5 mL 0  . levothyroxine (SYNTHROID, LEVOTHROID) 75 MCG tablet Take 1 tablet (75 mcg total) by mouth daily. 90 tablet 1  . ondansetron (ZOFRAN) 4 MG tablet TAKE 1 TABLET EVERY 8 HOURS AS NEEDED FOR NAUSEA 90 tablet 0  . timolol (BETIMOL) 0.5 % ophthalmic solution Place 1 drop into both eyes every morning.       No current facility-administered medications on file prior to visit.    BP 182/95 mmHg  Pulse 66  Temp(Src) 98.7 F (37.1 C) (Oral)  Ht 5\' 5"  (1.651 m)  Wt 134 lb 3.2 oz (60.873 kg)  BMI 22.33 kg/m2  SpO2 97%       Objective:   Physical Exam  General Mental Status- Alert. General Appearance- Not in acute distress.   Skin General: Color- Normal Color. Moisture- Normal Moisture.  Neck Carotid Arteries- Normal color. Moisture- Normal Moisture. No carotid bruits. No JVD.  Chest and Lung Exam Auscultation: Breath Sounds:-Normal.  Cardiovascular Auscultation:Rythm- Regular. Murmurs & Other Heart Sounds:Auscultation of the heart reveals- No Murmurs.  Abdomen Inspection:-Inspeection Normal. Palpation/Percussion:Note:No mass. Palpation and Percussion of the abdomen reveal- Non Tender, Non Distended + BS, no rebound or guarding.    Neurologic Cranial Nerve exam:- CN III-XII intact(No nystagmus), symmetric smile. Strength:- 5/5 equal and symmetric strength both upper and lower extremities.      Assessment & Plan:

## 2014-11-14 NOTE — Patient Instructions (Addendum)
I explained DNR today and pt does not want DNR order. If after talking to family this changes can update.  Will rx lisinopril 10 mg twice a day. And amlodipine 5 mg a day.  Will fill our form on Monday when in for ppd. Family has forms and will bring back on Monday.

## 2014-11-14 NOTE — Assessment & Plan Note (Addendum)
Will rx lisinopril 10 mg twice a day. And amlodipine 5 mg a day. Pt family aware stopping the 20 mg lisinopril. Explained better to split the dose.  bp med change discussed with Dr. Abner Greenspan

## 2014-11-14 NOTE — Progress Notes (Signed)
Pre visit review using our clinic review tool, if applicable. No additional management support is needed unless otherwise documented below in the visit note. 

## 2014-11-17 ENCOUNTER — Ambulatory Visit (INDEPENDENT_AMBULATORY_CARE_PROVIDER_SITE_OTHER): Payer: Medicare Other

## 2014-11-17 ENCOUNTER — Encounter: Payer: Self-pay | Admitting: Medical

## 2014-11-17 VITALS — BP 165/62 | HR 71

## 2014-11-17 DIAGNOSIS — I10 Essential (primary) hypertension: Secondary | ICD-10-CM

## 2014-11-17 LAB — TB SKIN TEST
INDURATION: 0 mm
INDURATION: 0 mm
TB SKIN TEST: NEGATIVE
TB Skin Test: NEGATIVE

## 2014-11-17 NOTE — Progress Notes (Signed)
Pt here for tb skin test reading and blood pressure check.  Skin test-negative.  BP: 165/62, HR: 71.  Pt/family states patient is taking Lisinopril 20 mg daily (in the morning).  No acute distress noted.   Family present in the room.  Per family, pt is being admitted to nursing home on Wednesday.    Spoke with Ramon Dredge regarding bp reading.  The plan:  lisinopril 10 mg twice a day and add amlodipine 5 mg a day.  The same was discussed with patient and family.  They were able to repeat instructions using teach back method.    Per Office note:  "Will fill our form on Monday when in for ppd. Family has forms and will bring back on Monday"--family forgot paperwork.  Family stated they would resend paperwork via fax.

## 2014-11-17 NOTE — Telephone Encounter (Signed)
Caller name: Hilda Lias  Relation to pt: Risk manager from [employer] back number: [phone] and fax# [phone]   Reason for call:  Checking on the status of FL2

## 2014-11-17 NOTE — Progress Notes (Signed)
Spoke with daughter Kriste Basque regarding provider's recommendations.  Kriste Basque stated understanding and agreed.  Nurse Visit appointment scheduled on November 27, 2014 at 3 pm for repeat blood pressure.

## 2014-11-17 NOTE — Progress Notes (Signed)
Notify BP better but still room for improvement have her come back in for BP check end of next week and we can recheck with Amlodipine on board longer. If still hi may need to add another med

## 2014-11-17 NOTE — Telephone Encounter (Signed)
Currently w/ dr. Abner Greenspan

## 2014-11-18 ENCOUNTER — Telehealth: Payer: Self-pay | Admitting: Family Medicine

## 2014-11-18 NOTE — Telephone Encounter (Signed)
Called Darlene Wilson to discuss allergies, left message to call back.

## 2014-11-18 NOTE — Telephone Encounter (Signed)
I can fill out this paper on Thursday, did not see the paper today before leaving. Please see what we can do for them by phone

## 2014-11-18 NOTE — Telephone Encounter (Signed)
Faxed to Hilda Lias at [facility] at [phone].  Called left Hilda Lias a message results faxed.

## 2014-11-18 NOTE — Telephone Encounter (Signed)
Caller name: Marylene Land Relation to pt: Nurse from [facility] Call back number: [phone]   Reason for call:  Marylene Land stated she is sorry for short notice pt is trying to move in 11/19/14. Wellness Nurse faxed over "order clarification" & "FL2" requesting immediate fax back. In addition would like to discuss pt allergies etc. Please advise

## 2014-11-18 NOTE — Telephone Encounter (Signed)
Caller name: Hilda Lias from [facility] Relation to pt: Call back number: [phone] Pharmacy:  Reason for call:   Requesting TB results faxed to her. Fax [phone]

## 2014-11-19 NOTE — Telephone Encounter (Signed)
Gaye, I am not certain if another MD can sign off. Please advise.

## 2014-11-19 NOTE — Telephone Encounter (Signed)
I spoke with Dr. Beverely Low and she states to wait for Dr. Abner Greenspan to return tomorrow to complete paperwork. JG//CMA

## 2014-11-19 NOTE — Telephone Encounter (Signed)
I would think that Dr Abner Greenspan would be the one to sign.  You can ask another provider and if they are willing, if not, it will need to be addressed by PCP.

## 2014-11-19 NOTE — Telephone Encounter (Signed)
Paperwork received. I have filled out the allergy section. They are also requesting Diet and Code Status. I placed the form in Dr. Elby Showers' red folder. JG/CA

## 2014-11-19 NOTE — Telephone Encounter (Signed)
Marylene Land from [facility] called back requesting this today and is wanting to know if another Dr could sign off on this. Requesting callback on this

## 2014-11-20 NOTE — Telephone Encounter (Signed)
Caller name: Geraldine Contras with [facility] [place] Can be reached: [phone] FAX: [phone]  Reason for call: Needs paperwork clarifying orders for pt. Pt is scheduled to move in today and cannot do so until they have the paperwork in hand. Please fax in asap.

## 2014-11-20 NOTE — Telephone Encounter (Signed)
refaxed form with diet and code status filled out.

## 2014-11-20 NOTE — Telephone Encounter (Signed)
Faxed forms to [facility]

## 2014-11-24 ENCOUNTER — Encounter: Payer: Self-pay | Admitting: Family Medicine

## 2014-12-04 NOTE — Telephone Encounter (Signed)
Trying to close chart.

## 2014-12-05 ENCOUNTER — Telehealth: Payer: Self-pay | Admitting: *Deleted

## 2014-12-05 NOTE — Telephone Encounter (Signed)
Received paperwork from [employer]. Filled out as much as possible and forwarded to Dr. Abner GreenspanBlyth. JG//CMA

## 2014-12-08 DIAGNOSIS — R278 Other lack of coordination: Secondary | ICD-10-CM | POA: Diagnosis not present

## 2014-12-08 DIAGNOSIS — Z9181 History of falling: Secondary | ICD-10-CM | POA: Diagnosis not present

## 2014-12-08 DIAGNOSIS — M6281 Muscle weakness (generalized): Secondary | ICD-10-CM | POA: Diagnosis not present

## 2014-12-08 DIAGNOSIS — R2689 Other abnormalities of gait and mobility: Secondary | ICD-10-CM | POA: Diagnosis not present

## 2014-12-08 DIAGNOSIS — R262 Difficulty in walking, not elsewhere classified: Secondary | ICD-10-CM | POA: Diagnosis not present

## 2014-12-10 DIAGNOSIS — R262 Difficulty in walking, not elsewhere classified: Secondary | ICD-10-CM | POA: Diagnosis not present

## 2014-12-10 DIAGNOSIS — M6281 Muscle weakness (generalized): Secondary | ICD-10-CM | POA: Diagnosis not present

## 2014-12-10 DIAGNOSIS — Z9181 History of falling: Secondary | ICD-10-CM | POA: Diagnosis not present

## 2014-12-10 DIAGNOSIS — R278 Other lack of coordination: Secondary | ICD-10-CM | POA: Diagnosis not present

## 2014-12-10 DIAGNOSIS — R2689 Other abnormalities of gait and mobility: Secondary | ICD-10-CM | POA: Diagnosis not present

## 2014-12-11 DIAGNOSIS — R278 Other lack of coordination: Secondary | ICD-10-CM | POA: Diagnosis not present

## 2014-12-11 DIAGNOSIS — M6281 Muscle weakness (generalized): Secondary | ICD-10-CM | POA: Diagnosis not present

## 2014-12-11 DIAGNOSIS — R262 Difficulty in walking, not elsewhere classified: Secondary | ICD-10-CM | POA: Diagnosis not present

## 2014-12-11 DIAGNOSIS — Z9181 History of falling: Secondary | ICD-10-CM | POA: Diagnosis not present

## 2014-12-11 DIAGNOSIS — R2689 Other abnormalities of gait and mobility: Secondary | ICD-10-CM | POA: Diagnosis not present

## 2014-12-11 NOTE — Telephone Encounter (Signed)
Completed forms faxed successfully to [phone]. Sent for scanning. JG//CMA

## 2014-12-12 DIAGNOSIS — R278 Other lack of coordination: Secondary | ICD-10-CM | POA: Diagnosis not present

## 2014-12-12 DIAGNOSIS — Z9181 History of falling: Secondary | ICD-10-CM | POA: Diagnosis not present

## 2014-12-12 DIAGNOSIS — R262 Difficulty in walking, not elsewhere classified: Secondary | ICD-10-CM | POA: Diagnosis not present

## 2014-12-12 DIAGNOSIS — M6281 Muscle weakness (generalized): Secondary | ICD-10-CM | POA: Diagnosis not present

## 2014-12-12 DIAGNOSIS — R2689 Other abnormalities of gait and mobility: Secondary | ICD-10-CM | POA: Diagnosis not present

## 2014-12-15 ENCOUNTER — Ambulatory Visit (INDEPENDENT_AMBULATORY_CARE_PROVIDER_SITE_OTHER): Payer: Medicare Other | Admitting: Family Medicine

## 2014-12-15 ENCOUNTER — Encounter: Payer: Self-pay | Admitting: Family Medicine

## 2014-12-15 VITALS — BP 122/72 | HR 77 | Temp 97.6°F | Ht 65.0 in | Wt 133.1 lb

## 2014-12-15 DIAGNOSIS — R278 Other lack of coordination: Secondary | ICD-10-CM | POA: Diagnosis not present

## 2014-12-15 DIAGNOSIS — R262 Difficulty in walking, not elsewhere classified: Secondary | ICD-10-CM | POA: Diagnosis not present

## 2014-12-15 DIAGNOSIS — I679 Cerebrovascular disease, unspecified: Secondary | ICD-10-CM | POA: Diagnosis not present

## 2014-12-15 DIAGNOSIS — I10 Essential (primary) hypertension: Secondary | ICD-10-CM | POA: Diagnosis not present

## 2014-12-15 DIAGNOSIS — M6281 Muscle weakness (generalized): Secondary | ICD-10-CM | POA: Diagnosis not present

## 2014-12-15 DIAGNOSIS — E079 Disorder of thyroid, unspecified: Secondary | ICD-10-CM

## 2014-12-15 DIAGNOSIS — E785 Hyperlipidemia, unspecified: Secondary | ICD-10-CM

## 2014-12-15 DIAGNOSIS — R2689 Other abnormalities of gait and mobility: Secondary | ICD-10-CM | POA: Diagnosis not present

## 2014-12-15 DIAGNOSIS — M5030 Other cervical disc degeneration, unspecified cervical region: Secondary | ICD-10-CM

## 2014-12-15 DIAGNOSIS — I639 Cerebral infarction, unspecified: Secondary | ICD-10-CM | POA: Diagnosis not present

## 2014-12-15 DIAGNOSIS — Z9181 History of falling: Secondary | ICD-10-CM | POA: Diagnosis not present

## 2014-12-15 DIAGNOSIS — N200 Calculus of kidney: Secondary | ICD-10-CM

## 2014-12-15 DIAGNOSIS — G4700 Insomnia, unspecified: Secondary | ICD-10-CM

## 2014-12-15 DIAGNOSIS — R739 Hyperglycemia, unspecified: Secondary | ICD-10-CM | POA: Diagnosis not present

## 2014-12-15 NOTE — Patient Instructions (Signed)

## 2014-12-15 NOTE — Addendum Note (Signed)
Addended by: Marylouise StacksARTER, KAREN E on: 12/15/2014 12:12 PM   Modules accepted: Orders

## 2014-12-15 NOTE — Progress Notes (Signed)
Pre visit review using our clinic review tool, if applicable. No additional management support is needed unless otherwise documented below in the visit note. 

## 2014-12-16 DIAGNOSIS — M6281 Muscle weakness (generalized): Secondary | ICD-10-CM | POA: Diagnosis not present

## 2014-12-16 DIAGNOSIS — R262 Difficulty in walking, not elsewhere classified: Secondary | ICD-10-CM | POA: Diagnosis not present

## 2014-12-16 DIAGNOSIS — Z9181 History of falling: Secondary | ICD-10-CM | POA: Diagnosis not present

## 2014-12-16 DIAGNOSIS — R2689 Other abnormalities of gait and mobility: Secondary | ICD-10-CM | POA: Diagnosis not present

## 2014-12-16 DIAGNOSIS — R278 Other lack of coordination: Secondary | ICD-10-CM | POA: Diagnosis not present

## 2014-12-16 LAB — COMPREHENSIVE METABOLIC PANEL
ALK PHOS: 100 U/L (ref 39–117)
ALT: 12 U/L (ref 0–35)
AST: 20 U/L (ref 0–37)
Albumin: 4 g/dL (ref 3.5–5.2)
BUN: 12 mg/dL (ref 6–23)
CALCIUM: 9.4 mg/dL (ref 8.4–10.5)
CO2: 25 mEq/L (ref 19–32)
CREATININE: 0.88 mg/dL (ref 0.40–1.20)
Chloride: 103 mEq/L (ref 96–112)
GFR: 64.77 mL/min (ref >60.00)
Glucose, Bld: 79 mg/dL (ref 70–99)
Potassium: 4 mEq/L (ref 3.5–5.1)
SODIUM: 138 meq/L (ref 135–145)
Total Bilirubin: 0.3 mg/dL (ref 0.2–1.2)
Total Protein: 6.5 g/dL (ref 6.0–8.3)

## 2014-12-16 LAB — CBC
HEMATOCRIT: 40.6 % (ref 36.0–46.0)
HEMOGLOBIN: 13.3 g/dL (ref 12.0–15.0)
MCHC: 32.8 g/dL (ref 30.0–36.0)
MCV: 94.9 fl (ref 78.0–100.0)
Platelets: 260 10*3/uL (ref 150.0–400.0)
RBC: 4.28 Mil/uL (ref 3.87–5.11)
RDW: 14.6 % (ref 11.5–15.5)
WBC: 7.2 10*3/uL (ref 4.0–10.5)

## 2014-12-16 LAB — LIPID PANEL
Cholesterol: 132 mg/dL (ref 0–200)
HDL: 46.1 mg/dL (ref >39.00)
LDL CALC: 73 mg/dL (ref 0–99)
NONHDL: 85.9
TRIGLYCERIDES: 64 mg/dL (ref 0.0–149.0)
Total CHOL/HDL Ratio: 3
VLDL: 12.8 mg/dL (ref 0.0–40.0)

## 2014-12-16 LAB — HEMOGLOBIN A1C: HEMOGLOBIN A1C: 5.9 % (ref 4.6–6.5)

## 2014-12-16 LAB — TSH: TSH: 4.18 u[IU]/mL (ref 0.35–4.50)

## 2014-12-17 DIAGNOSIS — Z9181 History of falling: Secondary | ICD-10-CM | POA: Diagnosis not present

## 2014-12-17 DIAGNOSIS — R278 Other lack of coordination: Secondary | ICD-10-CM | POA: Diagnosis not present

## 2014-12-17 DIAGNOSIS — R262 Difficulty in walking, not elsewhere classified: Secondary | ICD-10-CM | POA: Diagnosis not present

## 2014-12-17 DIAGNOSIS — R2689 Other abnormalities of gait and mobility: Secondary | ICD-10-CM | POA: Diagnosis not present

## 2014-12-17 DIAGNOSIS — M6281 Muscle weakness (generalized): Secondary | ICD-10-CM | POA: Diagnosis not present

## 2014-12-18 DIAGNOSIS — Z9181 History of falling: Secondary | ICD-10-CM | POA: Diagnosis not present

## 2014-12-18 DIAGNOSIS — R262 Difficulty in walking, not elsewhere classified: Secondary | ICD-10-CM | POA: Diagnosis not present

## 2014-12-18 DIAGNOSIS — R2689 Other abnormalities of gait and mobility: Secondary | ICD-10-CM | POA: Diagnosis not present

## 2014-12-18 DIAGNOSIS — M6281 Muscle weakness (generalized): Secondary | ICD-10-CM | POA: Diagnosis not present

## 2014-12-18 DIAGNOSIS — R278 Other lack of coordination: Secondary | ICD-10-CM | POA: Diagnosis not present

## 2014-12-19 DIAGNOSIS — M6281 Muscle weakness (generalized): Secondary | ICD-10-CM | POA: Diagnosis not present

## 2014-12-19 DIAGNOSIS — R278 Other lack of coordination: Secondary | ICD-10-CM | POA: Diagnosis not present

## 2014-12-19 DIAGNOSIS — R2689 Other abnormalities of gait and mobility: Secondary | ICD-10-CM | POA: Diagnosis not present

## 2014-12-19 DIAGNOSIS — Z9181 History of falling: Secondary | ICD-10-CM | POA: Diagnosis not present

## 2014-12-19 DIAGNOSIS — R262 Difficulty in walking, not elsewhere classified: Secondary | ICD-10-CM | POA: Diagnosis not present

## 2014-12-22 DIAGNOSIS — R278 Other lack of coordination: Secondary | ICD-10-CM | POA: Diagnosis not present

## 2014-12-22 DIAGNOSIS — R262 Difficulty in walking, not elsewhere classified: Secondary | ICD-10-CM | POA: Diagnosis not present

## 2014-12-22 DIAGNOSIS — Z9181 History of falling: Secondary | ICD-10-CM | POA: Diagnosis not present

## 2014-12-22 DIAGNOSIS — M6281 Muscle weakness (generalized): Secondary | ICD-10-CM | POA: Diagnosis not present

## 2014-12-22 DIAGNOSIS — R2689 Other abnormalities of gait and mobility: Secondary | ICD-10-CM | POA: Diagnosis not present

## 2014-12-23 DIAGNOSIS — M6281 Muscle weakness (generalized): Secondary | ICD-10-CM | POA: Diagnosis not present

## 2014-12-23 DIAGNOSIS — R278 Other lack of coordination: Secondary | ICD-10-CM | POA: Diagnosis not present

## 2014-12-23 DIAGNOSIS — Z9181 History of falling: Secondary | ICD-10-CM | POA: Diagnosis not present

## 2014-12-23 DIAGNOSIS — R262 Difficulty in walking, not elsewhere classified: Secondary | ICD-10-CM | POA: Diagnosis not present

## 2014-12-23 DIAGNOSIS — R2689 Other abnormalities of gait and mobility: Secondary | ICD-10-CM | POA: Diagnosis not present

## 2014-12-24 DIAGNOSIS — R278 Other lack of coordination: Secondary | ICD-10-CM | POA: Diagnosis not present

## 2014-12-24 DIAGNOSIS — R2689 Other abnormalities of gait and mobility: Secondary | ICD-10-CM | POA: Diagnosis not present

## 2014-12-24 DIAGNOSIS — M6281 Muscle weakness (generalized): Secondary | ICD-10-CM | POA: Diagnosis not present

## 2014-12-24 DIAGNOSIS — R262 Difficulty in walking, not elsewhere classified: Secondary | ICD-10-CM | POA: Diagnosis not present

## 2014-12-24 DIAGNOSIS — Z9181 History of falling: Secondary | ICD-10-CM | POA: Diagnosis not present

## 2014-12-25 ENCOUNTER — Telehealth: Payer: Self-pay | Admitting: Family Medicine

## 2014-12-25 DIAGNOSIS — R2689 Other abnormalities of gait and mobility: Secondary | ICD-10-CM | POA: Diagnosis not present

## 2014-12-25 DIAGNOSIS — M6281 Muscle weakness (generalized): Secondary | ICD-10-CM | POA: Diagnosis not present

## 2014-12-25 DIAGNOSIS — R262 Difficulty in walking, not elsewhere classified: Secondary | ICD-10-CM | POA: Diagnosis not present

## 2014-12-25 DIAGNOSIS — R278 Other lack of coordination: Secondary | ICD-10-CM | POA: Diagnosis not present

## 2014-12-25 DIAGNOSIS — Z9181 History of falling: Secondary | ICD-10-CM | POA: Diagnosis not present

## 2014-12-25 NOTE — Telephone Encounter (Signed)
Caller name: Joyice Faster  Relation to pt: from [facility] Call back number: [phone] and fax # [phone]    Reason for call:  Received the plan of care for OT but did not receive PT plan care. Please advise

## 2014-12-26 DIAGNOSIS — R2689 Other abnormalities of gait and mobility: Secondary | ICD-10-CM | POA: Diagnosis not present

## 2014-12-26 DIAGNOSIS — M6281 Muscle weakness (generalized): Secondary | ICD-10-CM | POA: Diagnosis not present

## 2014-12-26 DIAGNOSIS — R262 Difficulty in walking, not elsewhere classified: Secondary | ICD-10-CM | POA: Diagnosis not present

## 2014-12-26 DIAGNOSIS — Z9181 History of falling: Secondary | ICD-10-CM | POA: Diagnosis not present

## 2014-12-26 DIAGNOSIS — R278 Other lack of coordination: Secondary | ICD-10-CM | POA: Diagnosis not present

## 2014-12-29 ENCOUNTER — Encounter: Payer: Self-pay | Admitting: Family Medicine

## 2014-12-29 DIAGNOSIS — Z9181 History of falling: Secondary | ICD-10-CM | POA: Diagnosis not present

## 2014-12-29 DIAGNOSIS — R278 Other lack of coordination: Secondary | ICD-10-CM | POA: Diagnosis not present

## 2014-12-29 DIAGNOSIS — M6281 Muscle weakness (generalized): Secondary | ICD-10-CM | POA: Diagnosis not present

## 2014-12-29 DIAGNOSIS — R262 Difficulty in walking, not elsewhere classified: Secondary | ICD-10-CM | POA: Diagnosis not present

## 2014-12-29 DIAGNOSIS — R2689 Other abnormalities of gait and mobility: Secondary | ICD-10-CM | POA: Diagnosis not present

## 2014-12-29 NOTE — Assessment & Plan Note (Signed)
Stable, asymptomatic. 

## 2014-12-29 NOTE — Assessment & Plan Note (Signed)
Stays active and uses Tylenol prn roughly once weekly.

## 2014-12-29 NOTE — Assessment & Plan Note (Signed)
On Levothyroxine, continue to monitor 

## 2014-12-29 NOTE — Assessment & Plan Note (Signed)
Encouraged good sleep hygiene such as dark, quiet room. No blue/green glowing lights such as computer screens in bedroom. No alcohol or stimulants in evening. Cut down on caffeine as able. Regular exercise is helpful but not just prior to bed time. May try Melatonin prn 

## 2014-12-29 NOTE — Assessment & Plan Note (Signed)
Well controlled, no changes to meds. Encouraged heart healthy diet such as the DASH diet and exercise as tolerated.  °

## 2014-12-29 NOTE — Progress Notes (Signed)
Darlene Wilson [id number] Oct 14, 1926 12/29/2014      Progress Note New Patient  Subjective  Chief Complaint  Chief Complaint  Patient presents with  . Follow-up    3 month    HPI  Patient is a 81 year old female in today for routine medical care. She is doing fairly well. No recent hospitalization or acute concerns. Is in today with family who concurs. Eating somewhat better, no recent falls. Continues to struggle with weakness and neck pain but manages with infrequent Tylenol use. Denies CP/palp/SOB/HA/congestion/fevers/GI or GU c/o. Taking meds as prescribed Past Medical History  Diagnosis Date  . History of chicken pox   . Glaucoma     both eyes  . Hypertension   . Chronic kidney disease 1998    stones  . Stroke 04/2007    x 3 total  . Thyroid disease     hypothyroid  . Fibrocystic breast 01/12/2011  . Insomnia 01/13/2011  . Urinary frequency 02/09/2011  . Vertigo 02/09/2011  . Spinal stenosis 06/14/2011  . Nausea 05/31/2012  . Memory loss 10/12/2012  . Depression with anxiety 09/14/2014    Past Surgical History  Procedure Laterality Date  . Abdominal hysterectomy  1963  . Back surgery  1989    discectomy L4-5 1989, good results  . Breast surgery  1970    biopsy, benign, b/l    Family History  Problem Relation Age of Onset  . Heart disease Mother   . Hypertension Mother   . Diabetes Mother   . Heart disease Father   . Hypertension Father   . Cancer Sister     breast cancer  . Mental illness Brother     service connected    History   Social History  . Marital Status: Widowed    Spouse Name: N/A  . Number of Children: 2  . Years of Education: HS   Occupational History  . Retired    Social History Main Topics  . Smoking status: Never Smoker   . Smokeless tobacco: Never Used  . Alcohol Use: 0.6 oz/week    1 Glasses of wine per week     Comment: daily  . Drug Use: No  . Sexual Activity: Not Currently     Comment: lives alone, no dietary restrictions    Other Topics Concern  . Not on file   Social History Narrative   Patient lives at home alone.   Caffeine Use: 1-2 cups  daily    Current Outpatient Prescriptions on File Prior to Visit  Medication Sig Dispense Refill  . acetaminophen (TYLENOL) 325 MG tablet Take 2 tablets (650 mg total) by mouth every 6 (six) hours as needed.    . citalopram (CELEXA) 10 MG tablet Take 1 tablet (10 mg total) by mouth daily. 30 tablet 3  . dipyridamole-aspirin (AGGRENOX) 200-25 MG per 12 hr capsule TAKE 1 CAPSULE BY MOUTH 2 (TWO) TIMES DAILY. 180 capsule 2  . donepezil (ARICEPT) 10 MG tablet Take 1 tablet (10 mg total) by mouth at bedtime. 90 tablet 1  . gabapentin (NEURONTIN) 100 MG capsule TAKE 1 CAPSULE (100 MG TOTAL) BY MOUTH AT BEDTIME. 90 capsule 1  . latanoprost (XALATAN) 0.005 % ophthalmic solution Place 1 drop into both eyes at bedtime. 2.5 mL 0  . levothyroxine (SYNTHROID, LEVOTHROID) 75 MCG tablet Take 1 tablet (75 mcg total) by mouth daily. 90 tablet 1  . lisinopril (PRINIVIL,ZESTRIL) 10 MG tablet 1 tab po bid 60 tablet 0  .  ondansetron (ZOFRAN) 4 MG tablet TAKE 1 TABLET EVERY 8 HOURS AS NEEDED FOR NAUSEA 90 tablet 0  . timolol (BETIMOL) 0.5 % ophthalmic solution Place 1 drop into both eyes every morning.      Marland Kitchen amLODipine (NORVASC) 5 MG tablet Take 1 tablet (5 mg total) by mouth daily. (Patient not taking: Reported on 12/15/2014) 30 tablet 3   No current facility-administered medications on file prior to visit.    Allergies  Allergen Reactions  . Ciprofloxacin Hives    Given with 2 other meds/unsure if truly allergic  . Combigan [Brimonidine Tartrate-Timolol] Other (See Comments)    Inflamation  . Detrol [Tolterodine Tartrate] Hives    Given with 2 other medications/unsure if truly allergic  . Oxycodone Hives    Given with 2 other medications/unsure if truly allergic  . Penicillins Hives, Itching and Rash  . Sulfa Antibiotics Hives, Itching and Rash    Review of Systems  Review  of Systems  Constitutional: Positive for malaise/fatigue. Negative for fever.  HENT: Negative for congestion.   Eyes: Negative for discharge.  Respiratory: Negative for shortness of breath.   Cardiovascular: Negative for chest pain, palpitations and leg swelling.  Gastrointestinal: Positive for nausea. Negative for abdominal pain and diarrhea.  Genitourinary: Negative for dysuria.  Musculoskeletal: Positive for back pain and neck pain. Negative for falls.  Skin: Negative for rash.  Neurological: Positive for weakness. Negative for loss of consciousness and headaches.  Endo/Heme/Allergies: Negative for polydipsia.  Psychiatric/Behavioral: Negative for depression and suicidal ideas. The patient is not nervous/anxious and does not have insomnia.     Objective  BP 122/72 mmHg  Pulse 77  Temp(Src) 97.6 F (36.4 C) (Oral)  Ht  (1.651 m)  Wt 133 lb 2 oz (60.385 kg)  BMI 22.15 kg/m2  SpO2 98%  Physical Exam  Physical Exam  Constitutional: She is oriented to person, place, and time and well-developed, well-nourished, and in no distress. No distress.  HENT:  Head: Normocephalic and atraumatic.  Eyes: Conjunctivae are normal.  Neck: Neck supple. No thyromegaly present.  Cardiovascular: Normal rate, regular rhythm and normal heart sounds.   Pulmonary/Chest: Effort normal and breath sounds normal. She has no wheezes.  Abdominal: She exhibits no distension and no mass.  Musculoskeletal: She exhibits no edema.  Lymphadenopathy:    She has no cervical adenopathy.  Neurological: She is alert and oriented to person, place, and time.  Skin: Skin is warm and dry. No rash noted. She is not diaphoretic.  Psychiatric: Memory, affect and judgment normal.       Assessment & Plan  Hypertension Well controlled, no changes to meds. Encouraged heart healthy diet such as the DASH diet and exercise as tolerated.   Thyroid disease On Levothyroxine, continue to monitor  Kidney  stone Stable, asymptomatic  Hyperglycemia  minimize simple carbs. Increase exercise as tolerated.   Degeneration of cervical intervertebral disc Stays active and uses Tylenol prn roughly once weekly.   Insomnia Encouraged good sleep hygiene such as dark, quiet room. No blue/green glowing lights such as computer screens in bedroom. No alcohol or stimulants in evening. Cut down on caffeine as able. Regular exercise is helpful but not just prior to bed time. May try Melatonin prn

## 2014-12-29 NOTE — Assessment & Plan Note (Signed)
minimize simple carbs. Increase exercise as tolerated.  

## 2014-12-29 NOTE — Telephone Encounter (Signed)
We only received OT plan of care.

## 2014-12-30 DIAGNOSIS — Z9181 History of falling: Secondary | ICD-10-CM | POA: Diagnosis not present

## 2014-12-30 DIAGNOSIS — M6281 Muscle weakness (generalized): Secondary | ICD-10-CM | POA: Diagnosis not present

## 2014-12-30 DIAGNOSIS — R262 Difficulty in walking, not elsewhere classified: Secondary | ICD-10-CM | POA: Diagnosis not present

## 2014-12-30 DIAGNOSIS — R278 Other lack of coordination: Secondary | ICD-10-CM | POA: Diagnosis not present

## 2014-12-30 DIAGNOSIS — R2689 Other abnormalities of gait and mobility: Secondary | ICD-10-CM | POA: Diagnosis not present

## 2014-12-31 DIAGNOSIS — Z9181 History of falling: Secondary | ICD-10-CM | POA: Diagnosis not present

## 2014-12-31 DIAGNOSIS — R2689 Other abnormalities of gait and mobility: Secondary | ICD-10-CM | POA: Diagnosis not present

## 2014-12-31 DIAGNOSIS — R262 Difficulty in walking, not elsewhere classified: Secondary | ICD-10-CM | POA: Diagnosis not present

## 2014-12-31 DIAGNOSIS — M6281 Muscle weakness (generalized): Secondary | ICD-10-CM | POA: Diagnosis not present

## 2014-12-31 DIAGNOSIS — R278 Other lack of coordination: Secondary | ICD-10-CM | POA: Diagnosis not present

## 2015-01-01 ENCOUNTER — Telehealth: Payer: Self-pay | Admitting: Family Medicine

## 2015-01-01 DIAGNOSIS — M6281 Muscle weakness (generalized): Secondary | ICD-10-CM | POA: Diagnosis not present

## 2015-01-01 DIAGNOSIS — R278 Other lack of coordination: Secondary | ICD-10-CM | POA: Diagnosis not present

## 2015-01-01 DIAGNOSIS — R2689 Other abnormalities of gait and mobility: Secondary | ICD-10-CM | POA: Diagnosis not present

## 2015-01-01 DIAGNOSIS — Z9181 History of falling: Secondary | ICD-10-CM | POA: Diagnosis not present

## 2015-01-01 DIAGNOSIS — R262 Difficulty in walking, not elsewhere classified: Secondary | ICD-10-CM | POA: Diagnosis not present

## 2015-01-01 NOTE — Telephone Encounter (Signed)
Caller name: Joyice Faster  Relation to pt: from [facility] Call back number: [phone] and fax # [phone]   Informed Joyice Faster to refax PT point of care to [phone] because it was not received.

## 2015-01-02 ENCOUNTER — Telehealth: Payer: Self-pay | Admitting: Family Medicine

## 2015-01-02 DIAGNOSIS — R2689 Other abnormalities of gait and mobility: Secondary | ICD-10-CM | POA: Diagnosis not present

## 2015-01-02 DIAGNOSIS — M6281 Muscle weakness (generalized): Secondary | ICD-10-CM | POA: Diagnosis not present

## 2015-01-02 DIAGNOSIS — R262 Difficulty in walking, not elsewhere classified: Secondary | ICD-10-CM | POA: Diagnosis not present

## 2015-01-02 DIAGNOSIS — Z9181 History of falling: Secondary | ICD-10-CM | POA: Diagnosis not present

## 2015-01-02 DIAGNOSIS — R278 Other lack of coordination: Secondary | ICD-10-CM | POA: Diagnosis not present

## 2015-01-02 NOTE — Telephone Encounter (Signed)
Advise results are on the counter

## 2015-01-02 NOTE — Telephone Encounter (Signed)
Caller name:[name] Relation to pt: Call back number:[phone] Pharmacy:  Reason for call: Yvonne Kendall, faxed over her UA results today, would like to get an order today to start a antibiotic for her UTI so she does not have to go through the weekend with no relief.

## 2015-01-02 NOTE — Telephone Encounter (Signed)
Called [place] at [phone] left [place] a detailed message of PCP's response to results.  Also did fax over results from [facility] with PCP signature as well as instructions.

## 2015-01-02 NOTE — Telephone Encounter (Signed)
Her urine culture was contaminated with all of her allergies and the contaminated urine cannot treat. Can have her return to urology or repeat UA and culture

## 2015-01-02 NOTE — Telephone Encounter (Signed)
Called left message to call bck

## 2015-01-05 NOTE — Telephone Encounter (Signed)
Faxed results/PCP instructions to [facility]. Also called the RN left detailed message of PCP instructions regarding results.

## 2015-01-06 DIAGNOSIS — R262 Difficulty in walking, not elsewhere classified: Secondary | ICD-10-CM | POA: Diagnosis not present

## 2015-01-06 DIAGNOSIS — R278 Other lack of coordination: Secondary | ICD-10-CM | POA: Diagnosis not present

## 2015-01-06 DIAGNOSIS — M6281 Muscle weakness (generalized): Secondary | ICD-10-CM | POA: Diagnosis not present

## 2015-01-06 DIAGNOSIS — R2689 Other abnormalities of gait and mobility: Secondary | ICD-10-CM | POA: Diagnosis not present

## 2015-01-06 DIAGNOSIS — Z9181 History of falling: Secondary | ICD-10-CM | POA: Diagnosis not present

## 2015-01-07 DIAGNOSIS — M6281 Muscle weakness (generalized): Secondary | ICD-10-CM | POA: Diagnosis not present

## 2015-01-07 DIAGNOSIS — R2689 Other abnormalities of gait and mobility: Secondary | ICD-10-CM | POA: Diagnosis not present

## 2015-01-07 DIAGNOSIS — Z9181 History of falling: Secondary | ICD-10-CM | POA: Diagnosis not present

## 2015-01-07 DIAGNOSIS — R262 Difficulty in walking, not elsewhere classified: Secondary | ICD-10-CM | POA: Diagnosis not present

## 2015-01-07 DIAGNOSIS — R278 Other lack of coordination: Secondary | ICD-10-CM | POA: Diagnosis not present

## 2015-01-09 DIAGNOSIS — M6281 Muscle weakness (generalized): Secondary | ICD-10-CM | POA: Diagnosis not present

## 2015-01-09 DIAGNOSIS — Z9181 History of falling: Secondary | ICD-10-CM | POA: Diagnosis not present

## 2015-01-09 DIAGNOSIS — R262 Difficulty in walking, not elsewhere classified: Secondary | ICD-10-CM | POA: Diagnosis not present

## 2015-01-09 DIAGNOSIS — R2689 Other abnormalities of gait and mobility: Secondary | ICD-10-CM | POA: Diagnosis not present

## 2015-01-09 DIAGNOSIS — R278 Other lack of coordination: Secondary | ICD-10-CM | POA: Diagnosis not present

## 2015-01-13 DIAGNOSIS — M6281 Muscle weakness (generalized): Secondary | ICD-10-CM | POA: Diagnosis not present

## 2015-01-13 DIAGNOSIS — R262 Difficulty in walking, not elsewhere classified: Secondary | ICD-10-CM | POA: Diagnosis not present

## 2015-01-13 DIAGNOSIS — R2689 Other abnormalities of gait and mobility: Secondary | ICD-10-CM | POA: Diagnosis not present

## 2015-01-13 DIAGNOSIS — Z9181 History of falling: Secondary | ICD-10-CM | POA: Diagnosis not present

## 2015-01-13 DIAGNOSIS — R278 Other lack of coordination: Secondary | ICD-10-CM | POA: Diagnosis not present

## 2015-01-14 DIAGNOSIS — Z9181 History of falling: Secondary | ICD-10-CM | POA: Diagnosis not present

## 2015-01-14 DIAGNOSIS — M6281 Muscle weakness (generalized): Secondary | ICD-10-CM | POA: Diagnosis not present

## 2015-01-14 DIAGNOSIS — R2689 Other abnormalities of gait and mobility: Secondary | ICD-10-CM | POA: Diagnosis not present

## 2015-01-14 DIAGNOSIS — R278 Other lack of coordination: Secondary | ICD-10-CM | POA: Diagnosis not present

## 2015-01-14 DIAGNOSIS — R262 Difficulty in walking, not elsewhere classified: Secondary | ICD-10-CM | POA: Diagnosis not present

## 2015-01-15 DIAGNOSIS — R2689 Other abnormalities of gait and mobility: Secondary | ICD-10-CM | POA: Diagnosis not present

## 2015-01-15 DIAGNOSIS — Z9181 History of falling: Secondary | ICD-10-CM | POA: Diagnosis not present

## 2015-01-15 DIAGNOSIS — R278 Other lack of coordination: Secondary | ICD-10-CM | POA: Diagnosis not present

## 2015-01-15 DIAGNOSIS — M6281 Muscle weakness (generalized): Secondary | ICD-10-CM | POA: Diagnosis not present

## 2015-01-15 DIAGNOSIS — R262 Difficulty in walking, not elsewhere classified: Secondary | ICD-10-CM | POA: Diagnosis not present

## 2015-02-12 ENCOUNTER — Encounter: Payer: Self-pay | Admitting: Neurology

## 2015-02-12 ENCOUNTER — Ambulatory Visit (INDEPENDENT_AMBULATORY_CARE_PROVIDER_SITE_OTHER): Payer: Medicare Other | Admitting: Neurology

## 2015-02-12 VITALS — BP 127/70 | HR 62 | Ht 65.0 in | Wt 137.4 lb

## 2015-02-12 DIAGNOSIS — I6990 Unspecified sequelae of unspecified cerebrovascular disease: Secondary | ICD-10-CM | POA: Diagnosis not present

## 2015-02-12 DIAGNOSIS — I639 Cerebral infarction, unspecified: Secondary | ICD-10-CM | POA: Diagnosis not present

## 2015-02-12 NOTE — Patient Instructions (Addendum)
I had a long discussion with the patient and daughter regarding her remote history of TIAs and mild dementia with of which appear to be stable. Continue Aggrenox for stroke prevention with strict control of hypertension with blood pressure goal below 130/90. Continue Aricept for dementia. I have advised the patient about fall and safety precautions. Return for follow-up in 6 months or call earlier if necessary  Fall Prevention and Home Safety Falls cause injuries and can affect all age groups. It is possible to prevent falls.  HOW TO PREVENT FALLS  Wear shoes with rubber soles that do not have an opening for your toes.  Keep the inside and outside of your house well lit.  Use night lights throughout your home.  Remove clutter from floors.  Clean up floor spills.  Remove throw rugs or fasten them to the floor with carpet tape.  Do not place electrical cords across pathways.  Put grab bars by your tub, shower, and toilet. Do not use towel bars as grab bars.  Put handrails on both sides of the stairway. Fix loose handrails.  Do not climb on stools or stepladders, if possible.  Do not wax your floors.  Repair uneven or unsafe sidewalks, walkways, or stairs.  Keep items you use a lot within reach.  Be aware of pets.  Keep emergency numbers next to the telephone.  Put smoke detectors in your home and near bedrooms. Ask your doctor what other things you can do to prevent falls. Document Released: 03/12/2009 Document Revised: 11/15/2011 Document Reviewed: 08/16/2011 [facility] Patient Information 2015 [place], [place]. This information is not intended to replace advice given to you by your health care provider. Make sure you discuss any questions you have with your health care provider.

## 2015-02-12 NOTE — Progress Notes (Signed)
Darlene Wilson    PATIENT: Darlene Wilson DOB: [birth year]  REASON FOR VISIT: follow up HISTORY FROM: patient  HISTORY OF PRESENT ILLNESS: Darlene Wilson, 81 year old white female returns today for followup. She was last seen in this office 10/12/11 by Dr. Pearlean Brownie. She has a history of TIA and is on Aggrenox. She has a history of 3 TIAs in the past. She went to the emergency room on Oct 14, 2009 for some weakness, numbness in her lip and tingling in the feet, it was characterized as generalized weakness. Her lab values were normal. MRI of the brain was found to be negative for any acute event. Mild to moderate small vessel disease changes. Vascular risk factors include hypertension and history of TIA's in addition to a history of hypothyroidism  Reports an episode of dizziness where she felt like the room was spinning around that lasted approximately 2-3 hours, she was seen at [facility] ER on 08/12/11 and was diagnosed with vertigo. She was prescribed Antivert but has not used. She did take Dramamine the first day with relief. CT scan of head showed no acute finding and atrophy and chronic microvascular ischemic change. Cholesterol levels were checked by her PCP and report they were normal, I do not have the results today. Denies any falls, ambulates with cane. Denies any new neurologic symptoms. Has right foot numbness since her stroke. Taking gabapentin Continues to exercise routinely. Blood pressure today is 120/60. Reports a fullness in her head, will take Tylenol with relief.  05/09/12: Returns for followup with daughter. No further stroke or TIA symptoms. Taking aggrenox without significant bruising. Parathesias not completely controlled with low dose Gabapentin. Continues to exercise several times weekly. No new complaints   UPDATE 01/30/13 (LL):  Patient returns for follow up visit, no new physical complaints. Paresthesias under control. Continues twice daily Aggrenox for stroke prevention, denies side  effects or significant bruising. States that she is having some short term memory problems. Daughter that accompanies her shakes her head in agreement when her mother isn't looking.  UPDATE 08/01/13 (LL):  Patient returns for follow up visit, no new physical complaints.  She was started on Donepizil by Dr. Rogelia Rohrer, taking 10 mg daily and tolerating it well.  Continues twice daily Aggrenox for stroke prevention, denies side effects or significant bruising.  Continues to exercise several times weekly, BP is well controlled, it is 126/74 in office today.  UPDATE 02/11/14 : (PS) : She returns for followup after last visit 6 months ago. She is accompanied by her daughter. Patient remains independent in activities of daily living except the daughter and was her finances and ranges from medications. She does occasionally get confused about taking her medicines. There have been no safety concerns, falls, agitation, but hallucinations delusions or behavioral concerns. She has not had a recurrent stroke or TIAs. She is tolerating Aggrenox well without side effects. Her blood pressure is usually well controlled though today it is slightly elevated at 136/71. She had last lipid profile checked 6 months ago by primary physician and was excellent. Patient was started on Aricept following the last visit and she is tolerating it well without side effects. Her Mini-Mental status exam testing score today is  22/30 today which is improved from 19/30 from last visit. Update 02/13/2015 : She returns for follow-up after last visit a year ago. She is accompanied by her daughter Darlene Wilson. Patient has since moved into assisted living as the family is concerned as she left the burner on  for the store couple of occasions. She continues to have poor short-term memory. Medications and I'll supervise in assisted living. She's not had any recurrent stroke or TIA symptoms. She did have significant blood pressure elevation a few months ago and her  blood pressure medications were increased and today it's doing much better at 127/70. She's had no recent falls. She does not have any hallucinations delusions or behavioral agitation issues. She had follow-up carotid ultrasound done on 02/19/14 which showed minor plaques bilaterally without hemodynamically significant stenosis. She remains on Aggrenox which is tolerating well without side effects  REVIEW OF SYSTEMS: Full 14 system review of systems performed and notable only for:  Memory loss, gait difficulties, confusion and all other systems negative  ALLERGIES: Allergies  Allergen Reactions  . Ciprofloxacin Hives    Given with 2 other meds/unsure if truly allergic  . Combigan [Brimonidine Tartrate-Timolol] Other (See Comments)    Inflamation  . Detrol [Tolterodine Tartrate] Hives    Given with 2 other medications/unsure if truly allergic  . Oxycodone Hives    Given with 2 other medications/unsure if truly allergic  . Penicillins Hives, Itching and Rash  . Sulfa Antibiotics Hives, Itching and Rash    HOME MEDICATIONS: Outpatient Prescriptions Prior to Visit  Medication Sig Dispense Refill  . acetaminophen (TYLENOL) 325 MG tablet Take 2 tablets (650 mg total) by mouth every 6 (six) hours as needed.    Darlene Wilson amLODipine (NORVASC) 5 MG tablet Take 1 tablet (5 mg total) by mouth daily. 30 tablet 3  . citalopram (CELEXA) 10 MG tablet Take 1 tablet (10 mg total) by mouth daily. 30 tablet 3  . dipyridamole-aspirin (AGGRENOX) 200-25 MG per 12 hr capsule TAKE 1 CAPSULE BY MOUTH 2 (TWO) TIMES DAILY. 180 capsule 2  . donepezil (ARICEPT) 10 MG tablet Take 1 tablet (10 mg total) by mouth at bedtime. 90 tablet 1  . gabapentin (NEURONTIN) 100 MG capsule TAKE 1 CAPSULE (100 MG TOTAL) BY MOUTH AT BEDTIME. 90 capsule 1  . latanoprost (XALATAN) 0.005 % ophthalmic solution Place 1 drop into both eyes at bedtime. 2.5 mL 0  . levothyroxine (SYNTHROID, LEVOTHROID) 75 MCG tablet Take 1 tablet (75 mcg total) by  mouth daily. 90 tablet 1  . lisinopril (PRINIVIL,ZESTRIL) 10 MG tablet 1 tab po bid 60 tablet 0  . ondansetron (ZOFRAN) 4 MG tablet TAKE 1 TABLET EVERY 8 HOURS AS NEEDED FOR NAUSEA 90 tablet 0  . timolol (BETIMOL) 0.5 % ophthalmic solution Place 1 drop into both eyes every morning.       No facility-administered medications prior to visit.     PHYSICAL EXAM  Filed Vitals:   02/12/15 1517  BP: 127/70  Pulse: 62  Height: 5\' 5"  (1.651 m)  Weight: 137 lb 6.4 oz (62.324 kg)   Body mass index is 22.86 kg/(m^2).  Generalized: In no acute distress, pleasant frail elderly Caucasian female  Neck: Supple, no carotid bruits  Cardiac: Regular rate rhythm, no murmur Skin: Few petechiae   Neurologic Exam  Mental Status: Awake and fully alert. Follows 1,2 step commands. Mood and affect appropriate. No aphasia, apraxia or dysarthia.  MMSE 21/30 (22/30 last visit) WITH DEFICITS IN ORIENTATION TO TIME AND PLACE; ATTENTION AND CALCULATION, DELAYED RECALL 3/3. CLOCK DRAWING 2/4.  Darlene Wilson ANIMAL FLUENCY SCORE 14.  CANNOT COPY INTERSECTING POLYGONS.    Cranial Nerves: Pupils equal, briskly reactive to light. Extraocular movements full without nystagmus. Visual fields full to confrontation. Hearing intact and symmetric to finger snap.  Facial sensation intact. Face, tongue, palate move normally and symmetrically.  Motor: Normal bulk and tone. Normal strength in all tested extremity muscles.No drift.  Sensory: Intact to touch and temperature, pinprick and vibratory in all extremities.  Coordination: Rapid alternating movements normal in all extremities. Finger-to-nose and heel-to-shin performed accurately bilaterally.  Gait and Station: Arises from chair without difficulty. Stance is wide based.uses a cane to walk. Unable to heel, toe, and slight difficulty with tandem. Romberg negative.    Reflexes: 1+ and symmetric. Toes downgoing.   ASSESSMENT AND PLAN 81 year old female with a history of multiple TIA's most  recent Oct 14, 2009. Vascular risk factors inclued hypertension and previous TIA's. Headaches and paresthesias have improved. 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  Plan:    I had a long discussion with the patient and daughter regarding her remote history of TIAs and mild dementia with of which appear to be stable. Continue Aggrenox for stroke prevention with strict control of hypertension with blood pressure goal below 130/90. Continue Aricept for dementia which appears stable. I have advised the patient about fall and safety precautions. Return for follow-up in 6 months or call earlier if necessary  No orders of the defined types were placed in this encounter.   Return in about 6 months (around 08/12/2015).  Delia Heady, MD  02/12/2015, 4:09 PM [facility] [address], [address] [place], [place] [address] [phone]  Note: This document was prepared with digital dictation and possible smart phrase technology. Any transcriptional errors that result from this process are unintentional.  I reviewed the above note and documentation by the Nurse Practitioner and agree with the history, physical exam, assessment and plan as outlined above.

## 2015-02-20 ENCOUNTER — Telehealth: Payer: Self-pay | Admitting: Family Medicine

## 2015-02-20 DIAGNOSIS — H4011X3 Primary open-angle glaucoma, severe stage: Secondary | ICD-10-CM | POA: Diagnosis not present

## 2015-02-20 NOTE — Telephone Encounter (Signed)
Caller name: Marylene Land from [facility]  Relation to pt: Wellness Nurse Call back number: [phone]    Reason for call:  Returning your call

## 2015-02-23 NOTE — Telephone Encounter (Signed)
Caller name: Marylene Land from [facility]  Relation to pt: Wellness Nurse Call back number: [phone]  Fax# [phone]   Reason for call: Angela faxed urine lab results on 9/22 (UA) and 9/23 (urine culture). Pt has a UTI and needs medication. She said she expected [place] to call back Friday and she called over the weekend but on call said they were not permitted to call the MDs. Please fax order for antibiotics & signed face sheet from results to notify they were received. Please call to notify they are being sent in.

## 2015-02-23 NOTE — Telephone Encounter (Signed)
Called left message to call back 

## 2015-02-23 NOTE — Telephone Encounter (Signed)
Awaiting sensitivities.

## 2015-02-23 NOTE — Telephone Encounter (Signed)
Please advise on antibiotic 

## 2015-02-24 NOTE — Telephone Encounter (Signed)
Started on antibiotics per sensitivities

## 2015-02-24 NOTE — Telephone Encounter (Signed)
Received fax from [facility] with sensitivities. PCP did prescribe Keflex 500 mg tab po tid for 7 days. Faxed cover sheet with written prescription for Keflex to [phone], did received fax confirmation received.

## 2015-02-24 NOTE — Telephone Encounter (Signed)
I spoke to Marylene Land this morning and they are faxing over all results and stated PCP can document on cover sheet what she wants this patient to have and they can take care of.

## 2015-02-25 ENCOUNTER — Other Ambulatory Visit: Payer: Self-pay | Admitting: *Deleted

## 2015-02-25 NOTE — Telephone Encounter (Signed)
Spoke with Delmar and he reviewed pt's chart. He states that due to below (pt's sensitivities) that she can proceed with the Keflex since PCN is a minor sensitivity. Called and left message for Marylene Land at [facility] informing her. JG//CMA

## 2015-02-25 NOTE — Telephone Encounter (Signed)
Caller name: Marylene Land from [facility]  Relation to pt: Wellness Nurse Call back number: [phone]  Fax# [phone]  Marylene Land was notified by pharmacy that pt has allergy to penicillin and want to verify that Dr. Abner Greenspan wants to order Keflex. Please call Marylene Land to advise asap.

## 2015-02-25 NOTE — Telephone Encounter (Signed)
Fax from [facility] stating that patient needs Timolol eyedrops refilled.  Spoke to [place], daughter-in-law and notified her that patient would need to get these from eye doctor.  She states they saw eye dr recently and she will request from him.  No further needs at this time.

## 2015-03-08 ENCOUNTER — Inpatient Hospital Stay (HOSPITAL_COMMUNITY)
Admission: EM | Admit: 2015-03-08 | Discharge: 2015-03-11 | DRG: 689 | Disposition: A | Discharge status: Home Health | Payer: Medicare Other | Attending: Internal Medicine | Admitting: Internal Medicine

## 2015-03-08 ENCOUNTER — Emergency Department (HOSPITAL_COMMUNITY): Payer: Medicare Other

## 2015-03-08 ENCOUNTER — Encounter (HOSPITAL_COMMUNITY): Payer: Self-pay | Admitting: Emergency Medicine

## 2015-03-08 DIAGNOSIS — R404 Transient alteration of awareness: Secondary | ICD-10-CM | POA: Diagnosis not present

## 2015-03-08 DIAGNOSIS — E860 Dehydration: Secondary | ICD-10-CM | POA: Diagnosis not present

## 2015-03-08 DIAGNOSIS — E039 Hypothyroidism, unspecified: Secondary | ICD-10-CM | POA: Diagnosis present

## 2015-03-08 DIAGNOSIS — Z8249 Family history of ischemic heart disease and other diseases of the circulatory system: Secondary | ICD-10-CM

## 2015-03-08 DIAGNOSIS — N189 Chronic kidney disease, unspecified: Secondary | ICD-10-CM | POA: Diagnosis present

## 2015-03-08 DIAGNOSIS — H409 Unspecified glaucoma: Secondary | ICD-10-CM | POA: Diagnosis present

## 2015-03-08 DIAGNOSIS — E079 Disorder of thyroid, unspecified: Secondary | ICD-10-CM | POA: Diagnosis present

## 2015-03-08 DIAGNOSIS — N390 Urinary tract infection, site not specified: Secondary | ICD-10-CM | POA: Diagnosis not present

## 2015-03-08 DIAGNOSIS — R531 Weakness: Secondary | ICD-10-CM | POA: Diagnosis not present

## 2015-03-08 DIAGNOSIS — Z8673 Personal history of transient ischemic attack (TIA), and cerebral infarction without residual deficits: Secondary | ICD-10-CM

## 2015-03-08 DIAGNOSIS — E876 Hypokalemia: Secondary | ICD-10-CM | POA: Diagnosis present

## 2015-03-08 DIAGNOSIS — Z6821 Body mass index (BMI) 21.0-21.9, adult: Secondary | ICD-10-CM

## 2015-03-08 DIAGNOSIS — I129 Hypertensive chronic kidney disease with stage 1 through stage 4 chronic kidney disease, or unspecified chronic kidney disease: Secondary | ICD-10-CM | POA: Diagnosis present

## 2015-03-08 DIAGNOSIS — R930 Abnormal findings on diagnostic imaging of skull and head, not elsewhere classified: Secondary | ICD-10-CM | POA: Diagnosis not present

## 2015-03-08 DIAGNOSIS — F418 Other specified anxiety disorders: Secondary | ICD-10-CM | POA: Diagnosis present

## 2015-03-08 DIAGNOSIS — G9340 Encephalopathy, unspecified: Secondary | ICD-10-CM | POA: Diagnosis not present

## 2015-03-08 DIAGNOSIS — N179 Acute kidney failure, unspecified: Secondary | ICD-10-CM | POA: Diagnosis not present

## 2015-03-08 DIAGNOSIS — Z833 Family history of diabetes mellitus: Secondary | ICD-10-CM

## 2015-03-08 DIAGNOSIS — E440 Moderate protein-calorie malnutrition: Secondary | ICD-10-CM | POA: Diagnosis not present

## 2015-03-08 DIAGNOSIS — R55 Syncope and collapse: Secondary | ICD-10-CM | POA: Diagnosis not present

## 2015-03-08 DIAGNOSIS — F0390 Unspecified dementia without behavioral disturbance: Secondary | ICD-10-CM | POA: Diagnosis present

## 2015-03-08 DIAGNOSIS — I10 Essential (primary) hypertension: Secondary | ICD-10-CM | POA: Diagnosis present

## 2015-03-08 DIAGNOSIS — Z803 Family history of malignant neoplasm of breast: Secondary | ICD-10-CM

## 2015-03-08 LAB — COMPREHENSIVE METABOLIC PANEL
ALBUMIN: 4.7 g/dL (ref 3.5–5.0)
ALK PHOS: 103 U/L (ref 38–126)
ALT: 19 U/L (ref 14–54)
ANION GAP: 8 (ref 5–15)
AST: 33 U/L (ref 15–41)
BILIRUBIN TOTAL: 1.4 mg/dL — AB (ref 0.3–1.2)
BUN: 12 mg/dL (ref 6–20)
CALCIUM: 9.6 mg/dL (ref 8.9–10.3)
CO2: 28 mmol/L (ref 22–32)
Chloride: 102 mmol/L (ref 101–111)
Creatinine, Ser: 1.11 mg/dL — ABNORMAL HIGH (ref 0.44–1.00)
GFR calc Af Amer: 51 mL/min — ABNORMAL LOW (ref >60)
GFR calc non Af Amer: 44 mL/min — ABNORMAL LOW (ref >60)
GLUCOSE: 120 mg/dL — AB (ref 65–99)
Potassium: 4.4 mmol/L (ref 3.5–5.1)
SODIUM: 138 mmol/L (ref 135–145)
TOTAL PROTEIN: 8.1 g/dL (ref 6.5–8.1)

## 2015-03-08 LAB — CBC WITH DIFFERENTIAL/PLATELET
BASOS ABS: 0 10*3/uL (ref 0.0–0.1)
Basophils Relative: 0 %
EOS ABS: 0.1 10*3/uL (ref 0.0–0.7)
Eosinophils Relative: 1 %
HEMATOCRIT: 47.6 % — AB (ref 36.0–46.0)
HEMOGLOBIN: 16 g/dL — AB (ref 12.0–15.0)
LYMPHS PCT: 15 %
Lymphs Abs: 1.2 10*3/uL (ref 0.7–4.0)
MCH: 31.8 pg (ref 26.0–34.0)
MCHC: 33.6 g/dL (ref 30.0–36.0)
MCV: 94.6 fL (ref 78.0–100.0)
MONOS PCT: 6 %
Monocytes Absolute: 0.5 10*3/uL (ref 0.1–1.0)
NEUTROS PCT: 78 %
Neutro Abs: 6.4 10*3/uL (ref 1.7–7.7)
Platelets: 259 10*3/uL (ref 150–400)
RBC: 5.03 MIL/uL (ref 3.87–5.11)
RDW: 13.9 % (ref 11.5–15.5)
SMEAR REVIEW: ADEQUATE
WBC: 8.2 10*3/uL (ref 4.0–10.5)

## 2015-03-08 LAB — URINE MICROSCOPIC-ADD ON

## 2015-03-08 LAB — MAGNESIUM: Magnesium: 2.6 mg/dL — ABNORMAL HIGH (ref 1.7–2.4)

## 2015-03-08 LAB — I-STAT TROPONIN, ED
TROPONIN I, POC: 0 ng/mL (ref 0.00–0.08)
Troponin i, poc: 0 ng/mL (ref 0.00–0.08)

## 2015-03-08 LAB — URINALYSIS, ROUTINE W REFLEX MICROSCOPIC
GLUCOSE, UA: NEGATIVE mg/dL
Ketones, ur: NEGATIVE mg/dL
NITRITE: NEGATIVE
PH: 6.5 (ref 5.0–8.0)
Protein, ur: 30 mg/dL — AB
SPECIFIC GRAVITY, URINE: 1.022 (ref 1.005–1.030)
Urobilinogen, UA: 1 mg/dL (ref 0.0–1.0)

## 2015-03-08 LAB — PHOSPHORUS: PHOSPHORUS: 4.4 mg/dL (ref 2.5–4.6)

## 2015-03-08 MED ORDER — DEXTROSE 5 % IV SOLN
1.0000 g | Freq: Once | INTRAVENOUS | Status: AC
  Administered 2015-03-08: 1 g via INTRAVENOUS
  Filled 2015-03-08: qty 10

## 2015-03-08 MED ORDER — SODIUM CHLORIDE 0.9 % IV BOLUS (SEPSIS)
1000.0000 mL | Freq: Once | INTRAVENOUS | Status: AC
  Administered 2015-03-08: 1000 mL via INTRAVENOUS

## 2015-03-08 MED ORDER — GABAPENTIN 100 MG PO CAPS
100.0000 mg | ORAL_CAPSULE | Freq: Every day | ORAL | Status: DC
  Administered 2015-03-08 – 2015-03-10 (×3): 100 mg via ORAL
  Filled 2015-03-08 (×3): qty 1

## 2015-03-08 MED ORDER — ASPIRIN-DIPYRIDAMOLE ER 25-200 MG PO CP12
1.0000 | ORAL_CAPSULE | Freq: Two times a day (BID) | ORAL | Status: DC
  Administered 2015-03-08 – 2015-03-11 (×6): 1 via ORAL
  Filled 2015-03-08 (×6): qty 1

## 2015-03-08 MED ORDER — TIMOLOL MALEATE 0.5 % OP SOLN
1.0000 [drp] | Freq: Every day | OPHTHALMIC | Status: DC
  Administered 2015-03-09 – 2015-03-11 (×3): 1 [drp] via OPHTHALMIC
  Filled 2015-03-08: qty 5

## 2015-03-08 MED ORDER — ACETAMINOPHEN 325 MG PO TABS
650.0000 mg | ORAL_TABLET | Freq: Four times a day (QID) | ORAL | Status: DC | PRN
  Administered 2015-03-09 – 2015-03-11 (×3): 650 mg via ORAL
  Filled 2015-03-08 (×3): qty 2

## 2015-03-08 MED ORDER — LEVOTHYROXINE SODIUM 75 MCG PO TABS
75.0000 ug | ORAL_TABLET | Freq: Every day | ORAL | Status: DC
  Administered 2015-03-08 – 2015-03-11 (×4): 75 ug via ORAL
  Filled 2015-03-08 (×4): qty 1

## 2015-03-08 MED ORDER — ENOXAPARIN SODIUM 40 MG/0.4ML ~~LOC~~ SOLN
40.0000 mg | Freq: Every day | SUBCUTANEOUS | Status: DC
  Administered 2015-03-08 – 2015-03-10 (×3): 40 mg via SUBCUTANEOUS
  Filled 2015-03-08 (×3): qty 0.4

## 2015-03-08 MED ORDER — LISINOPRIL 10 MG PO TABS
10.0000 mg | ORAL_TABLET | Freq: Every day | ORAL | Status: DC
  Administered 2015-03-09 – 2015-03-11 (×3): 10 mg via ORAL
  Filled 2015-03-08 (×3): qty 1

## 2015-03-08 MED ORDER — CITALOPRAM HYDROBROMIDE 10 MG PO TABS
10.0000 mg | ORAL_TABLET | Freq: Every day | ORAL | Status: DC
  Administered 2015-03-09 – 2015-03-11 (×3): 10 mg via ORAL
  Filled 2015-03-08 (×3): qty 1

## 2015-03-08 MED ORDER — DONEPEZIL HCL 10 MG PO TABS
10.0000 mg | ORAL_TABLET | Freq: Every day | ORAL | Status: DC
  Administered 2015-03-08 – 2015-03-10 (×3): 10 mg via ORAL
  Filled 2015-03-08 (×3): qty 1

## 2015-03-08 MED ORDER — ONDANSETRON HCL 4 MG PO TABS: 4.0000 mg | ORAL_TABLET | Freq: Four times a day (QID) | ORAL | Status: DC | PRN

## 2015-03-08 MED ORDER — SODIUM CHLORIDE 0.9 % IJ SOLN
3.0000 mL | Freq: Two times a day (BID) | INTRAMUSCULAR | Status: DC
  Administered 2015-03-08: 3 mL via INTRAVENOUS

## 2015-03-08 MED ORDER — LATANOPROST 0.005 % OP SOLN
1.0000 [drp] | Freq: Every day | OPHTHALMIC | Status: DC
  Administered 2015-03-08 – 2015-03-10 (×3): 1 [drp] via OPHTHALMIC
  Filled 2015-03-08: qty 2.5

## 2015-03-08 MED ORDER — ONDANSETRON HCL 4 MG/2ML IJ SOLN
4.0000 mg | Freq: Four times a day (QID) | INTRAMUSCULAR | Status: DC | PRN
  Administered 2015-03-11: 4 mg via INTRAVENOUS
  Filled 2015-03-08: qty 2

## 2015-03-08 MED ORDER — AMLODIPINE BESYLATE 5 MG PO TABS
5.0000 mg | ORAL_TABLET | Freq: Every day | ORAL | Status: DC
  Administered 2015-03-08 – 2015-03-11 (×4): 5 mg via ORAL
  Filled 2015-03-08 (×4): qty 1

## 2015-03-08 MED ORDER — DEXTROSE 5 % IV SOLN
1.0000 g | INTRAVENOUS | Status: DC
  Administered 2015-03-09 – 2015-03-10 (×2): 1 g via INTRAVENOUS
  Filled 2015-03-08 (×3): qty 10

## 2015-03-08 MED ORDER — SODIUM CHLORIDE 0.9 % IV SOLN
INTRAVENOUS | Status: AC
  Administered 2015-03-08 – 2015-03-09 (×2): via INTRAVENOUS

## 2015-03-08 NOTE — ED Notes (Signed)
Bed: QM57 Expected date: 03/08/15 Expected time: 2:35 PM Means of arrival: Ambulance Comments: Near syncopal episode,

## 2015-03-08 NOTE — ED Notes (Signed)
Pt is from [facility], her son picked up pt and was at a Psychiatric nurse. Pt had a blank stare for approx a few seconds and then went back to normal baseline. Son was concerned and wanted her to be evaluated. Pt does have a HX of TIA. Pt is alert to person at this time, per ems and son this is pts baseline. No facial droop.

## 2015-03-08 NOTE — ED Provider Notes (Signed)
CSN: [id number]     Arrival date & time 03/08/15  1434 History   First MD Initiated Contact with Patient 03/08/15 1503     Chief Complaint  Patient presents with  . Altered Mental Status     (Consider location/radiation/quality/duration/timing/severity/associated sxs/prior Treatment) HPI Comments: [place] resident Out for 15-3min, walking around [employer] While walking in the store, felt nauseas, grabbed chest but denied chest pain As they were checking out felt lightheaded like she was going to faint Then syncope 2-5min Did not fall, was eased down Then another episode 1 minute, shaking right arm for 30 seconds, eyes straight ahead, body limp except for hand, no loss of bowel/bladder control Was confused following episode No hx of seizures, no prior hx of syncope Had UTI 2 weeks ago, completed abx No other new medications or illnesses      Patient is a 81 y.o. female presenting with altered mental status and syncope.  Altered Mental Status Associated symptoms: light-headedness, nausea and seizures (possible)   Associated symptoms: no abdominal pain, no fever, no headaches and no rash   Loss of Consciousness Episode history:  Multiple Most recent episode:  Today Timing:  Intermittent Chronicity:  New Associated symptoms: nausea and seizures (possible)   Associated symptoms: no chest pain, no fever, no headaches and no shortness of breath     Past Medical History  Diagnosis Date  . History of chicken pox   . Glaucoma     both eyes  . Hypertension   . Chronic kidney disease 1998    stones  . Stroke (HCC) 04/2007    x 3 total  . Thyroid disease     hypothyroid  . Fibrocystic breast 01/12/2011  . Insomnia 01/13/2011  . Urinary frequency 02/09/2011  . Vertigo 02/09/2011  . Spinal stenosis 06/14/2011  . Nausea 05/31/2012  . Memory loss 10/12/2012  . Depression with anxiety 09/14/2014  . Kidney stone     stones    Past Surgical History  Procedure Laterality  Date  . Abdominal hysterectomy  1963  . Back surgery  1989    discectomy L4-5 1989, good results  . Breast surgery  1970    biopsy, benign, b/l   Family History  Problem Relation Age of Onset  . Heart disease Mother   . Hypertension Mother   . Diabetes Mother   . Heart disease Father   . Hypertension Father   . Cancer Sister     breast cancer  . Mental illness Brother     service connected   Social History  Substance Use Topics  . Smoking status: Never Smoker   . Smokeless tobacco: Never Used  . Alcohol Use: 0.6 oz/week    1 Glasses of wine per week     Comment: daily   OB History    No data available     Review of Systems  Constitutional: Negative for fever.  HENT: Negative for sore throat.   Eyes: Negative for visual disturbance.  Respiratory: Negative for cough and shortness of breath.   Cardiovascular: Positive for syncope. Negative for chest pain.  Gastrointestinal: Positive for nausea. Negative for abdominal pain.  Genitourinary: Negative for difficulty urinating.  Musculoskeletal: Negative for back pain and neck pain.  Skin: Negative for rash.  Neurological: Positive for seizures (possible), syncope and light-headedness. Negative for headaches.  All other systems reviewed and are negative.     Allergies  Ciprofloxacin; Combigan; Detrol; Oxycodone; Penicillins; and Sulfa antibiotics  Home Medications  Prior to Admission medications   Medication Sig Start Date End Date Taking? Authorizing Provider  acetaminophen (TYLENOL) 325 MG tablet Take 2 tablets (650 mg total) by mouth every 6 (six) hours as needed. 11/21/12  Yes Christiane Ha, MD  amLODipine (NORVASC) 5 MG tablet Take 1 tablet (5 mg total) by mouth daily. 11/14/14  Yes Edward Saguier, PA-C  citalopram (CELEXA) 10 MG tablet Take 1 tablet (10 mg total) by mouth daily. 09/08/14  Yes [name], MD  dipyridamole-aspirin (AGGRENOX) 200-25 MG per 12 hr capsule TAKE 1 CAPSULE BY MOUTH 2 (TWO) TIMES  DAILY. 09/30/14  Yes [name], MD  donepezil (ARICEPT) 10 MG tablet Take 1 tablet (10 mg total) by mouth at bedtime. 09/08/14  Yes [name], MD  gabapentin (NEURONTIN) 100 MG capsule TAKE 1 CAPSULE (100 MG TOTAL) BY MOUTH AT BEDTIME. 07/31/14  Yes [name], MD  latanoprost (XALATAN) 0.005 % ophthalmic solution Place 1 drop into both eyes at bedtime. 05/02/14  Yes [name], MD  levothyroxine (SYNTHROID, LEVOTHROID) 75 MCG tablet Take 1 tablet (75 mcg total) by mouth daily. 06/25/14  Yes [name], MD  lisinopril (PRINIVIL,ZESTRIL) 10 MG tablet 1 tab po bid Patient taking differently: Take 10 mg by mouth daily.  11/14/14  Yes Edward Saguier, PA-C  timolol (BETIMOL) 0.5 % ophthalmic solution Place 1 drop into both eyes every morning.     Yes Historical Provider, MD  ondansetron (ZOFRAN) 4 MG tablet TAKE 1 TABLET EVERY 8 HOURS AS NEEDED FOR NAUSEA 11/06/14   [name], MD   BP 142/65 mmHg  Pulse 82  Temp(Src) 98.4 F (36.9 C) (Oral)  Resp 21  Ht 5\' 5"  (1.651 m)  Wt 127 lb 13.9 oz (58 kg)  BMI 21.28 kg/m2  SpO2 97% Physical Exam  Constitutional: She appears well-developed and well-nourished. No distress.  HENT:  Head: Normocephalic and atraumatic.  Eyes: Conjunctivae and EOM are normal.  Neck: Normal range of motion.  Cardiovascular: Normal rate, regular rhythm, normal heart sounds and intact distal pulses.  Exam reveals no gallop and no friction rub.   No murmur heard. Pulmonary/Chest: Effort normal and breath sounds normal. No respiratory distress. She has no wheezes. She has no rales.  Abdominal: Soft. She exhibits no distension. There is no tenderness. There is no guarding.  Musculoskeletal: She exhibits no edema or tenderness.  Neurological: She is alert. No cranial nerve deficit or sensory deficit. GCS eye subscore is 4. GCS verbal subscore is 5. GCS motor subscore is 6.  Oriented to location, self, (not date-baseline) Strength LLE 4/5 (baseline) strength  otherwise WNL   Skin: Skin is warm and dry. No rash noted. She is not diaphoretic. No erythema.  Nursing note and vitals reviewed.   ED Course  Procedures (including critical care time) Labs Review Labs Reviewed  CBC WITH DIFFERENTIAL/PLATELET - Abnormal; Notable for the following:    Hemoglobin 16.0 (*)    HCT 47.6 (*)    All other components within normal limits  COMPREHENSIVE METABOLIC PANEL - Abnormal; Notable for the following:    Glucose, Bld 120 (*)    Creatinine, Ser 1.11 (*)    Total Bilirubin 1.4 (*)    GFR calc non Af Amer 44 (*)    GFR calc Af Amer 51 (*)    All other components within normal limits  URINALYSIS, ROUTINE W REFLEX MICROSCOPIC (NOT AT [facility]) - Abnormal; Notable for the following:    APPearance  TURBID (*)    Hgb urine dipstick TRACE (*)    Bilirubin Urine SMALL (*)    Protein, ur 30 (*)    Leukocytes, UA LARGE (*)    All other components within normal limits  MAGNESIUM - Abnormal; Notable for the following:    Magnesium 2.6 (*)    All other components within normal limits  URINE MICROSCOPIC-ADD ON - Abnormal; Notable for the following:    Squamous Epithelial / LPF MANY (*)    Bacteria, UA MANY (*)    All other components within normal limits  COMPREHENSIVE METABOLIC PANEL - Abnormal; Notable for the following:    Potassium 3.2 (*)    Glucose, Bld 101 (*)    Calcium 8.5 (*)    Total Protein 5.4 (*)    Albumin 3.2 (*)    ALT 11 (*)    All other components within normal limits  URINE CULTURE  MRSA PCR SCREENING  PHOSPHORUS  TROPONIN I  CBC WITH DIFFERENTIAL/PLATELET  I-STAT TROPOININ, ED  I-STAT TROPOININ, ED    Imaging Review Ct Head Wo Contrast  03/08/2015   CLINICAL DATA:  Possible seizure. Brief episode of staring/unresponsiveness.  EXAM: CT HEAD WITHOUT CONTRAST  TECHNIQUE: Contiguous axial images were obtained from the base of the skull through the vertex without intravenous contrast.  COMPARISON:  Brain MRI 11/21/2012.  Head CT  11/20/2012.  FINDINGS: There is no evidence of acute cortical infarct, intracranial hemorrhage, mass, midline shift, or extra-axial fluid collection. Moderate cerebral atrophy is unchanged. Periventricular white-matter hypodensities are stable to slightly progressed compared to the prior CT and are nonspecific but compatible with moderate chronic small vessel ischemic disease.  Prior bilateral cataract extraction is noted. The visualized paranasal sinuses and mastoid air cells are clear. Calcified atherosclerosis is noted at the skullbase.  IMPRESSION: 1. No evidence of acute intracranial abnormality. 2. Moderate chronic small vessel ischemic disease and cerebral atrophy.   Electronically Signed   By: Sebastian Ache M.D.   On: 03/08/2015 16:40   I have personally reviewed and evaluated these images and lab results as part of my medical decision-making.   EKG Interpretation   Date/Time:  Sunday March 08 2015 15:45:58 EDT Ventricular Rate:  57 PR Interval:  203 QRS Duration: 82 QT Interval:  443 QTC Calculation: 431 R Axis:   20 Text Interpretation:  Sinus rhythm Repol abnrm suggests ischemia, lateral  leads ED PHYSICIAN INTERPRETATION AVAILABLE IN [facility] Confirmed  by TEST, Record ([id number]) on 03/09/2015 9:13:10 AM      MDM   Final diagnoses:  Syncope, unspecified syncope type  UTI (lower urinary tract infection)  Dehydration    81 year old female with a history of hypertension, stroke, dementia who presents with concern for 2 syncopal episodes.  DDx for syncope includes seizure, cardiac arrhythmia, MI, PE, electrolyte abnormality, hypovolemia including dehydration and anemia/GI bleed, infection.  Urinalysis concerning for UTI.   EKG evaluated by me and shows sinus rhythm with no sign of prolonged QTc, no brugada, no sign of HOCM, no ST abnormalities.  I-STAT troponin done given patient had clutched her chest prior to episode (although had denied pain) and was negative.  CT head  obtained given concern for possible seizure-like activity and showed no acute findings.  Neurologic exam at baseline and doubt acute CVA. CBC and CMP showed no sign of significant anemia or electrolyte abnormalities, mild hypnatremia..  Urinalysis was concerning for a urinary tract infection as possible contributer to episode.  Given multiple episodes today,  will plan to admit for observation.       Alvira Monday, MD 03/09/15 [phone]

## 2015-03-08 NOTE — H&P (Signed)
[facility] History and Physical  Darlene Wilson RUE:[id number] DOB: February 05, 1927 DOA: 03/08/2015  Referring physician: Alvira Monday, MD PCP: Danise Edge, MD   Chief Complaint: Loss of consciousness.  HPI: Darlene Wilson is a 81 y.o. female with a past medical history of glaucoma, hypertension, CVA 3, hypothyroidism, spinal stenosis, depression with anxiety was brought to the emergency department after having 2 episodes of loss of consciousness while shopping at a [employer]. Per patient's son, she was walking in the store with her daughter-in-law and another person, when she started feeling nauseous and lightheaded. She lost her strength and balance and was helped by them throughout her to fall and was placed on the floor. Her loss of consciousness lasted for about 2-3 minutes, there was no tonic-clonic movements, tongue biting, sphincter incontinence or postictal period. Her son states, that shortly after that she had a second episode that lasted for about 30 seconds or so, during this episode she moves her right upper extremity, but again there was no other indications of this being a seizure. The patient does not have any recollection of the events, but she has mild dementia with short-term memory impairment. She did not have a postictal period on this second instance.  She has been recently treated for an UTI with an unknown antibiotic as an outpatient. She denies fever, chills, but has felt mildly fatigued. She denies headache, earaches, tinnitus, sore throat, cough, shortness of breath, chest pain, dyspnea, diaphoresis, but has felt some palpitations. She denies abdominal pain, diarrhea, constipation, melena or hematochezia. She denies dysuria, but has felt some frequency.  When seen in the emergency department she was in no acute distress and denied any other acute symptoms.   Review of Systems:  Constitutional:  No weight loss, night sweats, Fevers, chills, fatigue.    HEENT:  No headaches, Difficulty swallowing,Tooth/dental problems,Sore throat,  No sneezing, itching, ear ache, tinnitus, nasal congestion, post nasal drip,  Cardio-vascular:  Positive dizziness, palpitations  No chest pain, Orthopnea, PND, swelling in lower extremities, anasarca,  GI:  No heartburn, indigestion, abdominal pain, nausea, vomiting, diarrhea, change in bowel habits, loss of appetite  Resp:  No shortness of breath with exertion or at rest. No excess mucus, no productive cough, No non-productive cough, No coughing up of blood.No change in color of mucus.No wheezing.No chest wall deformity  Skin:  no rash or lesions.  GU:  no dysuria, change in color of urine, no urgency or frequency. No flank pain.  Musculoskeletal:  No joint pain or swelling. No decreased range of motion. No back pain.  Psych:  No change in mood or affect. No depression or anxiety. No memory loss.  Neuro: Syncopal episodes 2. Questionable seizure activity. Past Medical History  Diagnosis Date  . History of chicken pox   . Glaucoma     both eyes  . Hypertension   . Chronic kidney disease 1998    stones  . Stroke (HCC) 04/2007    x 3 total  . Thyroid disease     hypothyroid  . Fibrocystic breast 01/12/2011  . Insomnia 01/13/2011  . Urinary frequency 02/09/2011  . Vertigo 02/09/2011  . Spinal stenosis 06/14/2011  . Nausea 05/31/2012  . Memory loss 10/12/2012  . Depression with anxiety 09/14/2014  . Kidney stone     stones    Past Surgical History  Procedure Laterality Date  . Abdominal hysterectomy  1963  . Back surgery  1989    discectomy L4-5 1989, good results  .  Breast surgery  1970    biopsy, benign, b/l   Social History:  reports that she has never smoked. She has never used smokeless tobacco. She reports that she drinks about 0.6 oz of alcohol per week. She reports that she does not use illicit drugs.  Allergies  Allergen Reactions  . Ciprofloxacin Hives    Given with 2 other  meds/unsure if truly allergic  . Combigan [Brimonidine Tartrate-Timolol] Other (See Comments)    Inflamation  . Detrol [Tolterodine Tartrate] Hives    Given with 2 other medications/unsure if truly allergic  . Oxycodone Hives    Given with 2 other medications/unsure if truly allergic  . Penicillins Hives, Itching and Rash  . Sulfa Antibiotics Hives, Itching and Rash    Family History  Problem Relation Age of Onset  . Heart disease Mother   . Hypertension Mother   . Diabetes Mother   . Heart disease Father   . Hypertension Father   . Cancer Sister     breast cancer  . Mental illness Brother     service connected    Prior to Admission medications   Medication Sig Start Date End Date Taking? Authorizing Provider  acetaminophen (TYLENOL) 325 MG tablet Take 2 tablets (650 mg total) by mouth every 6 (six) hours as needed. 11/21/12  Yes Christiane Ha, MD  amLODipine (NORVASC) 5 MG tablet Take 1 tablet (5 mg total) by mouth daily. 11/14/14  Yes Edward Saguier, PA-C  citalopram (CELEXA) 10 MG tablet Take 1 tablet (10 mg total) by mouth daily. 09/08/14  Yes [name], MD  dipyridamole-aspirin (AGGRENOX) 200-25 MG per 12 hr capsule TAKE 1 CAPSULE BY MOUTH 2 (TWO) TIMES DAILY. 09/30/14  Yes [name], MD  donepezil (ARICEPT) 10 MG tablet Take 1 tablet (10 mg total) by mouth at bedtime. 09/08/14  Yes [name], MD  gabapentin (NEURONTIN) 100 MG capsule TAKE 1 CAPSULE (100 MG TOTAL) BY MOUTH AT BEDTIME. 07/31/14  Yes [name], MD  latanoprost (XALATAN) 0.005 % ophthalmic solution Place 1 drop into both eyes at bedtime. 05/02/14  Yes [name], MD  levothyroxine (SYNTHROID, LEVOTHROID) 75 MCG tablet Take 1 tablet (75 mcg total) by mouth daily. 06/25/14  Yes [name], MD  lisinopril (PRINIVIL,ZESTRIL) 10 MG tablet 1 tab po bid Patient taking differently: Take 10 mg by mouth daily.  11/14/14  Yes Edward Saguier, PA-C  timolol (BETIMOL) 0.5 % ophthalmic solution Place 1  drop into both eyes every morning.     Yes Historical Provider, MD  ondansetron (ZOFRAN) 4 MG tablet TAKE 1 TABLET EVERY 8 HOURS AS NEEDED FOR NAUSEA 11/06/14   [name], MD   Physical Exam: Filed Vitals:   03/08/15 1443 03/08/15 1630 03/08/15 1700 03/08/15 1900  BP: 120/54 119/56 118/52 183/65  Pulse: 58 61 64 71  Temp: 97.5 F (36.4 C)     TempSrc: Oral     Resp: SpO2: 96% 100% 100% 99%    Wt Readings from Last 3 Encounters:  02/12/15 62.324 kg (137 lb 6.4 oz)  12/15/14 60.385 kg (133 lb 2 oz)  11/14/14 60.873 kg (134 lb 3.2 oz)    General:  Appears calm and comfortable Eyes: PERRL, normal lids, irises & conjunctiva ENT: grossly normal hearing, lips & tongue are dry. Neck: no LAD, masses or thyromegaly Cardiovascular: RRR, no m/r/g. No LE edema. Telemetry: SR, no arrhythmias  Respiratory: CTA bilaterally, no w/r/r.  Normal respiratory effort. Abdomen: soft, ntnd Skin: no rash or induration seen on limited exam Musculoskeletal: grossly normal tone BUE/BLE Psychiatric: grossly normal mood and affect, speech fluent and appropriate Neurologic: Baseline 4-1/2 over 5 right-sided hemiparesis, rest of the exam within normal limits.           Labs on Admission:  Basic Metabolic Panel:  Recent Labs Lab 03/08/15 1626  NA 138  K 4.4  CL 102  CO2 28  GLUCOSE 120*  BUN 12  CREATININE 1.11*  CALCIUM 9.6  MG 2.6*   Liver Function Tests:  Recent Labs Lab 03/08/15 1626  AST 33  ALT 19  ALKPHOS 103  BILITOT 1.4*  PROT 8.1  ALBUMIN 4.7   CBC:  Recent Labs Lab 03/08/15 1626  WBC 8.2  NEUTROABS 6.4  HGB 16.0*  HCT 47.6*  MCV 94.6  PLT 259   Radiological Exams on Admission: Ct Head Wo Contrast  03/08/2015   CLINICAL DATA:  Possible seizure. Brief episode of staring/unresponsiveness.  EXAM: CT HEAD WITHOUT CONTRAST  TECHNIQUE: Contiguous axial images were obtained from the base of the skull through the vertex without intravenous contrast.   COMPARISON:  Brain MRI 11/21/2012.  Head CT 11/20/2012.  FINDINGS: There is no evidence of acute cortical infarct, intracranial hemorrhage, mass, midline shift, or extra-axial fluid collection. Moderate cerebral atrophy is unchanged. Periventricular white-matter hypodensities are stable to slightly progressed compared to the prior CT and are nonspecific but compatible with moderate chronic small vessel ischemic disease.  Prior bilateral cataract extraction is noted. The visualized paranasal sinuses and mastoid air cells are clear. Calcified atherosclerosis is noted at the skullbase.  IMPRESSION: 1. No evidence of acute intracranial abnormality. 2. Moderate chronic small vessel ischemic disease and cerebral atrophy.   Electronically Signed   By: Sebastian Ache M.D.   On: 03/08/2015 16:40   US carotid bilateral 02/19/2014 Negative for hemodynamically significant stenosis.   NM Myocar Multi W/Spect W/Wall Motion / EF  10/24/2014     Study Result      The study is normal.  The left ventricular ejection fraction is hyperdynamic (>65%).  This is a low risk study.     EKG: Independently reviewed. Vent. rate 57 BPM PR interval 203 ms QRS duration 82 ms QT/QTc 443/431 ms P-R-T axes 66 20 102 Sinus rhythm Repol abnrm suggests ischemia, lateral leads  Assessment/Plan Principal Problem:     Syncope Admit to telemetry for cardiac monitoring. Serial troponin level measurements. I will defer echocardiogram since the patient had a normal Myoview stress test with ejection fraction less than 5 months ago. Check carotid Doppler.  Active Problems:    UTI (lower urinary tract infection)    Continue IV ceftriaxone. Follow-up urine culture and sensitivity.     Hypertension Continue current antihypertensive therapy with amlodipine and lisinopril. Monitor blood pressure.    Thyroid disease Continue levothyroxine and monitor TSH as an outpatient.    Depression with anxiety Continue Celexa 10  mg by mouth daily.    Glaucoma Continue Xalatan and timolol ophthalmic drops. Regular follow-ups with ophthalmologist as an outpatient.    Dehydration Continue gentle hydration.      Code Status: Full code. DVT Prophylaxis: Lovenox SQ. Family Communication:  Disposition Plan: Admit to telemetry for observation and treat UTI.  Time spent: Over 70 minutes were spent during the process of his admission.  Bobette Mo [facility] Pager [phone].

## 2015-03-09 ENCOUNTER — Observation Stay (HOSPITAL_BASED_OUTPATIENT_CLINIC_OR_DEPARTMENT_OTHER): Payer: Medicare Other

## 2015-03-09 DIAGNOSIS — F418 Other specified anxiety disorders: Secondary | ICD-10-CM | POA: Diagnosis not present

## 2015-03-09 DIAGNOSIS — H409 Unspecified glaucoma: Secondary | ICD-10-CM | POA: Diagnosis not present

## 2015-03-09 DIAGNOSIS — R55 Syncope and collapse: Secondary | ICD-10-CM | POA: Diagnosis not present

## 2015-03-09 DIAGNOSIS — E860 Dehydration: Secondary | ICD-10-CM | POA: Diagnosis not present

## 2015-03-09 LAB — COMPREHENSIVE METABOLIC PANEL
ALK PHOS: 84 U/L (ref 38–126)
ALT: 11 U/L — ABNORMAL LOW (ref 14–54)
AST: 15 U/L (ref 15–41)
Albumin: 3.2 g/dL — ABNORMAL LOW (ref 3.5–5.0)
Anion gap: 8 (ref 5–15)
BILIRUBIN TOTAL: 0.5 mg/dL (ref 0.3–1.2)
BUN: 9 mg/dL (ref 6–20)
CALCIUM: 8.5 mg/dL — AB (ref 8.9–10.3)
CO2: 22 mmol/L (ref 22–32)
Chloride: 108 mmol/L (ref 101–111)
Creatinine, Ser: 0.67 mg/dL (ref 0.44–1.00)
GFR calc Af Amer: >60 mL/min (ref >60)
Glucose, Bld: 101 mg/dL — ABNORMAL HIGH (ref 65–99)
POTASSIUM: 3.2 mmol/L — AB (ref 3.5–5.1)
Sodium: 138 mmol/L (ref 135–145)
TOTAL PROTEIN: 5.4 g/dL — AB (ref 6.5–8.1)

## 2015-03-09 LAB — CBC WITH DIFFERENTIAL/PLATELET
BASOS ABS: 0 10*3/uL (ref 0.0–0.1)
BASOS PCT: 0 %
EOS ABS: 0.1 10*3/uL (ref 0.0–0.7)
EOS PCT: 1 %
HCT: 36.7 % (ref 36.0–46.0)
Hemoglobin: 12.3 g/dL (ref 12.0–15.0)
LYMPHS PCT: 34 %
Lymphs Abs: 2.6 10*3/uL (ref 0.7–4.0)
MCH: 31.7 pg (ref 26.0–34.0)
MCHC: 33.5 g/dL (ref 30.0–36.0)
MCV: 94.6 fL (ref 78.0–100.0)
MONO ABS: 0.8 10*3/uL (ref 0.1–1.0)
Monocytes Relative: 10 %
Neutro Abs: 4.2 10*3/uL (ref 1.7–7.7)
Neutrophils Relative %: 55 %
PLATELETS: 240 10*3/uL (ref 150–400)
RBC: 3.88 MIL/uL (ref 3.87–5.11)
RDW: 13.9 % (ref 11.5–15.5)
WBC: 7.7 10*3/uL (ref 4.0–10.5)

## 2015-03-09 LAB — MRSA PCR SCREENING: MRSA BY PCR: NEGATIVE

## 2015-03-09 LAB — TROPONIN I: Troponin I: <0.03 ng/mL (ref <0.031)

## 2015-03-09 MED ORDER — SODIUM CHLORIDE 0.9 % IV SOLN
INTRAVENOUS | Status: DC
  Administered 2015-03-09 – 2015-03-10 (×3): via INTRAVENOUS

## 2015-03-09 MED ORDER — POTASSIUM CHLORIDE CRYS ER 20 MEQ PO TBCR
40.0000 meq | EXTENDED_RELEASE_TABLET | Freq: Four times a day (QID) | ORAL | Status: AC
  Administered 2015-03-09 (×2): 40 meq via ORAL
  Filled 2015-03-09 (×2): qty 2

## 2015-03-09 NOTE — Progress Notes (Signed)
CSW spoke w patient's family member, daughter in law Bland, who states that family is pleased w patient's care at [facility], no concerns expressed, says family will work out transportation back to facility at discharge with via family member or facility transport.  Please keep family informed on discharge plans.    Santa Genera, LCSW Clinical Social Worker

## 2015-03-09 NOTE — Progress Notes (Signed)
*  PRELIMINARY RESULTS* Vascular Ultrasound Carotid Duplex (Doppler) has been completed.  Preliminary findings: Bilateral: No significant (1-39%) ICA stenosis. Antegrade vertebral flow.    Farrel Demark, RDMS, RVT  03/09/2015, 8:45 AM

## 2015-03-09 NOTE — Clinical Social Work Note (Signed)
Clinical Social Work Assessment  Patient Details  Name: Darlene Wilson MRN: [id number] Date of Birth: Aug 11, 1926  Date of referral:  03/09/15               Reason for consult:  Facility Placement                Permission sought to share information with:  Family Supports Permission granted to share information::  Yes, Verbal Permission Granted  Name::     Son, William Laske; Daughter, Mikella Linsley  Agency::  [facility]  Relationship::     Contact Information:     Housing/Transportation Living arrangements for the past 2 months:  Assisted Dealer of Information:  Patient Patient Interpreter Needed:  None Criminal Activity/Legal Involvement Pertinent to Current Situation/Hospitalization:  No - Comment as needed Significant Relationships:    Lives with:  Facility Resident Do you feel safe going back to the place where you live?  Yes Need for family participation in patient care:  No (Coment)  Care giving concerns:  Currently in ALF, pt states she sometimes feels confused due to history of past stroke   Office manager / plan:  CSW provided support and encouragement to patient, patient used to being independent in ALF, gardens and visits w neighbors.  Reaches out to son and daughter when feeling isolated, son lives in [place], daughter lives in [place].  Widowed since 2007, retired Retail banker and Education officer, environmental.  Used to being independent and self sufficient, "I promised my husband I would never give up."  Aware that history of past strokes has left some memory impairment, but determined to continue to live as independently as possible.  Plans to return to [facility] when medically ready.  Employment status:  Retired Health and safety inspector:  Medicare PT Recommendations:  Not assessed at this time Information / Referral to community resources:  Other (Comment Required) (no information needed, return to ALF)  Patient/Family's Response  to care:  Somewhat tense and frustrated at loss of function due to current illness.  Anxious to go home.    Patient/Family's Understanding of and Emotional Response to Diagnosis, Current Treatment, and Prognosis:  Frustrated at confinement in hospital, able to regain composure when talking about familiar activities at home, states she manages tension/distress as outpatient by talking w family members on phone.  Willing to do so as needed while inpatient.    Emotional Assessment Appearance:  Appears stated age Attitude/Demeanor/Rapport:   (calm and cooperative) Affect (typically observed):  Calm, Appropriate Orientation:  Oriented to Self, Oriented to Place, Oriented to  Time, Oriented to Situation Alcohol / Substance use:  Never Used Psych involvement (Current and /or in the community):  No (Comment)  Discharge Needs  Concerns to be addressed:  Other (Comment Required (return to ALF at discharge) Readmission within the last 30 days:  No Current discharge risk:  None Barriers to Discharge:  Continued Medical Work up   Sallee Lange, LCSW 03/09/2015, 3:16 PM

## 2015-03-09 NOTE — Progress Notes (Signed)
[facility] PROGRESS NOTE   CLYDELL ALBERTS QIO:[id number] DOB: [birth year] DOA: 03/08/2015 PCP: Danise Edge, MD  HPI/Subjective: Denies any complaints, no more syncopal episodes while in the hospital. Denies any chest pain or any type of prodromal symptoms prior to the fall.  Assessment/Plan: Principal Problem:   Syncope Active Problems:   Hypertension   Thyroid disease   Depression with anxiety   Glaucoma   Dehydration   UTI (lower urinary tract infection)   Syncope Fell while she was walking, loss of consciousness. Does not be appear orthostatic as she was walking for some time. Serial troponin level measurements. I will defer echocardiogram since the patient had a normal Myoview stress test with ejection fraction less than 5 months ago. Check carotid Doppler pending. Her syncope is likely secondary to UTI and generalized weakness from that   UTI (lower urinary tract infection)  Started on Rocephin in the emergency department Adjust antibiotics according to the culture and sensitivity.   Hypertension Continue current antihypertensive therapy with amlodipine and lisinopril. Monitor blood pressure.   Thyroid disease Continue levothyroxine and monitor TSH as an outpatient.   Depression with anxiety Continue Celexa 10 mg by mouth daily.   Glaucoma Continue Xalatan and timolol ophthalmic drops. Regular follow-ups with ophthalmologist as an outpatient.   Dehydration Continue gentle hydration.   Code Status: Full Code Family Communication: Plan discussed with the patient. Disposition Plan: Remains inpatient Diet: Diet Heart Room service appropriate?: Yes; Fluid consistency:: Thin  Consultants:  Stable  Procedures:  None  Antibiotics:  None   Objective: Filed Vitals:   03/09/15 1336  BP: 142/65  Pulse: 82  Temp: 98.4 F (36.9 C)  Resp: 21    Intake/Output Summary (Last 24 hours) at 03/09/15 1451 Last data filed at 03/09/15  1415  Gross per 24 hour  Intake 1491.67 ml  Output   2335 ml  Net -843.33 ml   Filed Weights   03/08/15 2053  Weight: 58 kg (127 lb 13.9 oz)    Exam: General: Alert and awake, oriented x3, not in any acute distress. HEENT: anicteric sclera, pupils reactive to light and accommodation, EOMI CVS: S1-S2 clear, no murmur rubs or gallops Chest: clear to auscultation bilaterally, no wheezing, rales or rhonchi Abdomen: soft nontender, nondistended, normal bowel sounds, no organomegaly Extremities: no cyanosis, clubbing or edema noted bilaterally Neuro: Cranial nerves II-XII intact, no focal neurological deficits  Data Reviewed: Basic Metabolic Panel:  Recent Labs Lab 03/08/15 1626 03/08/15 1645 03/09/15 0440  NA 138  --  138  K 4.4  --  3.2*  CL 102  --  108  CO2 28  --  22  GLUCOSE 120*  --  101*  BUN 12  --  9  CREATININE 1.11*  --  0.67  CALCIUM 9.6  --  8.5*  MG 2.6*  --   --   PHOS  --  4.4  --    Liver Function Tests:  Recent Labs Lab 03/08/15 1626 03/09/15 0440  AST 33 15  ALT 19 11*  ALKPHOS 103 84  BILITOT 1.4* 0.5  PROT 8.1 5.4*  ALBUMIN 4.7 3.2*   No results for input(s): LIPASE, AMYLASE in the last 168 hours. No results for input(s): AMMONIA in the last 168 hours. CBC:  Recent Labs Lab 03/08/15 1626 03/09/15 0440  WBC 8.2 7.7  NEUTROABS 6.4 4.2  HGB 16.0* 12.3  HCT 47.6* 36.7  MCV 94.6 94.6  PLT 259 240   Cardiac Enzymes:  Recent  Labs Lab 03/09/15 0440  TROPONINI <0.03   BNP (last 3 results) No results for input(s): BNP in the last 8760 hours.  ProBNP (last 3 results) No results for input(s): PROBNP in the last 8760 hours.  CBG: No results for input(s): GLUCAP in the last 168 hours.  Micro Recent Results (from the past 240 hour(s))  Urine culture     Status: None (Preliminary result)   Collection Time: 03/08/15  3:16 PM  Result Value Ref Range Status   Specimen Description URINE, CLEAN CATCH  Final   Special Requests NONE   Final   Culture   Final    CULTURE REINCUBATED FOR BETTER GROWTH Performed at [facility]    Report Status PENDING  Incomplete  MRSA PCR Screening     Status: None   Collection Time: 03/08/15  9:20 PM  Result Value Ref Range Status   MRSA by PCR NEGATIVE NEGATIVE Final    Comment:        The GeneXpert MRSA Assay (FDA approved for NASAL specimens only), is one component of a comprehensive MRSA colonization surveillance program. It is not intended to diagnose MRSA infection nor to guide or monitor treatment for MRSA infections.      Studies: Ct Head Wo Contrast  03/08/2015   CLINICAL DATA:  Possible seizure. Brief episode of staring/unresponsiveness.  EXAM: CT HEAD WITHOUT CONTRAST  TECHNIQUE: Contiguous axial images were obtained from the base of the skull through the vertex without intravenous contrast.  COMPARISON:  Brain MRI 11/21/2012.  Head CT 11/20/2012.  FINDINGS: There is no evidence of acute cortical infarct, intracranial hemorrhage, mass, midline shift, or extra-axial fluid collection. Moderate cerebral atrophy is unchanged. Periventricular white-matter hypodensities are stable to slightly progressed compared to the prior CT and are nonspecific but compatible with moderate chronic small vessel ischemic disease.  Prior bilateral cataract extraction is noted. The visualized paranasal sinuses and mastoid air cells are clear. Calcified atherosclerosis is noted at the skullbase.  IMPRESSION: 1. No evidence of acute intracranial abnormality. 2. Moderate chronic small vessel ischemic disease and cerebral atrophy.   Electronically Signed   By: Sebastian Ache M.D.   On: 03/08/2015 16:40    Scheduled Meds: . amLODipine  5 mg Oral Daily  . cefTRIAXone (ROCEPHIN)  IV  1 g Intravenous Q24H  . citalopram  10 mg Oral Daily  . dipyridamole-aspirin  1 capsule Oral BID  . donepezil  10 mg Oral QHS  . enoxaparin (LOVENOX) injection  40 mg Subcutaneous QHS  . gabapentin  100 mg Oral QHS   . latanoprost  1 drop Both Eyes QHS  . levothyroxine  75 mcg Oral Daily  . lisinopril  10 mg Oral Daily  . potassium chloride  40 mEq Oral Q6H  . sodium chloride  3 mL Intravenous Q12H  . timolol  1 drop Both Eyes Daily   Continuous Infusions:      Time spent: 35 minutes    [facility]  [facility] Pager [phone] If 7PM-7AM, please contact night-coverage at [email or web address], password [facility] 03/09/2015, 2:51 PM

## 2015-03-10 ENCOUNTER — Observation Stay (HOSPITAL_COMMUNITY): Payer: Medicare Other

## 2015-03-10 DIAGNOSIS — G9340 Encephalopathy, unspecified: Secondary | ICD-10-CM | POA: Diagnosis present

## 2015-03-10 DIAGNOSIS — Z8249 Family history of ischemic heart disease and other diseases of the circulatory system: Secondary | ICD-10-CM | POA: Diagnosis not present

## 2015-03-10 DIAGNOSIS — Z833 Family history of diabetes mellitus: Secondary | ICD-10-CM | POA: Diagnosis not present

## 2015-03-10 DIAGNOSIS — E876 Hypokalemia: Secondary | ICD-10-CM | POA: Diagnosis present

## 2015-03-10 DIAGNOSIS — R55 Syncope and collapse: Secondary | ICD-10-CM | POA: Diagnosis not present

## 2015-03-10 DIAGNOSIS — I129 Hypertensive chronic kidney disease with stage 1 through stage 4 chronic kidney disease, or unspecified chronic kidney disease: Secondary | ICD-10-CM | POA: Diagnosis present

## 2015-03-10 DIAGNOSIS — Z8673 Personal history of transient ischemic attack (TIA), and cerebral infarction without residual deficits: Secondary | ICD-10-CM | POA: Diagnosis not present

## 2015-03-10 DIAGNOSIS — N179 Acute kidney failure, unspecified: Secondary | ICD-10-CM

## 2015-03-10 DIAGNOSIS — F0390 Unspecified dementia without behavioral disturbance: Secondary | ICD-10-CM | POA: Diagnosis present

## 2015-03-10 DIAGNOSIS — N189 Chronic kidney disease, unspecified: Secondary | ICD-10-CM | POA: Diagnosis present

## 2015-03-10 DIAGNOSIS — E440 Moderate protein-calorie malnutrition: Secondary | ICD-10-CM | POA: Insufficient documentation

## 2015-03-10 DIAGNOSIS — E860 Dehydration: Secondary | ICD-10-CM | POA: Diagnosis present

## 2015-03-10 DIAGNOSIS — E039 Hypothyroidism, unspecified: Secondary | ICD-10-CM | POA: Diagnosis present

## 2015-03-10 DIAGNOSIS — I10 Essential (primary) hypertension: Secondary | ICD-10-CM

## 2015-03-10 DIAGNOSIS — F418 Other specified anxiety disorders: Secondary | ICD-10-CM | POA: Diagnosis not present

## 2015-03-10 DIAGNOSIS — N390 Urinary tract infection, site not specified: Principal | ICD-10-CM

## 2015-03-10 DIAGNOSIS — E038 Other specified hypothyroidism: Secondary | ICD-10-CM

## 2015-03-10 DIAGNOSIS — Z6821 Body mass index (BMI) 21.0-21.9, adult: Secondary | ICD-10-CM | POA: Diagnosis not present

## 2015-03-10 DIAGNOSIS — H409 Unspecified glaucoma: Secondary | ICD-10-CM | POA: Diagnosis present

## 2015-03-10 DIAGNOSIS — Z803 Family history of malignant neoplasm of breast: Secondary | ICD-10-CM | POA: Diagnosis not present

## 2015-03-10 DIAGNOSIS — R413 Other amnesia: Secondary | ICD-10-CM

## 2015-03-10 LAB — URINE CULTURE

## 2015-03-10 MED ORDER — POTASSIUM CHLORIDE CRYS ER 20 MEQ PO TBCR
40.0000 meq | EXTENDED_RELEASE_TABLET | Freq: Once | ORAL | Status: AC
  Administered 2015-03-10: 40 meq via ORAL
  Filled 2015-03-10: qty 2

## 2015-03-10 NOTE — Progress Notes (Addendum)
Patient ID: Darlene Wilson, female   DOB: 1926/06/13, 81 y.o.   MRN: [id number] [facility] PROGRESS NOTE  TYSHIKA BALDRIDGE NWG:[id number] DOB: Jul 28, 1926 DOA: 07-Apr-2015 PCP: Danise Edge, MD  Brief narrative:    81 y.o. female with a past medical history of hypertension, depression, hypothyroidism who presented to [facility] ED with 2 episodes of syncope the day prior to this admission. Pt fell lightheaded and had nausea prior to los of consciousness.   Pt was hemodynamically stable on admission. Troponin level ws WNL. UA showed large leukocytes and many bacteria. She was started on rocephin. Physical therapy evaluation obtained.   Anticipated discharge: Obtain PT eval. Anticipated D/C 10/12.   Assessment/Plan:    Principal problem: Syncope / acute encephalopathy - Possibly secondary to urinary tract infection. - Troponin level was within normal limits. - Obtain 2-D echo. Carotid Doppler study is pending. - Obtain physical therapy evaluation.  Active problems: UTI (lower urinary tract infection)  - Urinalysis on the admission showed large leukocytes with many bacteria.  - Patient started on empiric Rocephin - Follow up urine culture results  Anxiety and depression - Continue Celexa  Hypothyroidism - Continue Synthroid 75 g daily  Essential hypertension - Continue Norvasc and lisinopril  Hypokalemia - Unclear etiology. We will supplement.  Acute renal failure - Likely from urinary tract infection - Creatinine is now within normal limits. - Continue to monitor renal function since patient is also on lisinopril.  Memory loss - Continue Aricept  Malnutrition of moderate degree - Nutrition consulted     DVT Prophylaxis  - Lovenox subQ ordered    Code Status: Full.  Family Communication:  plan of care discussed with the patient Disposition Plan: Needs PT eval. Anticipate D/C by 03/11/2015.   IV access:  Peripheral IV  Procedures and diagnostic studies:     Ct Head Wo Contrast 04/07/15  1. No evidence of acute intracranial abnormality. 2. Moderate chronic small vessel ischemic disease and cerebral atrophy.     Medical Consultants:  None   Other Consultants:  Physical therapy Nutrition  IAnti-Infectives:   Rocephin 03/09/2015 -->    Manson Passey, MD  [facility] Pager [phone]  Time spent in minutes: 25 minutes  If 7PM-7AM, please contact night-coverage [email or web address] Password [email or web address] 03/10/2015, 11:18 AM      HPI/Subjective: No acute overnight events. Patient reports confusion intermittently.  Objective: Filed Vitals:   03/09/15 0645 03/09/15 1336 03/09/15 2037 03/10/15 0649  BP: 138/56 142/65 146/75 161/86  Pulse: 67 82 80 80  Temp: 98.1 F (36.7 C) 98.4 F (36.9 C) 98.2 F (36.8 C) 97.7 F (36.5 C)  TempSrc: Oral Oral Oral Oral  Resp: Height:      Weight:      SpO2: 99% 97% 98% 98%    Intake/Output Summary (Last 24 hours) at 03/10/15 1118 Last data filed at 03/10/15 2952  Gross per 24 hour  Intake 1411.25 ml  Output   2925 ml  Net -1513.75 ml    Exam:   General:  Pt is alert, follows commands appropriately, not in acute distress  Cardiovascular: Regular rate and rhythm, S1/S2 appreciated   Respiratory: Clear to auscultation bilaterally, no wheezing, no crackles, no rhonchi  Abdomen: Soft, non tender, non distended, bowel sounds present  Extremities: No edema, pulses DP and PT palpable bilaterally  Neuro: Grossly nonfocal  Data Reviewed: Basic Metabolic Panel:  Recent Labs Lab 2015/04/07 1626 April 07, 2015 1645 03/09/15 0440  NA 138  --  138  K 4.4  --  3.2*  CL 102  --  108  CO2 28  --  22  GLUCOSE 120*  --  101*  BUN 12  --  9  CREATININE 1.11*  --  0.67  CALCIUM 9.6  --  8.5*  MG 2.6*  --   --   PHOS  --  4.4  --    Liver Function Tests:  Recent Labs Lab 03/08/15 1626 03/09/15 0440  AST 33 15  ALT 19 11*  ALKPHOS 103 84  BILITOT 1.4* 0.5  PROT 8.1 5.4*   ALBUMIN 4.7 3.2*   No results for input(s): LIPASE, AMYLASE in the last 168 hours. No results for input(s): AMMONIA in the last 168 hours. CBC:  Recent Labs Lab 03/08/15 1626 03/09/15 0440  WBC 8.2 7.7  NEUTROABS 6.4 4.2  HGB 16.0* 12.3  HCT 47.6* 36.7  MCV 94.6 94.6  PLT 259 240   Cardiac Enzymes:  Recent Labs Lab 03/09/15 0440  TROPONINI <0.03   BNP: Invalid input(s): POCBNP CBG: No results for input(s): GLUCAP in the last 168 hours.  Recent Results (from the past 240 hour(s))  Urine culture     Status: None (Preliminary result)   Collection Time: 03/08/15  3:16 PM  Result Value Ref Range Status   Specimen Description URINE, CLEAN CATCH  Final   Special Requests NONE  Final   Culture   Final    CULTURE REINCUBATED FOR BETTER GROWTH Performed at [facility]    Report Status PENDING  Incomplete  MRSA PCR Screening     Status: None   Collection Time: 03/08/15  9:20 PM  Result Value Ref Range Status   MRSA by PCR NEGATIVE NEGATIVE Final     Scheduled Meds: . amLODipine  5 mg Oral Daily  . cefTRIAXone   1 g Intravenous Q24H  . citalopram  10 mg Oral Daily  . dipyridamole-aspirin  1 capsule Oral BID  . donepezil  10 mg Oral QHS  . enoxaparin (LOVENOX)  40 mg Subcutaneous QHS  . gabapentin  100 mg Oral QHS  . latanoprost  1 drop Both Eyes QHS  . levothyroxine  75 mcg Oral Daily  . lisinopril  10 mg Oral Daily   Continuous Infusions: . sodium chloride 75 mL/hr at 03/10/15 0705

## 2015-03-10 NOTE — Progress Notes (Signed)
Initial Nutrition Assessment  DOCUMENTATION CODES:   Non-severe (moderate) malnutrition in context of acute illness/injury  INTERVENTION:  - RD will continue to monitor for needs  NUTRITION DIAGNOSIS:   Unintentional weight loss related to acute illness, lethargy/confusion as evidenced by percent weight loss.  GOAL:   Patient will meet greater than or equal to 90% of their needs  MONITOR:   PO intake, Weight trends, Labs, I & O's  REASON FOR ASSESSMENT:   Consult Assessment of nutrition requirement/status  ASSESSMENT:   81 y.o. female with a past medical history of glaucoma, hypertension, CVA 3, hypothyroidism, spinal stenosis, depression with anxiety was brought to the emergency department after having 2 episodes of loss of consciousness while shopping at a [employer]. Per patient's son, she was walking in the store with her daughter-in-law and another person, when she started feeling nauseous and lightheaded. She lost her strength and balance and was helped by them throughout her to fall and was placed on the floor. Her loss of consciousness lasted for about 2-3 minutes, there was no tonic-clonic movements, tongue biting, sphincter incontinence or postictal period. Her son states, that shortly after that she had a second episode that lasted for about 30 seconds or so, during this episode she moves her right upper extremity, but again there was no other indications of this being a seizure. The patient does not have any recollection of the events, but she has mild dementia with short-term memory impairment.  Pt seen for consult. BMI indicates normal weight status. Pt with hx of mild dementia and son had reported that pt's baseline is that she is alert to self. Spoke with pt after she had finished lunch (100% completion of sandwich and jello). Pt reports she ate well at breakfast also. Per chart review, she ate 75% of breakfast and lunch yesterday.   Per chart review, pt has lost 10  lbs (7% body weight) in the past 1 month which is significant for time frame. Unsure if this is fluid-related or loss of lean body mass related. Mild and moderate muscle wasting to upper body noted during physical assessment.   Per notes, pt may return to facility tomorrow (10/12). Will continue to monitor for needs until d/c. Likely meeting needs at this time. Medications reviewed. Labs reviewed; K: 3.2 mmol/L, Ca: 8.5 mg/dL.   Diet Order:  Diet Heart Room service appropriate?: Yes; Fluid consistency:: Thin  Skin:  Reviewed, no issues  Last BM:  10/10  Height:   Ht Readings from Last 1 Encounters:  03/08/15  (1.651 m)    Weight:   Wt Readings from Last 1 Encounters:  03/08/15 127 lb 13.9 oz (58 kg)    Ideal Body Weight:  56.82 kg (kg)  BMI:  Body mass index is 21.28 kg/(m^2).  Estimated Nutritional Needs:   Kcal:  1610-9604  Protein:  558-65 grams  Fluid:  1.8-2 L/day  EDUCATION NEEDS:   No education needs identified at this time      Trenton Gammon, RD, LDN Inpatient Clinical Dietitian Pager # [phone] After hours/weekend pager # [phone]

## 2015-03-10 NOTE — Progress Notes (Signed)
Echocardiogram 2D Echocardiogram has been performed.  Dorothey Baseman 03/10/2015, 1:34 PM

## 2015-03-10 NOTE — Care Management Note (Signed)
Case Management Note  Patient Details  Name: MARIABELLA NILSEN MRN: [id number] Date of Birth: 07/31/1926  Subjective/Objective:   25 y/of admitted w/Syncope.From [place].Spoke to [facility] have [facility] facility.Await PT cons, & recc.                 Action/Plan:d/c plan return back to ALF   Expected Discharge Date:                  Expected Discharge Plan:  Home w Home Health Services  In-House Referral:  Clinical Social Work  Discharge planning Services  CM Consult  Post Acute Care Choice:    Choice offered to:  Patient  DME Arranged:    DME Agency:     HH Arranged:    HH Agency:     Status of Service:  In process, will continue to follow  Medicare Important Message Given:    Date Medicare IM Given:    Medicare IM give by:    Date Additional Medicare IM Given:    Additional Medicare Important Message give by:     If discussed at Long Length of Stay Meetings, dates discussed:    Additional Comments:  Lanier Clam, RN 03/10/2015, 3:16 PM

## 2015-03-11 DIAGNOSIS — E876 Hypokalemia: Secondary | ICD-10-CM

## 2015-03-11 DIAGNOSIS — E440 Moderate protein-calorie malnutrition: Secondary | ICD-10-CM

## 2015-03-11 LAB — BASIC METABOLIC PANEL
Anion gap: 6 (ref 5–15)
BUN: 11 mg/dL (ref 6–20)
CALCIUM: 9.1 mg/dL (ref 8.9–10.3)
CHLORIDE: 104 mmol/L (ref 101–111)
CO2: 26 mmol/L (ref 22–32)
CREATININE: 0.58 mg/dL (ref 0.44–1.00)
GFR calc Af Amer: >60 mL/min (ref >60)
GFR calc non Af Amer: >60 mL/min (ref >60)
GLUCOSE: 96 mg/dL (ref 65–99)
Potassium: 3.8 mmol/L (ref 3.5–5.1)
Sodium: 136 mmol/L (ref 135–145)

## 2015-03-11 LAB — CBC
HCT: 39.6 % (ref 36.0–46.0)
Hemoglobin: 12.9 g/dL (ref 12.0–15.0)
MCH: 30.8 pg (ref 26.0–34.0)
MCHC: 32.6 g/dL (ref 30.0–36.0)
MCV: 94.5 fL (ref 78.0–100.0)
PLATELETS: 243 10*3/uL (ref 150–400)
RBC: 4.19 MIL/uL (ref 3.87–5.11)
RDW: 14 % (ref 11.5–15.5)
WBC: 7 10*3/uL (ref 4.0–10.5)

## 2015-03-11 MED ORDER — NITROFURANTOIN MONOHYD MACRO 100 MG PO CAPS: 100.0000 mg | ORAL_CAPSULE | Freq: Two times a day (BID) | ORAL | Status: DC

## 2015-03-11 NOTE — Evaluation (Signed)
Physical Therapy Evaluation Patient Details Name: Darlene Wilson MRN: [id number] DOB: 1927/03/01 Today's Date: 03/11/2015   History of Present Illness  81 y.o. female with a past medical history of hypertension, depression, hypothyroidism, CVA, spinal stenosis, memory loss admitted for syncope.  Clinical Impression  Pt admitted with above diagnosis. Pt currently with functional limitations due to the deficits listed below (see PT Problem List). Pt ambulated short distance in hallway which was limited by fatigue and generalized weakness.  Also recommended pt use RW upon d/c instead of usual SPC for more stability.  Pt from ALF however reports mostly modified independent and may need a little more assist upon d/c.  If ALF unable to provide current assist level, pt may need SNF. Pt will benefit from skilled PT to increase their independence and safety with mobility to allow discharge to the venue listed below.       Follow Up Recommendations Home health PT;Supervision/Assistance - 24 hour    Equipment Recommendations  None recommended by PT    Recommendations for Other Services       Precautions / Restrictions Precautions Precautions: Fall Restrictions Weight Bearing Restrictions: No      Mobility  Bed Mobility Overal bed mobility: Needs Assistance Bed Mobility: Supine to Sit     Supine to sit: Min guard;HOB elevated        Transfers Overall transfer level: Needs assistance Equipment used: Rolling walker (2 wheeled) Transfers: Sit to/from Stand Sit to Stand: Min assist         General transfer comment: assist to steady with standing, assist to rise and steady from low toilet  Ambulation/Gait Ambulation/Gait assistance: Min assist Ambulation Distance (Feet): 100 Feet Assistive device: Rolling walker (2 wheeled) Gait Pattern/deviations: Step-through pattern;Trunk flexed     General Gait Details: verbal cues for safe use of RW, pt reports feeling a little "woozy"  with ambulation however did not become worse, pt c/o fatigue and weakness  Stairs            Wheelchair Mobility    Modified Rankin (Stroke Patients Only)       Balance Overall balance assessment: History of Falls                                           Pertinent Vitals/Pain Pain Assessment: Faces Faces Pain Scale: Hurts little more Pain Location: headache Pain Descriptors / Indicators: Aching Pain Intervention(s): Limited activity within patient's tolerance;Monitored during session;Repositioned;Premedicated before session    Home Living Family/patient expects to be discharged to:: Assisted living               Home Equipment: Walker - 2 wheels;Cane - single point      Prior Function Level of Independence: Independent with assistive device(s)         Comments: uses SPC, ambulates to dining hall     Hand Dominance        Extremity/Trunk Assessment   Upper Extremity Assessment: Generalized weakness           Lower Extremity Assessment: Generalized weakness      Cervical / Trunk Assessment: Kyphotic  Communication   Communication: No difficulties  Cognition Arousal/Alertness: Awake/alert Behavior During Therapy: WFL for tasks assessed/performed Overall Cognitive Status: No family/caregiver present to determine baseline cognitive functioning (hx of memory loss, appropriate during session)  General Comments      Exercises        Assessment/Plan    PT Assessment Patient needs continued PT services  PT Diagnosis Difficulty walking;Generalized weakness   PT Problem List Decreased strength;Decreased activity tolerance;Decreased balance;Decreased mobility;Decreased knowledge of use of DME  PT Treatment Interventions DME instruction;Functional mobility training;Patient/family education;Gait training;Therapeutic activities;Therapeutic exercise   PT Goals (Current goals can be found in the Care  Plan section) Acute Rehab PT Goals PT Goal Formulation: With patient Time For Goal Achievement: 03/18/15 Potential to Achieve Goals: Good    Frequency Min 3X/week   Barriers to discharge        Co-evaluation               End of Session Equipment Utilized During Treatment: Gait belt Activity Tolerance: Patient limited by fatigue Patient left: in chair;with call bell/phone within reach;with chair alarm set Nurse Communication: Mobility status         Time: [id number] PT Time Calculation (min) (ACUTE ONLY): 16 min   Charges:   PT Evaluation $Initial PT Evaluation Tier I: 1 Procedure     PT G Codes:        Darlene Wilson,Darlene Wilson 03/11/2015, 12:01 PM Darlene JarredKati Corderro Koloski, PT, DPT 03/11/2015 Pager: [phone]

## 2015-03-11 NOTE — Care Management Note (Signed)
Case Management Note  Patient Details  Name: Darlene Wilson MRN: [id number] Date of Birth: 1927-04-05  Subjective/Objective: HHC orders for HHRN/PT/OT/aide-[facility]-spoke to Angela-faxed HHC orders w/confirmation.TC [facility] rep Tim aware of HHC orders, & d/c.                   Action/Plan:[facility].   Expected Discharge Date:                  Expected Discharge Plan:  Home w Home Health Services  In-House Referral:  Clinical Social Work  Discharge planning Services  CM Consult  Post Acute Care Choice:    Choice offered to:  Patient  DME Arranged:    DME Agency:     HH Arranged:  PT, RN, OT, Nurse's Aide [facility] HHPT)  Status of Service:  Completed, signed off  Medicare Important Message Given:    Date Medicare IM Given:    Medicare IM give by:    Date Additional Medicare IM Given:    Additional Medicare Important Message give by:     If discussed at Long Length of Stay Meetings, dates discussed:    Additional Comments:  Lanier ClamMahabir, Irais Mottram, RN 03/11/2015, 2:22 PM

## 2015-03-11 NOTE — Progress Notes (Signed)
[facility] for [facility] today [facility]. Daughter, [name] and Marylene Landngela at ALF agreeable to this plan. Daughter to provide transport by car.  Reece LevyJanet Faiga Stones, MSW, Theresia MajorsLCSWA [phone]

## 2015-03-11 NOTE — Progress Notes (Signed)
[facility]. Pt's daughter is taking pt to location from hospital. Educated pt and daughter on UTI condition, medication, and expectations before discharge. Pt and daughter verbalized understanding of education. IV removed. Pt's information packet and prescription was given to [facility].

## 2015-03-11 NOTE — Discharge Summary (Signed)
Physician Discharge Summary  Darlene Wilson ZOX:[id number] DOB: 12/20/1926 DOA: 03/08/2015  PCP: Danise Edge, MD  Admit date: 03/08/2015 Discharge date: 03/11/2015  Recommendations for Outpatient Follow-up:  1. Pt will continue Macrobid for 4 days on discharge for UTI  Discharge Diagnoses:  Principal Problem:   Syncope Active Problems:   Hypertension   Thyroid disease   Depression with anxiety   Glaucoma   Dehydration   UTI (lower urinary tract infection)   Malnutrition of moderate degree    Discharge Condition: stable   Diet recommendation: as tolerated   History of present illness:  81 y.o. female with a past medical history of hypertension, depression, hypothyroidism who presented to [facility] ED with 2 episodes of syncope the day prior to this admission. Pt fell lightheaded and had nausea prior to los of consciousness.   Pt was hemodynamically stable on admission. Troponin level ws WNL. UA showed large leukocytes and many bacteria. She was started on rocephin. Physical therapy evaluation obtained.   Hospital Course:   Assessment/Plan:    Principal problem: Syncope / acute encephalopathy - Secondary to urinary tract infection. - Troponin level WNL  - 2 D ECHO showed normal EF with grade 1 diastolic dysfunction - Carotid doppler showed patent vertebral arteries. - Pt seen by physical therapy  Active problems: UTI (lower urinary tract infection)  - Urinalysis on the admission showed large leukocytes with many bacteria.  - Urine culture with multiple morphologies, non predominant - Will continue Macrobid for 4 days on discharge - Pt received 2 days of rocephin in hospital   Anxiety and depression - Continue Celexa on discharge   Hypothyroidism - Continue Synthroid 75 g daily on discharge   Essential hypertension - Continue Norvasc and lisinopril  Hypokalemia - Unclear etiology. Supplemented and WNL.  Acute renal failure - Secondary to urinary tract  infection - Renal function normalized with IV fluids   Memory loss - Continue Aricept on discharge   Malnutrition of moderate degree - Seen by dietician - Diet as tolerated    DVT Prophylaxis  - Lovenox subQ ordered while pt in hospital    Code Status: Full.  Family Communication: plan of care discussed with the patient   IV access:  Peripheral IV  Procedures and diagnostic studies:   Ct Head Wo Contrast 03/08/2015 1. No evidence of acute intracranial abnormality. 2. Moderate chronic small vessel ischemic disease and cerebral atrophy.   Medical Consultants:  None   Other Consultants:  Physical therapy Nutrition  IAnti-Infectives:   Rocephin 03/09/2015 --> 03/11/2015   Signed:  Manson Passey, MD  [facility] 03/11/2015, 8:52 AM  Pager #: [phone]  Time spent in minutes: more than 30 minutes   Discharge Exam: Filed Vitals:   03/11/15 0438  BP: 143/59  Pulse: 63  Temp: 97.7 F (36.5 C)  Resp: 20   Filed Vitals:   03/10/15 0649 03/10/15 1408 03/10/15 2006 03/11/15 0438  BP: 161/86 134/66 155/80 143/59  Pulse: 80 66 71 63  Temp: 97.7 F (36.5 C) 98.3 F (36.8 C) 98.1 F (36.7 C) 97.7 F (36.5 C)  TempSrc: Oral Oral Oral Oral  Resp: Height:      Weight:      SpO2: 98% 98% 98% 99%    General: Pt is alert, follows commands appropriately, not in acute distress Cardiovascular: Regular rate and rhythm, S1/S2 + Respiratory: Clear to auscultation bilaterally, no wheezing, no crackles, no rhonchi Abdominal: Soft, non tender, non distended, bowel  sounds +, no guarding Extremities: no edema, no cyanosis, pulses palpable bilaterally DP and PT Neuro: Grossly nonfocal  Discharge Instructions  Discharge Instructions    Call MD for:  difficulty breathing, headache or visual disturbances    Complete by:  As directed      Call MD for:  persistant dizziness or light-headedness    Complete by:  As directed       Call MD for:  persistant nausea and vomiting    Complete by:  As directed      Call MD for:  severe uncontrolled pain    Complete by:  As directed      Diet - low sodium heart healthy    Complete by:  As directed      Discharge instructions    Complete by:  As directed   Take Macrobid for 4 more days on discharge for UTI     Increase activity slowly    Complete by:  As directed             Medication List    TAKE these medications        acetaminophen 325 MG tablet  Commonly known as:  TYLENOL  Take 2 tablets (650 mg total) by mouth every 6 (six) hours as needed.     amLODipine 5 MG tablet  Commonly known as:  NORVASC  Take 1 tablet (5 mg total) by mouth daily.     citalopram 10 MG tablet  Commonly known as:  CELEXA  Take 1 tablet (10 mg total) by mouth daily.     dipyridamole-aspirin 200-25 MG 12hr capsule  Commonly known as:  AGGRENOX  TAKE 1 CAPSULE BY MOUTH 2 (TWO) TIMES DAILY.     donepezil 10 MG tablet  Commonly known as:  ARICEPT  Take 1 tablet (10 mg total) by mouth at bedtime.     gabapentin 100 MG capsule  Commonly known as:  NEURONTIN  TAKE 1 CAPSULE (100 MG TOTAL) BY MOUTH AT BEDTIME.     latanoprost 0.005 % ophthalmic solution  Commonly known as:  XALATAN  Place 1 drop into both eyes at bedtime.     levothyroxine 75 MCG tablet  Commonly known as:  SYNTHROID, LEVOTHROID  Take 1 tablet (75 mcg total) by mouth daily.     lisinopril 10 MG tablet  Commonly known as:  PRINIVIL,ZESTRIL  1 tab po bid     nitrofurantoin (macrocrystal-monohydrate) 100 MG capsule  Commonly known as:  MACROBID  Take 1 capsule (100 mg total) by mouth 2 (two) times daily.     ondansetron 4 MG tablet  Commonly known as:  ZOFRAN  TAKE 1 TABLET EVERY 8 HOURS AS NEEDED FOR NAUSEA     timolol 0.5 % ophthalmic solution  Commonly known as:  BETIMOL  Place 1 drop into both eyes every morning.           Follow-up Information    Follow up with Danise EdgeBLYTH, STACEY, MD. Schedule  an appointment as soon as possible for a visit in 1 week.   Specialty:  Family Medicine   Why:  Follow up appt after recent hospitalization   Contact information:   [address] [place] [id number] [phone]        The results of significant diagnostics from this hospitalization (including imaging, microbiology, ancillary and laboratory) are listed below for reference.    Significant Diagnostic Studies: Ct Head Wo Contrast  03/08/2015  CLINICAL DATA:  Possible  seizure. Brief episode of staring/unresponsiveness. EXAM: CT HEAD WITHOUT CONTRAST TECHNIQUE: Contiguous axial images were obtained from the base of the skull through the vertex without intravenous contrast. COMPARISON:  Brain MRI 11/21/2012.  Head CT 11/20/2012. FINDINGS: There is no evidence of acute cortical infarct, intracranial hemorrhage, mass, midline shift, or extra-axial fluid collection. Moderate cerebral atrophy is unchanged. Periventricular white-matter hypodensities are stable to slightly progressed compared to the prior CT and are nonspecific but compatible with moderate chronic small vessel ischemic disease. Prior bilateral cataract extraction is noted. The visualized paranasal sinuses and mastoid air cells are clear. Calcified atherosclerosis is noted at the skullbase. IMPRESSION: 1. No evidence of acute intracranial abnormality. 2. Moderate chronic small vessel ischemic disease and cerebral atrophy. Electronically Signed   By: Sebastian Ache M.D.   On: 03/08/2015 16:40    Microbiology: Recent Results (from the past 240 hour(s))  Urine culture     Status: None   Collection Time: 03/08/15  3:16 PM  Result Value Ref Range Status   Specimen Description URINE, CLEAN CATCH  Final   Special Requests NONE  Final   Culture   Final    MULTIPLE SPECIES PRESENT, SUGGEST RECOLLECTION Performed at [facility]    Report Status 03/10/2015 FINAL  Final  MRSA PCR Screening     Status: None   Collection  Time: 03/08/15  9:20 PM  Result Value Ref Range Status   MRSA by PCR NEGATIVE NEGATIVE Final    Comment:        The GeneXpert MRSA Assay (FDA approved for NASAL specimens only), is one component of a comprehensive MRSA colonization surveillance program. It is not intended to diagnose MRSA infection nor to guide or monitor treatment for MRSA infections.      Labs: Basic Metabolic Panel:  Recent Labs Lab 03/08/15 1626 03/08/15 1645 03/09/15 0440 03/11/15 0405  NA 138  --  138 136  K 4.4  --  3.2* 3.8  CL 102  --  108 104  CO2 28  --  22 26  GLUCOSE 120*  --  101* 96  BUN 12  --  9 11  CREATININE 1.11*  --  0.67 0.58  CALCIUM 9.6  --  8.5* 9.1  MG 2.6*  --   --   --   PHOS  --  4.4  --   --    Liver Function Tests:  Recent Labs Lab 03/08/15 1626 03/09/15 0440  AST 33 15  ALT 19 11*  ALKPHOS 103 84  BILITOT 1.4* 0.5  PROT 8.1 5.4*  ALBUMIN 4.7 3.2*   No results for input(s): LIPASE, AMYLASE in the last 168 hours. No results for input(s): AMMONIA in the last 168 hours. CBC:  Recent Labs Lab 03/08/15 1626 03/09/15 0440 03/11/15 0405  WBC 8.2 7.7 7.0  NEUTROABS 6.4 4.2  --   HGB 16.0* 12.3 12.9  HCT 47.6* 36.7 39.6  MCV 94.6 94.6 94.5  PLT 259 240 243   Cardiac Enzymes:  Recent Labs Lab 03/09/15 0440  TROPONINI <0.03   BNP: BNP (last 3 results) No results for input(s): BNP in the last 8760 hours.  ProBNP (last 3 results) No results for input(s): PROBNP in the last 8760 hours.  CBG: No results for input(s): GLUCAP in the last 168 hours.

## 2015-03-11 NOTE — Care Management Note (Signed)
Case Management Note  Patient Details  Name: Frederik SchmidtJanie B Magner MRN: [id number] Date of Birth: March 24, 1927  Subjective/Objective:   PT-HHPT.Await HHPT, face to face order prior d/c.Nsg notified.   Will need to fax d/c summary,h&p,HHPT order,face to face once received.              Action/Plan:[facility].   Expected Discharge Date:                  Expected Discharge Plan:  Home w Home Health Services  In-House Referral:  Clinical Social Work  Discharge planning Services  CM Consult  Post Acute Care Choice:    Choice offered to:  Patient  DME Arranged:    DME Agency:     HH Arranged:  PT HH Agency:   ([facility] provides HHPT)  Status of Service:  Completed, signed off  Medicare Important Message Given:    Date Medicare IM Given:    Medicare IM give by:    Date Additional Medicare IM Given:    Additional Medicare Important Message give by:     If discussed at Long Length of Stay Meetings, dates discussed:    Additional Comments:  Lanier ClamMahabir, Mamye Bolds, RN 03/11/2015, 12:29 PM

## 2015-03-11 NOTE — Discharge Instructions (Signed)
Urinary Tract Infection °Urinary tract infections (UTIs) can develop anywhere along your urinary tract. Your urinary tract is your body's drainage system for removing wastes and extra water. Your urinary tract includes two kidneys, two ureters, a bladder, and a urethra. Your kidneys are a pair of bean-shaped organs. Each kidney is about the size of your fist. They are located below your ribs, one on each side of your spine. °CAUSES °Infections are caused by microbes, which are microscopic organisms, including fungi, viruses, and bacteria. These organisms are so small that they can only be seen through a microscope. Bacteria are the microbes that most commonly cause UTIs. °SYMPTOMS  °Symptoms of UTIs may vary by age and gender of the patient and by the location of the infection. Symptoms in young women typically include a frequent and intense urge to urinate and a painful, burning feeling in the bladder or urethra during urination. Older women and men are more likely to be tired, shaky, and weak and have muscle aches and abdominal pain. A fever may mean the infection is in your kidneys. Other symptoms of a kidney infection include pain in your back or sides below the ribs, nausea, and vomiting. °DIAGNOSIS °To diagnose a UTI, your caregiver will ask you about your symptoms. Your caregiver will also ask you to provide a urine sample. The urine sample will be tested for bacteria and white blood cells. White blood cells are made by your body to help fight infection. °TREATMENT  °Typically, UTIs can be treated with medication. Because most UTIs are caused by a bacterial infection, they usually can be treated with the use of antibiotics. The choice of antibiotic and length of treatment depend on your symptoms and the type of bacteria causing your infection. °HOME CARE INSTRUCTIONS °· If you were prescribed antibiotics, take them exactly as your caregiver instructs you. Finish the medication even if you feel better after  you have only taken some of the medication. °· Drink enough water and fluids to keep your urine clear or pale yellow. °· Avoid caffeine, tea, and carbonated beverages. They tend to irritate your bladder. °· Empty your bladder often. Avoid holding urine for long periods of time. °· Empty your bladder before and after sexual intercourse. °· After a bowel movement, women should cleanse from front to back. Use each tissue only once. °SEEK MEDICAL CARE IF:  °· You have back pain. °· You develop a fever. °· Your symptoms do not begin to resolve within 3 days. °SEEK IMMEDIATE MEDICAL CARE IF:  °· You have severe back pain or lower abdominal pain. °· You develop chills. °· You have nausea or vomiting. °· You have continued burning or discomfort with urination. °MAKE SURE YOU:  °· Understand these instructions. °· Will watch your condition. °· Will get help right away if you are not doing well or get worse. °  °This information is not intended to replace advice given to you by your health care provider. Make sure you discuss any questions you have with your health care provider. °  °Document Released: 02/23/2005 Document Revised: 02/04/2015 Document Reviewed: 06/24/2011 °Elsevier Interactive Patient Education ©2016 Elsevier Inc. °Nitrofurantoin tablets or capsules °What is this medicine? °NITROFURANTOIN (nye troe fyoor AN toyn) is an antibiotic. It is used to treat urinary tract infections. °This medicine may be used for other purposes; ask your health care provider or pharmacist if you have questions. °What should I tell my health care provider before I take this medicine? °They need to know   if you have any of these conditions: °-anemia °-diabetes °-glucose-6-phosphate dehydrogenase deficiency °-kidney disease °-liver disease °-lung disease °-other chronic illness °-an unusual or allergic reaction to nitrofurantoin, other antibiotics, other medicines, foods, dyes or preservatives °-pregnant or trying to get  pregnant °-breast-feeding °How should I use this medicine? °Take this medicine by mouth with a glass of water. Follow the directions on the prescription label. Take this medicine with food or milk. Take your doses at regular intervals. Do not take your medicine more often than directed. Do not stop taking except on your doctor's advice. °Talk to your pediatrician regarding the use of this medicine in children. While this drug may be prescribed for selected conditions, precautions do apply. °Overdosage: If you think you have taken too much of this medicine contact a poison control center or emergency room at once. °NOTE: This medicine is only for you. Do not share this medicine with others. °What if I miss a dose? °If you miss a dose, take it as soon as you can. If it is almost time for your next dose, take only that dose. Do not take double or extra doses. °What may interact with this medicine? °-antacids containing magnesium trisilicate °-probenecid °-quinolone antibiotics like ciprofloxacin, lomefloxacin, norfloxacin and ofloxacin °-sulfinpyrazone °This list may not describe all possible interactions. Give your health care provider a list of all the medicines, herbs, non-prescription drugs, or dietary supplements you use. Also tell them if you smoke, drink alcohol, or use illegal drugs. Some items may interact with your medicine. °What should I watch for while using this medicine? °Tell your doctor or health care professional if your symptoms do not improve or if you get new symptoms. Drink several glasses of water a day. If you are taking this medicine for a long time, visit your doctor for regular checks on your progress. °If you are diabetic, you may get a false positive result for sugar in your urine with certain brands of urine tests. Check with your doctor. °What side effects may I notice from receiving this medicine? °Side effects that you should report to your doctor or health care professional as soon as  possible: °-allergic reactions like skin rash or hives, swelling of the face, lips, or tongue °-chest pain °-cough °-difficulty breathing °-dizziness, drowsiness °-fever or infection °-joint aches or pains °-pale or blue-tinted skin °-redness, blistering, peeling or loosening of the skin, including inside the mouth °-tingling, burning, pain, or numbness in hands or feet °-unusual bleeding or bruising °-unusually weak or tired °-yellowing of eyes or skin °Side effects that usually do not require medical attention (report to your doctor or health care professional if they continue or are bothersome): °-dark urine °-diarrhea °-headache °-loss of appetite °-nausea or vomiting °-temporary hair loss °This list may not describe all possible side effects. Call your doctor for medical advice about side effects. You may report side effects to FDA at 1-800-FDA-1088. °Where should I keep my medicine? °Keep out of the reach of children. °Store at room temperature between 15 and 30 degrees C (59 and 86 degrees F). Protect from light. Throw away any unused medicine after the expiration date. °NOTE: This sheet is a summary. It may not cover all possible information. If you have questions about this medicine, talk to your doctor, pharmacist, or health care provider. °  °© 2016, Elsevier/Gold Standard. (2007-12-05 15:56:47) ° °

## 2015-03-16 ENCOUNTER — Telehealth: Payer: Self-pay

## 2015-03-16 DIAGNOSIS — B351 Tinea unguium: Secondary | ICD-10-CM | POA: Diagnosis not present

## 2015-03-16 NOTE — Telephone Encounter (Signed)
PCP: Danise EdgeBLYTH, STACEY, MD  Admit date: 03/08/2015 Discharge date: 03/11/2015  Recommendations for Outpatient Follow-up:  1. Pt will continue Macrobid for 4 days on discharge for UTI  Called and left a message for call back.  Pt has an appointment scheduled for 04/03/15 at 11:15 am.  Called to see if patient could come in sooner as her discharge summary recommended follow up in 1 week.

## 2015-03-16 NOTE — Telephone Encounter (Signed)
Daughter-in-law [name] states she would like to keep appointment as scheduled.  She says that [facility] is [facility] and they may cancel scheduled appointment if patient continues to do well.  Message routed to PCP for FYI.

## 2015-03-17 ENCOUNTER — Telehealth: Payer: Self-pay | Admitting: Family Medicine

## 2015-03-17 DIAGNOSIS — R350 Frequency of micturition: Secondary | ICD-10-CM | POA: Diagnosis not present

## 2015-03-17 DIAGNOSIS — R55 Syncope and collapse: Secondary | ICD-10-CM | POA: Diagnosis not present

## 2015-03-17 DIAGNOSIS — E440 Moderate protein-calorie malnutrition: Secondary | ICD-10-CM | POA: Diagnosis not present

## 2015-03-17 DIAGNOSIS — I129 Hypertensive chronic kidney disease with stage 1 through stage 4 chronic kidney disease, or unspecified chronic kidney disease: Secondary | ICD-10-CM | POA: Diagnosis not present

## 2015-03-17 DIAGNOSIS — M4800 Spinal stenosis, site unspecified: Secondary | ICD-10-CM | POA: Diagnosis not present

## 2015-03-17 DIAGNOSIS — N189 Chronic kidney disease, unspecified: Secondary | ICD-10-CM | POA: Diagnosis not present

## 2015-03-17 NOTE — Telephone Encounter (Signed)
OK to give verbal order to start home health care.

## 2015-03-17 NOTE — Telephone Encounter (Signed)
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

## 2015-03-18 NOTE — Telephone Encounter (Signed)
Spoke with Okey Regalarol.  Verbal order given.

## 2015-03-18 NOTE — Telephone Encounter (Signed)
Left a message for call back.  

## 2015-03-19 DIAGNOSIS — R350 Frequency of micturition: Secondary | ICD-10-CM | POA: Diagnosis not present

## 2015-03-19 DIAGNOSIS — I129 Hypertensive chronic kidney disease with stage 1 through stage 4 chronic kidney disease, or unspecified chronic kidney disease: Secondary | ICD-10-CM | POA: Diagnosis not present

## 2015-03-19 DIAGNOSIS — N189 Chronic kidney disease, unspecified: Secondary | ICD-10-CM | POA: Diagnosis not present

## 2015-03-19 DIAGNOSIS — R55 Syncope and collapse: Secondary | ICD-10-CM | POA: Diagnosis not present

## 2015-03-19 DIAGNOSIS — M4800 Spinal stenosis, site unspecified: Secondary | ICD-10-CM | POA: Diagnosis not present

## 2015-03-19 DIAGNOSIS — E440 Moderate protein-calorie malnutrition: Secondary | ICD-10-CM | POA: Diagnosis not present

## 2015-03-20 DIAGNOSIS — N189 Chronic kidney disease, unspecified: Secondary | ICD-10-CM | POA: Diagnosis not present

## 2015-03-20 DIAGNOSIS — R55 Syncope and collapse: Secondary | ICD-10-CM | POA: Diagnosis not present

## 2015-03-20 DIAGNOSIS — I129 Hypertensive chronic kidney disease with stage 1 through stage 4 chronic kidney disease, or unspecified chronic kidney disease: Secondary | ICD-10-CM | POA: Diagnosis not present

## 2015-03-20 DIAGNOSIS — R350 Frequency of micturition: Secondary | ICD-10-CM | POA: Diagnosis not present

## 2015-03-20 DIAGNOSIS — E440 Moderate protein-calorie malnutrition: Secondary | ICD-10-CM | POA: Diagnosis not present

## 2015-03-20 DIAGNOSIS — M4800 Spinal stenosis, site unspecified: Secondary | ICD-10-CM | POA: Diagnosis not present

## 2015-03-24 DIAGNOSIS — I129 Hypertensive chronic kidney disease with stage 1 through stage 4 chronic kidney disease, or unspecified chronic kidney disease: Secondary | ICD-10-CM | POA: Diagnosis not present

## 2015-03-24 DIAGNOSIS — M4800 Spinal stenosis, site unspecified: Secondary | ICD-10-CM | POA: Diagnosis not present

## 2015-03-24 DIAGNOSIS — N189 Chronic kidney disease, unspecified: Secondary | ICD-10-CM | POA: Diagnosis not present

## 2015-03-24 DIAGNOSIS — R55 Syncope and collapse: Secondary | ICD-10-CM | POA: Diagnosis not present

## 2015-03-24 DIAGNOSIS — R350 Frequency of micturition: Secondary | ICD-10-CM | POA: Diagnosis not present

## 2015-03-24 DIAGNOSIS — E440 Moderate protein-calorie malnutrition: Secondary | ICD-10-CM | POA: Diagnosis not present

## 2015-03-25 DIAGNOSIS — E440 Moderate protein-calorie malnutrition: Secondary | ICD-10-CM | POA: Diagnosis not present

## 2015-03-25 DIAGNOSIS — I129 Hypertensive chronic kidney disease with stage 1 through stage 4 chronic kidney disease, or unspecified chronic kidney disease: Secondary | ICD-10-CM | POA: Diagnosis not present

## 2015-03-25 DIAGNOSIS — N189 Chronic kidney disease, unspecified: Secondary | ICD-10-CM | POA: Diagnosis not present

## 2015-03-25 DIAGNOSIS — R350 Frequency of micturition: Secondary | ICD-10-CM | POA: Diagnosis not present

## 2015-03-25 DIAGNOSIS — M4800 Spinal stenosis, site unspecified: Secondary | ICD-10-CM | POA: Diagnosis not present

## 2015-03-25 DIAGNOSIS — R55 Syncope and collapse: Secondary | ICD-10-CM | POA: Diagnosis not present

## 2015-03-27 DIAGNOSIS — E440 Moderate protein-calorie malnutrition: Secondary | ICD-10-CM | POA: Diagnosis not present

## 2015-03-27 DIAGNOSIS — M4800 Spinal stenosis, site unspecified: Secondary | ICD-10-CM | POA: Diagnosis not present

## 2015-03-27 DIAGNOSIS — N189 Chronic kidney disease, unspecified: Secondary | ICD-10-CM | POA: Diagnosis not present

## 2015-03-27 DIAGNOSIS — R350 Frequency of micturition: Secondary | ICD-10-CM | POA: Diagnosis not present

## 2015-03-27 DIAGNOSIS — R55 Syncope and collapse: Secondary | ICD-10-CM | POA: Diagnosis not present

## 2015-03-27 DIAGNOSIS — I129 Hypertensive chronic kidney disease with stage 1 through stage 4 chronic kidney disease, or unspecified chronic kidney disease: Secondary | ICD-10-CM | POA: Diagnosis not present

## 2015-03-30 ENCOUNTER — Other Ambulatory Visit: Payer: Self-pay | Admitting: Family Medicine

## 2015-03-30 DIAGNOSIS — M4800 Spinal stenosis, site unspecified: Secondary | ICD-10-CM | POA: Diagnosis not present

## 2015-03-30 DIAGNOSIS — I129 Hypertensive chronic kidney disease with stage 1 through stage 4 chronic kidney disease, or unspecified chronic kidney disease: Secondary | ICD-10-CM | POA: Diagnosis not present

## 2015-03-30 DIAGNOSIS — E440 Moderate protein-calorie malnutrition: Secondary | ICD-10-CM | POA: Diagnosis not present

## 2015-03-30 DIAGNOSIS — R55 Syncope and collapse: Secondary | ICD-10-CM | POA: Diagnosis not present

## 2015-03-30 DIAGNOSIS — N189 Chronic kidney disease, unspecified: Secondary | ICD-10-CM | POA: Diagnosis not present

## 2015-03-30 DIAGNOSIS — R350 Frequency of micturition: Secondary | ICD-10-CM | POA: Diagnosis not present

## 2015-03-30 MED ORDER — AMLODIPINE BESYLATE 5 MG PO TABS: 5.0000 mg | ORAL_TABLET | Freq: Every day | ORAL | Status: DC

## 2015-03-31 DIAGNOSIS — M4800 Spinal stenosis, site unspecified: Secondary | ICD-10-CM | POA: Diagnosis not present

## 2015-03-31 DIAGNOSIS — R55 Syncope and collapse: Secondary | ICD-10-CM | POA: Diagnosis not present

## 2015-03-31 DIAGNOSIS — I129 Hypertensive chronic kidney disease with stage 1 through stage 4 chronic kidney disease, or unspecified chronic kidney disease: Secondary | ICD-10-CM | POA: Diagnosis not present

## 2015-03-31 DIAGNOSIS — E440 Moderate protein-calorie malnutrition: Secondary | ICD-10-CM | POA: Diagnosis not present

## 2015-03-31 DIAGNOSIS — R350 Frequency of micturition: Secondary | ICD-10-CM | POA: Diagnosis not present

## 2015-03-31 DIAGNOSIS — N189 Chronic kidney disease, unspecified: Secondary | ICD-10-CM | POA: Diagnosis not present

## 2015-04-01 ENCOUNTER — Telehealth: Payer: Self-pay | Admitting: *Deleted

## 2015-04-01 DIAGNOSIS — N390 Urinary tract infection, site not specified: Secondary | ICD-10-CM | POA: Diagnosis not present

## 2015-04-01 DIAGNOSIS — M4800 Spinal stenosis, site unspecified: Secondary | ICD-10-CM | POA: Diagnosis not present

## 2015-04-01 DIAGNOSIS — R55 Syncope and collapse: Secondary | ICD-10-CM | POA: Diagnosis not present

## 2015-04-01 DIAGNOSIS — R350 Frequency of micturition: Secondary | ICD-10-CM | POA: Diagnosis not present

## 2015-04-01 DIAGNOSIS — I129 Hypertensive chronic kidney disease with stage 1 through stage 4 chronic kidney disease, or unspecified chronic kidney disease: Secondary | ICD-10-CM | POA: Diagnosis not present

## 2015-04-01 DIAGNOSIS — N189 Chronic kidney disease, unspecified: Secondary | ICD-10-CM | POA: Diagnosis not present

## 2015-04-01 DIAGNOSIS — E440 Moderate protein-calorie malnutrition: Secondary | ICD-10-CM | POA: Diagnosis not present

## 2015-04-01 NOTE — Telephone Encounter (Signed)
Received and forwarded to Dr. Blyth. JG//CMA  

## 2015-04-02 ENCOUNTER — Other Ambulatory Visit: Payer: Self-pay | Admitting: Family Medicine

## 2015-04-02 MED ORDER — CITALOPRAM HYDROBROMIDE 10 MG PO TABS
10.0000 mg | ORAL_TABLET | Freq: Every day | ORAL | Status: DC
Start: 2015-04-02 — End: 2018-01-15

## 2015-04-03 ENCOUNTER — Telehealth: Payer: Self-pay | Admitting: Family Medicine

## 2015-04-03 ENCOUNTER — Ambulatory Visit: Payer: Medicare Other | Admitting: Family Medicine

## 2015-04-03 DIAGNOSIS — I129 Hypertensive chronic kidney disease with stage 1 through stage 4 chronic kidney disease, or unspecified chronic kidney disease: Secondary | ICD-10-CM | POA: Diagnosis not present

## 2015-04-03 DIAGNOSIS — E440 Moderate protein-calorie malnutrition: Secondary | ICD-10-CM | POA: Diagnosis not present

## 2015-04-03 DIAGNOSIS — M4800 Spinal stenosis, site unspecified: Secondary | ICD-10-CM | POA: Diagnosis not present

## 2015-04-03 DIAGNOSIS — R350 Frequency of micturition: Secondary | ICD-10-CM | POA: Diagnosis not present

## 2015-04-03 DIAGNOSIS — R55 Syncope and collapse: Secondary | ICD-10-CM | POA: Diagnosis not present

## 2015-04-03 DIAGNOSIS — N189 Chronic kidney disease, unspecified: Secondary | ICD-10-CM | POA: Diagnosis not present

## 2015-04-03 NOTE — Telephone Encounter (Signed)
[place]  [facility] at [phone] left detailed message PCP saw the result.  OK to take keflex 500 mg bid for 5 days. Also faxed result with instructions. As well.

## 2015-04-03 NOTE — Telephone Encounter (Signed)
Caller name: Geraldine ContrasDee Relation to pt: [facility]  Call back number: [phone]   Reason for call:  Faxed over urinalysis results and requesting antibiotics, please write on results RX fax [phone]

## 2015-04-03 NOTE — Telephone Encounter (Signed)
Received results from fax machine and is on counter for PCP to review

## 2015-04-06 DIAGNOSIS — I129 Hypertensive chronic kidney disease with stage 1 through stage 4 chronic kidney disease, or unspecified chronic kidney disease: Secondary | ICD-10-CM | POA: Diagnosis not present

## 2015-04-06 DIAGNOSIS — R350 Frequency of micturition: Secondary | ICD-10-CM | POA: Diagnosis not present

## 2015-04-06 DIAGNOSIS — M4800 Spinal stenosis, site unspecified: Secondary | ICD-10-CM | POA: Diagnosis not present

## 2015-04-06 DIAGNOSIS — N189 Chronic kidney disease, unspecified: Secondary | ICD-10-CM | POA: Diagnosis not present

## 2015-04-06 DIAGNOSIS — E440 Moderate protein-calorie malnutrition: Secondary | ICD-10-CM | POA: Diagnosis not present

## 2015-04-06 DIAGNOSIS — R55 Syncope and collapse: Secondary | ICD-10-CM | POA: Diagnosis not present

## 2015-04-07 DIAGNOSIS — R350 Frequency of micturition: Secondary | ICD-10-CM | POA: Diagnosis not present

## 2015-04-07 DIAGNOSIS — I129 Hypertensive chronic kidney disease with stage 1 through stage 4 chronic kidney disease, or unspecified chronic kidney disease: Secondary | ICD-10-CM | POA: Diagnosis not present

## 2015-04-07 DIAGNOSIS — M4800 Spinal stenosis, site unspecified: Secondary | ICD-10-CM | POA: Diagnosis not present

## 2015-04-07 DIAGNOSIS — N189 Chronic kidney disease, unspecified: Secondary | ICD-10-CM | POA: Diagnosis not present

## 2015-04-07 DIAGNOSIS — R55 Syncope and collapse: Secondary | ICD-10-CM | POA: Diagnosis not present

## 2015-04-07 DIAGNOSIS — E440 Moderate protein-calorie malnutrition: Secondary | ICD-10-CM | POA: Diagnosis not present

## 2015-04-08 DIAGNOSIS — R55 Syncope and collapse: Secondary | ICD-10-CM | POA: Diagnosis not present

## 2015-04-08 DIAGNOSIS — M4800 Spinal stenosis, site unspecified: Secondary | ICD-10-CM | POA: Diagnosis not present

## 2015-04-08 DIAGNOSIS — E440 Moderate protein-calorie malnutrition: Secondary | ICD-10-CM | POA: Diagnosis not present

## 2015-04-08 DIAGNOSIS — N189 Chronic kidney disease, unspecified: Secondary | ICD-10-CM | POA: Diagnosis not present

## 2015-04-08 DIAGNOSIS — I129 Hypertensive chronic kidney disease with stage 1 through stage 4 chronic kidney disease, or unspecified chronic kidney disease: Secondary | ICD-10-CM | POA: Diagnosis not present

## 2015-04-08 DIAGNOSIS — R350 Frequency of micturition: Secondary | ICD-10-CM | POA: Diagnosis not present

## 2015-04-09 DIAGNOSIS — R55 Syncope and collapse: Secondary | ICD-10-CM | POA: Diagnosis not present

## 2015-04-09 DIAGNOSIS — R350 Frequency of micturition: Secondary | ICD-10-CM | POA: Diagnosis not present

## 2015-04-09 DIAGNOSIS — E440 Moderate protein-calorie malnutrition: Secondary | ICD-10-CM | POA: Diagnosis not present

## 2015-04-09 DIAGNOSIS — N189 Chronic kidney disease, unspecified: Secondary | ICD-10-CM | POA: Diagnosis not present

## 2015-04-09 DIAGNOSIS — I129 Hypertensive chronic kidney disease with stage 1 through stage 4 chronic kidney disease, or unspecified chronic kidney disease: Secondary | ICD-10-CM | POA: Diagnosis not present

## 2015-04-09 DIAGNOSIS — M4800 Spinal stenosis, site unspecified: Secondary | ICD-10-CM | POA: Diagnosis not present

## 2015-04-10 ENCOUNTER — Other Ambulatory Visit: Payer: Self-pay | Admitting: Family Medicine

## 2015-04-10 MED ORDER — GABAPENTIN 100 MG PO CAPS: ORAL_CAPSULE | ORAL | Status: DC

## 2015-04-15 DIAGNOSIS — I129 Hypertensive chronic kidney disease with stage 1 through stage 4 chronic kidney disease, or unspecified chronic kidney disease: Secondary | ICD-10-CM | POA: Diagnosis not present

## 2015-04-15 DIAGNOSIS — E440 Moderate protein-calorie malnutrition: Secondary | ICD-10-CM | POA: Diagnosis not present

## 2015-04-15 DIAGNOSIS — M4800 Spinal stenosis, site unspecified: Secondary | ICD-10-CM | POA: Diagnosis not present

## 2015-04-15 DIAGNOSIS — N189 Chronic kidney disease, unspecified: Secondary | ICD-10-CM | POA: Diagnosis not present

## 2015-04-15 DIAGNOSIS — R350 Frequency of micturition: Secondary | ICD-10-CM | POA: Diagnosis not present

## 2015-04-15 DIAGNOSIS — R55 Syncope and collapse: Secondary | ICD-10-CM | POA: Diagnosis not present

## 2015-04-17 DIAGNOSIS — R350 Frequency of micturition: Secondary | ICD-10-CM | POA: Diagnosis not present

## 2015-04-17 DIAGNOSIS — I129 Hypertensive chronic kidney disease with stage 1 through stage 4 chronic kidney disease, or unspecified chronic kidney disease: Secondary | ICD-10-CM | POA: Diagnosis not present

## 2015-04-17 DIAGNOSIS — R55 Syncope and collapse: Secondary | ICD-10-CM | POA: Diagnosis not present

## 2015-04-17 DIAGNOSIS — E440 Moderate protein-calorie malnutrition: Secondary | ICD-10-CM | POA: Diagnosis not present

## 2015-04-17 DIAGNOSIS — M4800 Spinal stenosis, site unspecified: Secondary | ICD-10-CM | POA: Diagnosis not present

## 2015-04-17 DIAGNOSIS — N189 Chronic kidney disease, unspecified: Secondary | ICD-10-CM | POA: Diagnosis not present

## 2015-04-20 DIAGNOSIS — M4800 Spinal stenosis, site unspecified: Secondary | ICD-10-CM | POA: Diagnosis not present

## 2015-04-20 DIAGNOSIS — R350 Frequency of micturition: Secondary | ICD-10-CM | POA: Diagnosis not present

## 2015-04-20 DIAGNOSIS — R55 Syncope and collapse: Secondary | ICD-10-CM | POA: Diagnosis not present

## 2015-04-20 DIAGNOSIS — I129 Hypertensive chronic kidney disease with stage 1 through stage 4 chronic kidney disease, or unspecified chronic kidney disease: Secondary | ICD-10-CM | POA: Diagnosis not present

## 2015-04-20 DIAGNOSIS — E440 Moderate protein-calorie malnutrition: Secondary | ICD-10-CM | POA: Diagnosis not present

## 2015-04-20 DIAGNOSIS — N189 Chronic kidney disease, unspecified: Secondary | ICD-10-CM | POA: Diagnosis not present

## 2015-04-22 DIAGNOSIS — E440 Moderate protein-calorie malnutrition: Secondary | ICD-10-CM | POA: Diagnosis not present

## 2015-04-22 DIAGNOSIS — N189 Chronic kidney disease, unspecified: Secondary | ICD-10-CM | POA: Diagnosis not present

## 2015-04-22 DIAGNOSIS — R55 Syncope and collapse: Secondary | ICD-10-CM | POA: Diagnosis not present

## 2015-04-22 DIAGNOSIS — I129 Hypertensive chronic kidney disease with stage 1 through stage 4 chronic kidney disease, or unspecified chronic kidney disease: Secondary | ICD-10-CM | POA: Diagnosis not present

## 2015-04-22 DIAGNOSIS — R350 Frequency of micturition: Secondary | ICD-10-CM | POA: Diagnosis not present

## 2015-04-22 DIAGNOSIS — M4800 Spinal stenosis, site unspecified: Secondary | ICD-10-CM | POA: Diagnosis not present

## 2015-04-28 DIAGNOSIS — M4800 Spinal stenosis, site unspecified: Secondary | ICD-10-CM | POA: Diagnosis not present

## 2015-04-28 DIAGNOSIS — R55 Syncope and collapse: Secondary | ICD-10-CM | POA: Diagnosis not present

## 2015-04-28 DIAGNOSIS — I129 Hypertensive chronic kidney disease with stage 1 through stage 4 chronic kidney disease, or unspecified chronic kidney disease: Secondary | ICD-10-CM | POA: Diagnosis not present

## 2015-04-28 DIAGNOSIS — E440 Moderate protein-calorie malnutrition: Secondary | ICD-10-CM | POA: Diagnosis not present

## 2015-04-28 DIAGNOSIS — N189 Chronic kidney disease, unspecified: Secondary | ICD-10-CM | POA: Diagnosis not present

## 2015-04-28 DIAGNOSIS — R350 Frequency of micturition: Secondary | ICD-10-CM | POA: Diagnosis not present

## 2015-04-30 DIAGNOSIS — I129 Hypertensive chronic kidney disease with stage 1 through stage 4 chronic kidney disease, or unspecified chronic kidney disease: Secondary | ICD-10-CM | POA: Diagnosis not present

## 2015-04-30 DIAGNOSIS — R55 Syncope and collapse: Secondary | ICD-10-CM | POA: Diagnosis not present

## 2015-04-30 DIAGNOSIS — R350 Frequency of micturition: Secondary | ICD-10-CM | POA: Diagnosis not present

## 2015-04-30 DIAGNOSIS — N189 Chronic kidney disease, unspecified: Secondary | ICD-10-CM | POA: Diagnosis not present

## 2015-04-30 DIAGNOSIS — E440 Moderate protein-calorie malnutrition: Secondary | ICD-10-CM | POA: Diagnosis not present

## 2015-04-30 DIAGNOSIS — M4800 Spinal stenosis, site unspecified: Secondary | ICD-10-CM | POA: Diagnosis not present

## 2015-05-20 ENCOUNTER — Telehealth: Payer: Self-pay | Admitting: *Deleted

## 2015-05-20 NOTE — Telephone Encounter (Signed)
Received home Health Certification and Plan of Care for patient to be completed by PCP, who will not return to office until 06/04/15; called [facility] and [facility] for Darlene Folksmanda [Light] to inform her of this information/SLS

## 2015-06-03 NOTE — Telephone Encounter (Signed)
Will complete paperwork in morning

## 2015-06-05 ENCOUNTER — Telehealth: Payer: Self-pay

## 2015-06-05 NOTE — Telephone Encounter (Signed)
Received note of residents weekly pulse 78.

## 2015-06-09 DIAGNOSIS — R488 Other symbolic dysfunctions: Secondary | ICD-10-CM | POA: Diagnosis not present

## 2015-06-09 DIAGNOSIS — R296 Repeated falls: Secondary | ICD-10-CM | POA: Diagnosis not present

## 2015-06-09 DIAGNOSIS — M545 Low back pain: Secondary | ICD-10-CM | POA: Diagnosis not present

## 2015-06-09 DIAGNOSIS — R2689 Other abnormalities of gait and mobility: Secondary | ICD-10-CM | POA: Diagnosis not present

## 2015-06-09 DIAGNOSIS — M6281 Muscle weakness (generalized): Secondary | ICD-10-CM | POA: Diagnosis not present

## 2015-06-09 DIAGNOSIS — R2681 Unsteadiness on feet: Secondary | ICD-10-CM | POA: Diagnosis not present

## 2015-06-09 DIAGNOSIS — R29898 Other symptoms and signs involving the musculoskeletal system: Secondary | ICD-10-CM | POA: Diagnosis not present

## 2015-06-09 NOTE — Telephone Encounter (Signed)
Completed paperwork faxed to [facility]/SLS 01/09

## 2015-06-10 ENCOUNTER — Telehealth: Payer: Self-pay | Admitting: *Deleted

## 2015-06-10 DIAGNOSIS — R296 Repeated falls: Secondary | ICD-10-CM | POA: Diagnosis not present

## 2015-06-10 DIAGNOSIS — R29898 Other symptoms and signs involving the musculoskeletal system: Secondary | ICD-10-CM | POA: Diagnosis not present

## 2015-06-10 DIAGNOSIS — R2681 Unsteadiness on feet: Secondary | ICD-10-CM | POA: Diagnosis not present

## 2015-06-10 DIAGNOSIS — M545 Low back pain: Secondary | ICD-10-CM | POA: Diagnosis not present

## 2015-06-10 DIAGNOSIS — R2689 Other abnormalities of gait and mobility: Secondary | ICD-10-CM | POA: Diagnosis not present

## 2015-06-10 DIAGNOSIS — M6281 Muscle weakness (generalized): Secondary | ICD-10-CM | POA: Diagnosis not present

## 2015-06-10 NOTE — Telephone Encounter (Signed)
Received and forwarded to Dr. Blyth. JG//CMA  

## 2015-06-15 NOTE — Telephone Encounter (Signed)
Completed forms faxed, sent to scan/SLS 01/13

## 2015-06-16 DIAGNOSIS — R296 Repeated falls: Secondary | ICD-10-CM | POA: Diagnosis not present

## 2015-06-16 DIAGNOSIS — R29898 Other symptoms and signs involving the musculoskeletal system: Secondary | ICD-10-CM | POA: Diagnosis not present

## 2015-06-16 DIAGNOSIS — R2681 Unsteadiness on feet: Secondary | ICD-10-CM | POA: Diagnosis not present

## 2015-06-16 DIAGNOSIS — M545 Low back pain: Secondary | ICD-10-CM | POA: Diagnosis not present

## 2015-06-16 DIAGNOSIS — R2689 Other abnormalities of gait and mobility: Secondary | ICD-10-CM | POA: Diagnosis not present

## 2015-06-16 DIAGNOSIS — M6281 Muscle weakness (generalized): Secondary | ICD-10-CM | POA: Diagnosis not present

## 2015-06-17 DIAGNOSIS — R296 Repeated falls: Secondary | ICD-10-CM | POA: Diagnosis not present

## 2015-06-17 DIAGNOSIS — M545 Low back pain: Secondary | ICD-10-CM | POA: Diagnosis not present

## 2015-06-17 DIAGNOSIS — M6281 Muscle weakness (generalized): Secondary | ICD-10-CM | POA: Diagnosis not present

## 2015-06-17 DIAGNOSIS — R2689 Other abnormalities of gait and mobility: Secondary | ICD-10-CM | POA: Diagnosis not present

## 2015-06-17 DIAGNOSIS — R29898 Other symptoms and signs involving the musculoskeletal system: Secondary | ICD-10-CM | POA: Diagnosis not present

## 2015-06-17 DIAGNOSIS — R2681 Unsteadiness on feet: Secondary | ICD-10-CM | POA: Diagnosis not present

## 2015-06-18 DIAGNOSIS — R29898 Other symptoms and signs involving the musculoskeletal system: Secondary | ICD-10-CM | POA: Diagnosis not present

## 2015-06-18 DIAGNOSIS — R296 Repeated falls: Secondary | ICD-10-CM | POA: Diagnosis not present

## 2015-06-18 DIAGNOSIS — R2681 Unsteadiness on feet: Secondary | ICD-10-CM | POA: Diagnosis not present

## 2015-06-18 DIAGNOSIS — R2689 Other abnormalities of gait and mobility: Secondary | ICD-10-CM | POA: Diagnosis not present

## 2015-06-18 DIAGNOSIS — M6281 Muscle weakness (generalized): Secondary | ICD-10-CM | POA: Diagnosis not present

## 2015-06-18 DIAGNOSIS — M545 Low back pain: Secondary | ICD-10-CM | POA: Diagnosis not present

## 2015-06-19 ENCOUNTER — Telehealth: Payer: Self-pay | Admitting: Family Medicine

## 2015-06-19 NOTE — Telephone Encounter (Signed)
Caller name: Rashiya Lofland  Relation to pt:son  Call back [phone]    Reason for call:  Son is requesting patient to sign a DPR form at 06/23/2015 appointment with Dr. Abner Greenspan, son states she does not want to make his mother upset and would like PCP to address with patient while everyone is at the upcoming appointment. [facility] would like the form on file

## 2015-06-19 NOTE — Telephone Encounter (Signed)
Can we please print this phone note and attach to her KPN tool when she comes. I am worried if it is a busy morning that it may not get addressed unless we have a reminder

## 2015-06-19 NOTE — Telephone Encounter (Signed)
Will do, done.

## 2015-06-22 DIAGNOSIS — M545 Low back pain: Secondary | ICD-10-CM | POA: Diagnosis not present

## 2015-06-22 DIAGNOSIS — M6281 Muscle weakness (generalized): Secondary | ICD-10-CM | POA: Diagnosis not present

## 2015-06-22 DIAGNOSIS — R2689 Other abnormalities of gait and mobility: Secondary | ICD-10-CM | POA: Diagnosis not present

## 2015-06-22 DIAGNOSIS — R2681 Unsteadiness on feet: Secondary | ICD-10-CM | POA: Diagnosis not present

## 2015-06-22 DIAGNOSIS — R29898 Other symptoms and signs involving the musculoskeletal system: Secondary | ICD-10-CM | POA: Diagnosis not present

## 2015-06-22 DIAGNOSIS — R296 Repeated falls: Secondary | ICD-10-CM | POA: Diagnosis not present

## 2015-06-23 ENCOUNTER — Encounter: Payer: Self-pay | Admitting: Family Medicine

## 2015-06-23 ENCOUNTER — Ambulatory Visit (INDEPENDENT_AMBULATORY_CARE_PROVIDER_SITE_OTHER): Payer: Medicare Other | Admitting: Family Medicine

## 2015-06-23 VITALS — BP 132/72 | HR 79 | Temp 98.3°F | Ht 65.0 in | Wt 126.2 lb

## 2015-06-23 DIAGNOSIS — I10 Essential (primary) hypertension: Secondary | ICD-10-CM

## 2015-06-23 DIAGNOSIS — R739 Hyperglycemia, unspecified: Secondary | ICD-10-CM

## 2015-06-23 DIAGNOSIS — E079 Disorder of thyroid, unspecified: Secondary | ICD-10-CM | POA: Diagnosis not present

## 2015-06-23 DIAGNOSIS — Z0000 Encounter for general adult medical examination without abnormal findings: Secondary | ICD-10-CM

## 2015-06-23 NOTE — Progress Notes (Signed)
Subjective:    Patient ID: Darlene Wilson, female    DOB: 1926-06-04, 82 y.o.   MRN: [id number]  Chief Complaint  Patient presents with  . Medicare Wellness    HPI Patient is in today for wellness exam and follow up on numerous medical conditions. No recent illness. No hospitalizations. Is doing well at home. No difficulties with ADLs. Stays active and tries to maintain a heart healthy diet. Denies CP/palp/SOB/HA/congestion/fevers/GI or GU c/o. Taking meds as prescribed  Past Medical History  Diagnosis Date  . History of chicken pox   . Glaucoma     both eyes  . Hypertension   . Chronic kidney disease 1998    stones  . Stroke (HCC) 04/2007    x 3 total  . Thyroid disease     hypothyroid  . Fibrocystic breast 01/12/2011  . Insomnia 01/13/2011  . Urinary frequency 02/09/2011  . Vertigo 02/09/2011  . Spinal stenosis 06/14/2011  . Nausea 05/31/2012  . Memory loss 10/12/2012  . Depression with anxiety 09/14/2014  . Kidney stone     stones     Past Surgical History  Procedure Laterality Date  . Abdominal hysterectomy  1963  . Back surgery  1989    discectomy L4-5 1989, good results  . Breast surgery  1970    biopsy, benign, b/l    Family History  Problem Relation Age of Onset  . Heart disease Mother   . Hypertension Mother   . Diabetes Mother   . Heart disease Father   . Hypertension Father   . Cancer Sister     breast cancer  . Mental illness Brother     service connected    Social History   Social History  . Marital Status: Widowed    Spouse Name: N/A  . Number of Children: 2  . Years of Education: HS   Occupational History  . Retired    Social History Main Topics  . Smoking status: Never Smoker   . Smokeless tobacco: Never Used  . Alcohol Use: 0.6 oz/week    1 Glasses of wine per week     Comment: daily  . Drug Use: No  . Sexual Activity: Not Currently     Comment: lives alone, no dietary restrictions   Other Topics Concern  . Not on file    Social History Narrative   Patient lives at home alone.   Caffeine Use: 1-2 cups  daily    Outpatient Prescriptions Prior to Visit  Medication Sig Dispense Refill  . acetaminophen (TYLENOL) 325 MG tablet Take 2 tablets (650 mg total) by mouth every 6 (six) hours as needed.    Marland Kitchen amLODipine (NORVASC) 5 MG tablet Take 1 tablet (5 mg total) by mouth daily. 30 tablet 6  . citalopram (CELEXA) 10 MG tablet Take 1 tablet (10 mg total) by mouth daily. 90 tablet 2  . dipyridamole-aspirin (AGGRENOX) 200-25 MG per 12 hr capsule TAKE 1 CAPSULE BY MOUTH 2 (TWO) TIMES DAILY. 180 capsule 2  . donepezil (ARICEPT) 10 MG tablet Take 1 tablet (10 mg total) by mouth at bedtime. 90 tablet 1  . gabapentin (NEURONTIN) 100 MG capsule TAKE 1 CAPSULE (100 MG TOTAL) BY MOUTH AT BEDTIME. 90 capsule 1  . latanoprost (XALATAN) 0.005 % ophthalmic solution Place 1 drop into both eyes at bedtime. 2.5 mL 0  . levothyroxine (SYNTHROID, LEVOTHROID) 75 MCG tablet Take 1 tablet (75 mcg total) by mouth daily. 90 tablet 1  . lisinopril (  PRINIVIL,ZESTRIL) 10 MG tablet 1 tab po bid (Patient taking differently: Take 10 mg by mouth daily. ) 60 tablet 0  . timolol (BETIMOL) 0.5 % ophthalmic solution Place 1 drop into both eyes every morning.      . nitrofurantoin, macrocrystal-monohydrate, (MACROBID) 100 MG capsule Take 1 capsule (100 mg total) by mouth 2 (two) times daily. 8 capsule 0  . ondansetron (ZOFRAN) 4 MG tablet TAKE 1 TABLET EVERY 8 HOURS AS NEEDED FOR NAUSEA (Patient not taking: Reported on 06/23/2015) 90 tablet 0   No facility-administered medications prior to visit.    Allergies  Allergen Reactions  . Ciprofloxacin Hives    Given with 2 other meds/unsure if truly allergic  . Combigan [Brimonidine Tartrate-Timolol] Other (See Comments)    Inflamation  . Detrol [Tolterodine Tartrate] Hives    Given with 2 other medications/unsure if truly allergic  . Oxycodone Hives    Given with 2 other medications/unsure if truly  allergic  . Penicillins Hives, Itching and Rash  . Sulfa Antibiotics Hives, Itching and Rash    Review of Systems  Constitutional: Negative for fever, chills and malaise/fatigue.  HENT: Negative for congestion and hearing loss.   Eyes: Negative for discharge.  Respiratory: Negative for cough, sputum production and shortness of breath.   Cardiovascular: Negative for chest pain, palpitations and leg swelling.  Gastrointestinal: Negative for heartburn, nausea, vomiting, abdominal pain, diarrhea, constipation and blood in stool.  Genitourinary: Negative for dysuria, urgency, frequency and hematuria.  Musculoskeletal: Negative for myalgias, back pain and falls.  Skin: Negative for rash.  Neurological: Negative for dizziness, sensory change, loss of consciousness, weakness and headaches.  Endo/Heme/Allergies: Negative for environmental allergies. Does not bruise/bleed easily.  Psychiatric/Behavioral: Negative for depression and suicidal ideas. The patient is not nervous/anxious and does not have insomnia.        Objective:    Physical Exam  Constitutional: She is oriented to person, place, and time. She appears well-developed and well-nourished. No distress.  HENT:  Head: Normocephalic and atraumatic.  Eyes: Conjunctivae are normal.  Neck: Neck supple. No thyromegaly present.  Cardiovascular: Normal rate, regular rhythm and normal heart sounds.   No murmur heard. Pulmonary/Chest: Effort normal and breath sounds normal. No respiratory distress.  Abdominal: Soft. Bowel sounds are normal. She exhibits no distension and no mass. There is no tenderness.  Musculoskeletal: She exhibits no edema.  Lymphadenopathy:    She has no cervical adenopathy.  Neurological: She is alert and oriented to person, place, and time.  Skin: Skin is warm and dry.  Psychiatric: She has a normal mood and affect. Her behavior is normal.    BP 132/72 mmHg  Pulse 79  Temp(Src) 98.3 F (36.8 C) (Oral)  Ht 5'  5" (1.651 m)  Wt 126 lb 4 oz (57.267 kg)  BMI 21.01 kg/m2  SpO2 92% Wt Readings from Last 3 Encounters:  06/23/15 126 lb 4 oz (57.267 kg)  03/08/15 127 lb 13.9 oz (58 kg)  02/12/15 137 lb 6.4 oz (62.324 kg)     Lab Results  Component Value Date   WBC 8.4 06/23/2015   HGB 14.0 06/23/2015   HCT 43.5 06/23/2015   PLT 284.0 06/23/2015   GLUCOSE 103* 06/23/2015   CHOL 132 12/15/2014   TRIG 64.0 12/15/2014   HDL 46.10 12/15/2014   LDLCALC 73 12/15/2014   ALT 9 06/23/2015   AST 18 06/23/2015   NA 140 06/23/2015   K 4.1 06/23/2015   CL 105 06/23/2015   CREATININE  0.93 06/23/2015   BUN 13 06/23/2015   CO2 29 06/23/2015   TSH 3.37 06/23/2015   INR 0.95 01/30/2011   HGBA1C 5.9 12/15/2014    Lab Results  Component Value Date   TSH 3.37 06/23/2015   Lab Results  Component Value Date   WBC 8.4 06/23/2015   HGB 14.0 06/23/2015   HCT 43.5 06/23/2015   MCV 96.0 06/23/2015   PLT 284.0 06/23/2015   Lab Results  Component Value Date   NA 140 06/23/2015   K 4.1 06/23/2015   CO2 29 06/23/2015   GLUCOSE 103* 06/23/2015   BUN 13 06/23/2015   CREATININE 0.93 06/23/2015   BILITOT 0.4 06/23/2015   ALKPHOS 117 06/23/2015   AST 18 06/23/2015   ALT 9 06/23/2015   PROT 6.1 06/23/2015   ALBUMIN 3.6 06/23/2015   CALCIUM 9.0 06/23/2015   ANIONGAP 6 03/11/2015   GFR 60.69 06/23/2015   Lab Results  Component Value Date   CHOL 132 12/15/2014   Lab Results  Component Value Date   HDL 46.10 12/15/2014   Lab Results  Component Value Date   LDLCALC 73 12/15/2014   Lab Results  Component Value Date   TRIG 64.0 12/15/2014   Lab Results  Component Value Date   CHOLHDL 3 12/15/2014   Lab Results  Component Value Date   HGBA1C 5.9 12/15/2014       Assessment & Plan:   Problem List Items Addressed This Visit    Hyperglycemia    minimize simple carbs. Increase exercise as tolerated.       Hypertension - Primary    Well controlled, no changes to meds. Encouraged  heart healthy diet such as the DASH diet and exercise as tolerated.       Relevant Orders   Comprehensive metabolic panel (Completed)   CBC (Completed)   Medicare annual wellness visit, subsequent    Patient denies any difficulties at home. No trouble with ADLs, depression or falls. See EMR for functional status screen and depression screen. No recent changes to vision or hearing. Is UTD with immunizations. Is UTD with screening. Discussed Advanced Directives. Encouraged heart healthy diet, exercise as tolerated and adequate sleep. See patient's problem list for health risk factors to monitor. See AVS for preventative healthcare recommendation schedule. Labs ordered and reviewed Follows with Dr Hazle Quant of Opthamology Dr Pearlean Brownie of neurology Declines future colonoscopy, MGM or pap for purely screening purposes. Last MGM 2014 Last colonoscopy 2002      Thyroid disease   Relevant Orders   TSH (Completed)      I have discontinued Ms. Eddleman's ondansetron and nitrofurantoin (macrocrystal-monohydrate). I am also having her maintain her timolol, acetaminophen, latanoprost, levothyroxine, donepezil, dipyridamole-aspirin, lisinopril, amLODipine, citalopram, and gabapentin.  No orders of the defined types were placed in this encounter.     Reuel Derby, MD

## 2015-06-23 NOTE — Patient Instructions (Signed)
Preventive Care for Adults, Female A healthy lifestyle and preventive care can promote health and wellness. Preventive health guidelines for women include the following key practices.  A routine yearly physical is a good way to check with your health care provider about your health and preventive screening. It is a chance to share any concerns and updates on your health and to receive a thorough exam.  Visit your dentist for a routine exam and preventive care every 6 months. Brush your teeth twice a day and floss once a day. Good oral hygiene prevents tooth decay and gum disease.  The frequency of eye exams is based on your age, health, family medical history, use of contact lenses, and other factors. Follow your health care provider's recommendations for frequency of eye exams.  Eat a healthy diet. Foods like vegetables, fruits, whole grains, low-fat dairy products, and lean protein foods contain the nutrients you need without too many calories. Decrease your intake of foods high in solid fats, added sugars, and salt. Eat the right amount of calories for you.Get information about a proper diet from your health care provider, if necessary.  Regular physical exercise is one of the most important things you can do for your health. Most adults should get at least 150 minutes of moderate-intensity exercise (any activity that increases your heart rate and causes you to sweat) each week. In addition, most adults need muscle-strengthening exercises on 2 or more days a week.  Maintain a healthy weight. The body mass index (BMI) is a screening tool to identify possible weight problems. It provides an estimate of body fat based on height and weight. Your health care provider can find your BMI and can help you achieve or maintain a healthy weight.For adults 20 years and older:  A BMI below 18.5 is considered underweight.  A BMI of 18.5 to 24.9 is normal.  A BMI of 25 to 29.9 is considered overweight.  A  BMI of 30 and above is considered obese.  Maintain normal blood lipids and cholesterol levels by exercising and minimizing your intake of saturated fat. Eat a balanced diet with plenty of fruit and vegetables. Blood tests for lipids and cholesterol should begin at age 20 and be repeated every 5 years. If your lipid or cholesterol levels are high, you are over 50, or you are at high risk for heart disease, you may need your cholesterol levels checked more frequently.Ongoing high lipid and cholesterol levels should be treated with medicines if diet and exercise are not working.  If you smoke, find out from your health care provider how to quit. If you do not use tobacco, do not start.  Lung cancer screening is recommended for adults aged 55-80 years who are at high risk for developing lung cancer because of a history of smoking. A yearly low-dose CT scan of the lungs is recommended for people who have at least a 30-pack-year history of smoking and are a current smoker or have quit within the past 15 years. A pack year of smoking is smoking an average of 1 pack of cigarettes a day for 1 year (for example: 1 pack a day for 30 years or 2 packs a day for 15 years). Yearly screening should continue until the smoker has stopped smoking for at least 15 years. Yearly screening should be stopped for people who develop a health problem that would prevent them from having lung cancer treatment.  If you are pregnant, do not drink alcohol. If you are   are breastfeeding, be very cautious about drinking alcohol. If you are not pregnant and choose to drink alcohol, do not have more than 1 drink per day. One drink is considered to be 12 ounces (355 mL) of beer, 5 ounces (148 mL) of wine, or 1.5 ounces (44 mL) of liquor.  Avoid use of street drugs. Do not share needles with anyone. Ask for help if you need support or instructions about stopping the use of drugs.  High blood pressure causes heart disease and  increases the risk of stroke. Your blood pressure should be checked at least every 1 to 2 years. Ongoing high blood pressure should be treated with medicines if weight loss and exercise do not work.  If you are 12-58 years old, ask your health care provider if you should take aspirin to prevent strokes.  Diabetes screening is done by taking a blood sample to check your blood glucose level after you have not eaten for a certain period of time (fasting). If you are not overweight and you do not have risk factors for diabetes, you should be screened once every 3 years starting at age 76. If you are overweight or obese and you are 18-1 years of age, you should be screened for diabetes every year as part of your cardiovascular risk assessment.  Breast cancer screening is essential preventive care for women. You should practice "breast self-awareness." This means understanding the normal appearance and feel of your breasts and may include breast self-examination. Any changes detected, no matter how small, should be reported to a health care provider. Women in their 73s and 30s should have a clinical breast exam (CBE) by a health care provider as part of a regular health exam every 1 to 3 years. After age 36, women should have a CBE every year. Starting at age 50, women should consider having a mammogram (breast X-ray test) every year. Women who have a family history of breast cancer should talk to their health care provider about genetic screening. Women at a high risk of breast cancer should talk to their health care providers about having an MRI and a mammogram every year.  Breast cancer gene (BRCA)-related cancer risk assessment is recommended for women who have family members with BRCA-related cancers. BRCA-related cancers include breast, ovarian, tubal, and peritoneal cancers. Having family members with these cancers may be associated with an increased risk for harmful changes (mutations) in the breast  cancer genes BRCA1 and BRCA2. Results of the assessment will determine the need for genetic counseling and BRCA1 and BRCA2 testing.  Your health care provider may recommend that you be screened regularly for cancer of the pelvic organs (ovaries, uterus, and vagina). This screening involves a pelvic examination, including checking for microscopic changes to the surface of your cervix (Pap test). You may be encouraged to have this screening done every 3 years, beginning at age 40.  For women ages 32-65, health care providers may recommend pelvic exams and Pap testing every 3 years, or they may recommend the Pap and pelvic exam, combined with testing for human papilloma virus (HPV), every 5 years. Some types of HPV increase your risk of cervical cancer. Testing for HPV may also be done on women of any age with unclear Pap test results.  Other health care providers may not recommend any screening for nonpregnant women who are considered low risk for pelvic cancer and who do not have symptoms. Ask your health care provider if a screening pelvic exam is right  you.  If you have had past treatment for cervical cancer or a condition that could lead to cancer, you need Pap tests and screening for cancer for at least 20 years after your treatment. If Pap tests have been discontinued, your risk factors (such as having a new sexual partner) need to be reassessed to determine if screening should resume. Some women have medical problems that increase the chance of getting cervical cancer. In these cases, your health care provider may recommend more frequent screening and Pap tests.  Colorectal cancer can be detected and often prevented. Most routine colorectal cancer screening begins at the age of 50 years and continues through age 75 years. However, your health care provider may recommend screening at an earlier age if you have risk factors for colon cancer. On a yearly basis, your health care provider may provide home test kits to check  for hidden blood in the stool. Use of a small camera at the end of a tube, to directly examine the colon (sigmoidoscopy or colonoscopy), can detect the earliest forms of colorectal cancer. Talk to your health care provider about this at age 50, when routine screening begins. Direct exam of the colon should be repeated every 5-10 years through age 75 years, unless early forms of precancerous polyps or small growths are found.  People who are at an increased risk for hepatitis B should be screened for this virus. You are considered at high risk for hepatitis B if:  You were born in a country where hepatitis B occurs often. Talk with your health care provider about which countries are considered high risk.  Your parents were born in a high-risk country and you have not received a shot to protect against hepatitis B (hepatitis B vaccine).  You have HIV or AIDS.  You use needles to inject street drugs.  You live with, or have sex with, someone who has hepatitis B.  You get hemodialysis treatment.  You take certain medicines for conditions like cancer, organ transplantation, and autoimmune conditions.  Hepatitis C blood testing is recommended for all people born from 1945 through 1965 and any individual with known risks for hepatitis C.  Practice safe sex. Use condoms and avoid high-risk sexual practices to reduce the spread of sexually transmitted infections (STIs). STIs include gonorrhea, chlamydia, syphilis, trichomonas, herpes, HPV, and human immunodeficiency virus (HIV). Herpes, HIV, and HPV are viral illnesses that have no cure. They can result in disability, cancer, and death.  You should be screened for sexually transmitted illnesses (STIs) including gonorrhea and chlamydia if:  You are sexually active and are younger than 24 years.  You are older than 24 years and your health care provider tells you that you are at risk for this type of infection.  Your sexual activity has changed  since you were last screened and you are at an increased risk for chlamydia or gonorrhea. Ask your health care provider if you are at risk.  If you are at risk of being infected with HIV, it is recommended that you take a prescription medicine daily to prevent HIV infection. This is called preexposure prophylaxis (PrEP). You are considered at risk if:  You are sexually active and do not regularly use condoms or know the HIV status of your partner(s).  You take drugs by injection.  You are sexually active with a partner who has HIV.  Talk with your health care provider about whether you are at high risk of being infected with HIV. If   you choose to begin PrEP, you should first be tested for HIV. You should then be tested every 3 months for as long as you are taking PrEP.  Osteoporosis is a disease in which the bones lose minerals and strength with aging. This can result in serious bone fractures or breaks. The risk of osteoporosis can be identified using a bone density scan. Women ages 65 years and over and women at risk for fractures or osteoporosis should discuss screening with their health care providers. Ask your health care provider whether you should take a calcium supplement or vitamin D to reduce the rate of osteoporosis.  Menopause can be associated with physical symptoms and risks. Hormone replacement therapy is available to decrease symptoms and risks. You should talk to your health care provider about whether hormone replacement therapy is right for you.  Use sunscreen. Apply sunscreen liberally and repeatedly throughout the day. You should seek shade when your shadow is shorter than you. Protect yourself by wearing long sleeves, pants, a wide-brimmed hat, and sunglasses year round, whenever you are outdoors.  Once a month, do a whole body skin exam, using a mirror to look at the skin on your back. Tell your health care provider of new moles, moles that have irregular borders, moles that  are larger than a pencil eraser, or moles that have changed in shape or color.  Stay current with required vaccines (immunizations).  Influenza vaccine. All adults should be immunized every year.  Tetanus, diphtheria, and acellular pertussis (Td, Tdap) vaccine. Pregnant women should receive 1 dose of Tdap vaccine during each pregnancy. The dose should be obtained regardless of the length of time since the last dose. Immunization is preferred during the 27th-36th week of gestation. An adult who has not previously received Tdap or who does not know her vaccine status should receive 1 dose of Tdap. This initial dose should be followed by tetanus and diphtheria toxoids (Td) booster doses every 10 years. Adults with an unknown or incomplete history of completing a 3-dose immunization series with Td-containing vaccines should begin or complete a primary immunization series including a Tdap dose. Adults should receive a Td booster every 10 years.  Varicella vaccine. An adult without evidence of immunity to varicella should receive 2 doses or a second dose if she has previously received 1 dose. Pregnant females who do not have evidence of immunity should receive the first dose after pregnancy. This first dose should be obtained before leaving the health care facility. The second dose should be obtained 4-8 weeks after the first dose.  Human papillomavirus (HPV) vaccine. Females aged 13-26 years who have not received the vaccine previously should obtain the 3-dose series. The vaccine is not recommended for use in pregnant females. However, pregnancy testing is not needed before receiving a dose. If a female is found to be pregnant after receiving a dose, no treatment is needed. In that case, the remaining doses should be delayed until after the pregnancy. Immunization is recommended for any person with an immunocompromised condition through the age of 26 years if she did not get any or all doses earlier. During the  3-dose series, the second dose should be obtained 4-8 weeks after the first dose. The third dose should be obtained 24 weeks after the first dose and 16 weeks after the second dose.  Zoster vaccine. One dose is recommended for adults aged 60 years or older unless certain conditions are present.  Measles, mumps, and rubella (MMR) vaccine. Adults born   born before [birth year] generally are considered immune to measles and mumps. Adults born in [birth year] or later should have 1 or more doses of MMR vaccine unless there is a contraindication to the vaccine or there is laboratory evidence of immunity to each of the three diseases. A routine second dose of MMR vaccine should be obtained at least 28 days after the first dose for students attending postsecondary schools, health care workers, or international travelers. People who received inactivated measles vaccine or an unknown type of measles vaccine during 1963-1967 should receive 2 doses of MMR vaccine. People who received inactivated mumps vaccine or an unknown type of mumps vaccine before 1979 and are at high risk for mumps infection should consider immunization with 2 doses of MMR vaccine. For females of childbearing age, rubella immunity should be determined. If there is no evidence of immunity, females who are not pregnant should be vaccinated. If there is no evidence of immunity, females who are pregnant should delay immunization until after pregnancy. Unvaccinated health care workers born before [birth year] who lack laboratory evidence of measles, mumps, or rubella immunity or laboratory confirmation of disease should consider measles and mumps immunization with 2 doses of MMR vaccine or rubella immunization with 1 dose of MMR vaccine.  Pneumococcal 13-valent conjugate (PCV13) vaccine. When indicated, a person who is uncertain of his immunization history and has no record of immunization should receive the PCV13 vaccine. All adults 62 years of age and older  should receive this vaccine. An adult aged 39 years or older who has certain medical conditions and has not been previously immunized should receive 1 dose of PCV13 vaccine. This PCV13 should be followed with a dose of pneumococcal polysaccharide (PPSV23) vaccine. Adults who are at high risk for pneumococcal disease should obtain the PPSV23 vaccine at least 8 weeks after the dose of PCV13 vaccine. Adults older than 80 years of age who have normal immune system function should obtain the PPSV23 vaccine dose at least 1 year after the dose of PCV13 vaccine.  Pneumococcal polysaccharide (PPSV23) vaccine. When PCV13 is also indicated, PCV13 should be obtained first. All adults aged 47 years and older should be immunized. An adult younger than age 27 years who has certain medical conditions should be immunized. Any person who resides in a nursing home or long-term care facility should be immunized. An adult smoker should be immunized. People with an immunocompromised condition and certain other conditions should receive both PCV13 and PPSV23 vaccines. People with human immunodeficiency virus (HIV) infection should be immunized as soon as possible after diagnosis. Immunization during chemotherapy or radiation therapy should be avoided. Routine use of PPSV23 vaccine is not recommended for American Indians, [place] Natives, or people younger than 65 years unless there are medical conditions that require PPSV23 vaccine. When indicated, people who have unknown immunization and have no record of immunization should receive PPSV23 vaccine. One-time revaccination 5 years after the first dose of PPSV23 is recommended for people aged 19-64 years who have chronic kidney failure, nephrotic syndrome, asplenia, or immunocompromised conditions. People who received 1-2 doses of PPSV23 before age 57 years should receive another dose of PPSV23 vaccine at age 42 years or later if at least 5 years have passed since the previous dose. Doses  of PPSV23 are not needed for people immunized with PPSV23 at or after age 51 years.  Meningococcal vaccine. Adults with asplenia or persistent complement component deficiencies should receive 2 doses of quadrivalent meningococcal conjugate (MenACWY-D) vaccine. The doses should be  at least 2 months apart. Microbiologists working with certain meningococcal bacteria, military recruits, people at risk during an outbreak, and people who travel to or live in countries with a high rate of meningitis should be immunized. A first-year college student up through age 21 years who is living in a residence hall should receive a dose if she did not receive a dose on or after her 16th birthday. Adults who have certain high-risk conditions should receive one or more doses of vaccine.  Hepatitis A vaccine. Adults who wish to be protected from this disease, have certain high-risk conditions, work with hepatitis A-infected animals, work in hepatitis A research labs, or travel to or work in countries with a high rate of hepatitis A should be immunized. Adults who were previously unvaccinated and who anticipate close contact with an international adoptee during the first 60 days after arrival in the [place] from a country with a high rate of hepatitis A should be immunized.  Hepatitis B vaccine. Adults who wish to be protected from this disease, have certain high-risk conditions, may be exposed to blood or other infectious body fluids, are household contacts or sex partners of hepatitis B positive people, are clients or workers in certain care facilities, or travel to or work in countries with a high rate of hepatitis B should be immunized.  Haemophilus influenzae type b (Hib) vaccine. A previously unvaccinated person with asplenia or sickle cell disease or having a scheduled splenectomy should receive 1 dose of Hib vaccine. Regardless of previous immunization, a recipient of a hematopoietic stem cell transplant should receive a  3-dose series 6-12 months after her successful transplant. Hib vaccine is not recommended for adults with HIV infection. Preventive Services / Frequency Ages 19 to 39 years  Blood pressure check.** / Every 3-5 years.  Lipid and cholesterol check.** / Every 5 years beginning at age 20.  Clinical breast exam.** / Every 3 years for women in their 20s and 30s.  BRCA-related cancer risk assessment.** / For women who have family members with a BRCA-related cancer (breast, ovarian, tubal, or peritoneal cancers).  Pap test.** / Every 2 years from ages 21 through 29. Every 3 years starting at age 30 through age 65 or 70 with a history of 3 consecutive normal Pap tests.  HPV screening.** / Every 3 years from ages 30 through ages 65 to 70 with a history of 3 consecutive normal Pap tests.  Hepatitis C blood test.** / For any individual with known risks for hepatitis C.  Skin self-exam. / Monthly.  Influenza vaccine. / Every year.  Tetanus, diphtheria, and acellular pertussis (Tdap, Td) vaccine.** / Consult your health care provider. Pregnant women should receive 1 dose of Tdap vaccine during each pregnancy. 1 dose of Td every 10 years.  Varicella vaccine.** / Consult your health care provider. Pregnant females who do not have evidence of immunity should receive the first dose after pregnancy.  HPV vaccine. / 3 doses over 6 months, if 26 and younger. The vaccine is not recommended for use in pregnant females. However, pregnancy testing is not needed before receiving a dose.  Measles, mumps, rubella (MMR) vaccine.** / You need at least 1 dose of MMR if you were born in 1957 or later. You may also need a 2nd dose. For females of childbearing age, rubella immunity should be determined. If there is no evidence of immunity, females who are not pregnant should be vaccinated. If there is no evidence of immunity, females who are   pregnant should delay immunization until after pregnancy.  Pneumococcal  13-valent conjugate (PCV13) vaccine.** / Consult your health care provider.  Pneumococcal polysaccharide (PPSV23) vaccine.** / 1 to 2 doses if you smoke cigarettes or if you have certain conditions.  Meningococcal vaccine.** / 1 dose if you are age 19 to 21 years and a first-year college student living in a residence hall, or have one of several medical conditions, you need to get vaccinated against meningococcal disease. You may also need additional booster doses.  Hepatitis A vaccine.** / Consult your health care provider.  Hepatitis B vaccine.** / Consult your health care provider.  Haemophilus influenzae type b (Hib) vaccine.** / Consult your health care provider. Ages 40 to 64 years  Blood pressure check.** / Every year.  Lipid and cholesterol check.** / Every 5 years beginning at age 20 years.  Lung cancer screening. / Every year if you are aged 55-80 years and have a 30-pack-year history of smoking and currently smoke or have quit within the past 15 years. Yearly screening is stopped once you have quit smoking for at least 15 years or develop a health problem that would prevent you from having lung cancer treatment.  Clinical breast exam.** / Every year after age 40 years.  BRCA-related cancer risk assessment.** / For women who have family members with a BRCA-related cancer (breast, ovarian, tubal, or peritoneal cancers).  Mammogram.** / Every year beginning at age 40 years and continuing for as long as you are in good health. Consult with your health care provider.  Pap test.** / Every 3 years starting at age 30 years through age 65 or 70 years with a history of 3 consecutive normal Pap tests.  HPV screening.** / Every 3 years from ages 30 years through ages 65 to 70 years with a history of 3 consecutive normal Pap tests.  Fecal occult blood test (FOBT) of stool. / Every year beginning at age 50 years and continuing until age 75 years. You may not need to do this test if you get  a colonoscopy every 10 years.  Flexible sigmoidoscopy or colonoscopy.** / Every 5 years for a flexible sigmoidoscopy or every 10 years for a colonoscopy beginning at age 50 years and continuing until age 75 years.  Hepatitis C blood test.** / For all people born from 1945 through 1965 and any individual with known risks for hepatitis C.  Skin self-exam. / Monthly.  Influenza vaccine. / Every year.  Tetanus, diphtheria, and acellular pertussis (Tdap/Td) vaccine.** / Consult your health care provider. Pregnant women should receive 1 dose of Tdap vaccine during each pregnancy. 1 dose of Td every 10 years.  Varicella vaccine.** / Consult your health care provider. Pregnant females who do not have evidence of immunity should receive the first dose after pregnancy.  Zoster vaccine.** / 1 dose for adults aged 60 years or older.  Measles, mumps, rubella (MMR) vaccine.** / You need at least 1 dose of MMR if you were born in 1957 or later. You may also need a second dose. For females of childbearing age, rubella immunity should be determined. If there is no evidence of immunity, females who are not pregnant should be vaccinated. If there is no evidence of immunity, females who are pregnant should delay immunization until after pregnancy.  Pneumococcal 13-valent conjugate (PCV13) vaccine.** / Consult your health care provider.  Pneumococcal polysaccharide (PPSV23) vaccine.** / 1 to 2 doses if you smoke cigarettes or if you have certain conditions.  Meningococcal vaccine.** /   Consult your health care provider.  Hepatitis A vaccine.** / Consult your health care provider.  Hepatitis B vaccine.** / Consult your health care provider.  Haemophilus influenzae type b (Hib) vaccine.** / Consult your health care provider. Ages 80 years and over  Blood pressure check.** / Every year.  Lipid and cholesterol check.** / Every 5 years beginning at age 62 years.  Lung cancer  screening. / Every year if you are aged 32-80 years and have a 30-pack-year history of smoking and currently smoke or have quit within the past 15 years. Yearly screening is stopped once you have quit smoking for at least 15 years or develop a health problem that would prevent you from having lung cancer treatment.  Clinical breast exam.** / Every year after age 61 years.  BRCA-related cancer risk assessment.** / For women who have family members with a BRCA-related cancer (breast, ovarian, tubal, or peritoneal cancers).  Mammogram.** / Every year beginning at age 39 years and continuing for as long as you are in good health. Consult with your health care provider.  Pap test.** / Every 3 years starting at age 85 years through age 74 or 72 years with 3 consecutive normal Pap tests. Testing can be stopped between 65 and 70 years with 3 consecutive normal Pap tests and no abnormal Pap or HPV tests in the past 10 years.  HPV screening.** / Every 3 years from ages 55 years through ages 67 or 77 years with a history of 3 consecutive normal Pap tests. Testing can be stopped between 65 and 70 years with 3 consecutive normal Pap tests and no abnormal Pap or HPV tests in the past 10 years.  Fecal occult blood test (FOBT) of stool. / Every year beginning at age 81 years and continuing until age 22 years. You may not need to do this test if you get a colonoscopy every 10 years.  Flexible sigmoidoscopy or colonoscopy.** / Every 5 years for a flexible sigmoidoscopy or every 10 years for a colonoscopy beginning at age 67 years and continuing until age 22 years.  Hepatitis C blood test.** / For all people born from [birth year] through 1963 and any individual with known risks for hepatitis C.  Osteoporosis screening.** / A one-time screening for women ages 8 years and over and women at risk for fractures or osteoporosis.  Skin self-exam. / Monthly.  Influenza vaccine. / Every year.  Tetanus, diphtheria, and  acellular pertussis (Tdap/Td) vaccine.** / 1 dose of Td every 10 years.  Varicella vaccine.** / Consult your health care provider.  Zoster vaccine.** / 1 dose for adults aged 56 years or older.  Pneumococcal 13-valent conjugate (PCV13) vaccine.** / Consult your health care provider.  Pneumococcal polysaccharide (PPSV23) vaccine.** / 1 dose for all adults aged 15 years and older.  Meningococcal vaccine.** / Consult your health care provider.  Hepatitis A vaccine.** / Consult your health care provider.  Hepatitis B vaccine.** / Consult your health care provider.  Haemophilus influenzae type b (Hib) vaccine.** / Consult your health care provider. ** Family history and personal history of risk and conditions may change your health care provider's recommendations.   This information is not intended to replace advice given to you by your health care provider. Make sure you discuss any questions you have with your health care provider.   Document Released: 07/12/2001 Document Revised: 06/06/2014 Document Reviewed: 10/11/2010 Elsevier Interactive Patient Education [employer].

## 2015-06-23 NOTE — Progress Notes (Signed)
   Subjective:    Patient ID: Darlene Wilson, female    DOB: Jul 28, 1926, 82 y.o.   MRN: [id number]  HPI    Review of Systems     Objective:   Physical Exam        Assessment & Plan:

## 2015-06-24 ENCOUNTER — Telehealth: Payer: Self-pay | Admitting: *Deleted

## 2015-06-24 LAB — CBC
HCT: 43.5 % (ref 36.0–46.0)
HEMOGLOBIN: 14 g/dL (ref 12.0–15.0)
MCHC: 32.2 g/dL (ref 30.0–36.0)
MCV: 96 fl (ref 78.0–100.0)
PLATELETS: 284 10*3/uL (ref 150.0–400.0)
RBC: 4.53 Mil/uL (ref 3.87–5.11)
RDW: 15.5 % (ref 11.5–15.5)
WBC: 8.4 10*3/uL (ref 4.0–10.5)

## 2015-06-24 LAB — COMPREHENSIVE METABOLIC PANEL
ALBUMIN: 3.6 g/dL (ref 3.5–5.2)
ALK PHOS: 117 U/L (ref 39–117)
ALT: 9 U/L (ref 0–35)
AST: 18 U/L (ref 0–37)
BUN: 13 mg/dL (ref 6–23)
CALCIUM: 9 mg/dL (ref 8.4–10.5)
CO2: 29 mEq/L (ref 19–32)
Chloride: 105 mEq/L (ref 96–112)
Creatinine, Ser: 0.93 mg/dL (ref 0.40–1.20)
GFR: 60.69 mL/min (ref >60.00)
Glucose, Bld: 103 mg/dL — ABNORMAL HIGH (ref 70–99)
POTASSIUM: 4.1 meq/L (ref 3.5–5.1)
Sodium: 140 mEq/L (ref 135–145)
TOTAL PROTEIN: 6.1 g/dL (ref 6.0–8.3)
Total Bilirubin: 0.4 mg/dL (ref 0.2–1.2)

## 2015-06-24 LAB — TSH: TSH: 3.37 u[IU]/mL (ref 0.35–4.50)

## 2015-06-24 NOTE — Telephone Encounter (Signed)
Received PT Plan of Care from [facility]; forwarded to provider/SLS 01/25

## 2015-06-25 DIAGNOSIS — R2689 Other abnormalities of gait and mobility: Secondary | ICD-10-CM | POA: Diagnosis not present

## 2015-06-25 DIAGNOSIS — M6281 Muscle weakness (generalized): Secondary | ICD-10-CM | POA: Diagnosis not present

## 2015-06-25 DIAGNOSIS — M545 Low back pain: Secondary | ICD-10-CM | POA: Diagnosis not present

## 2015-06-25 DIAGNOSIS — R296 Repeated falls: Secondary | ICD-10-CM | POA: Diagnosis not present

## 2015-06-25 DIAGNOSIS — R29898 Other symptoms and signs involving the musculoskeletal system: Secondary | ICD-10-CM | POA: Diagnosis not present

## 2015-06-25 DIAGNOSIS — R2681 Unsteadiness on feet: Secondary | ICD-10-CM | POA: Diagnosis not present

## 2015-06-30 DIAGNOSIS — M545 Low back pain: Secondary | ICD-10-CM | POA: Diagnosis not present

## 2015-06-30 DIAGNOSIS — M6281 Muscle weakness (generalized): Secondary | ICD-10-CM | POA: Diagnosis not present

## 2015-06-30 DIAGNOSIS — R29898 Other symptoms and signs involving the musculoskeletal system: Secondary | ICD-10-CM | POA: Diagnosis not present

## 2015-06-30 DIAGNOSIS — R2689 Other abnormalities of gait and mobility: Secondary | ICD-10-CM | POA: Diagnosis not present

## 2015-06-30 DIAGNOSIS — R296 Repeated falls: Secondary | ICD-10-CM | POA: Diagnosis not present

## 2015-06-30 DIAGNOSIS — R2681 Unsteadiness on feet: Secondary | ICD-10-CM | POA: Diagnosis not present

## 2015-07-01 DIAGNOSIS — R296 Repeated falls: Secondary | ICD-10-CM | POA: Diagnosis not present

## 2015-07-01 DIAGNOSIS — R29898 Other symptoms and signs involving the musculoskeletal system: Secondary | ICD-10-CM | POA: Diagnosis not present

## 2015-07-01 DIAGNOSIS — R2689 Other abnormalities of gait and mobility: Secondary | ICD-10-CM | POA: Diagnosis not present

## 2015-07-01 DIAGNOSIS — R2681 Unsteadiness on feet: Secondary | ICD-10-CM | POA: Diagnosis not present

## 2015-07-01 DIAGNOSIS — M545 Low back pain: Secondary | ICD-10-CM | POA: Diagnosis not present

## 2015-07-01 DIAGNOSIS — R488 Other symbolic dysfunctions: Secondary | ICD-10-CM | POA: Diagnosis not present

## 2015-07-01 DIAGNOSIS — M6281 Muscle weakness (generalized): Secondary | ICD-10-CM | POA: Diagnosis not present

## 2015-07-03 DIAGNOSIS — M545 Low back pain: Secondary | ICD-10-CM | POA: Diagnosis not present

## 2015-07-03 DIAGNOSIS — R29898 Other symptoms and signs involving the musculoskeletal system: Secondary | ICD-10-CM | POA: Diagnosis not present

## 2015-07-03 DIAGNOSIS — R2681 Unsteadiness on feet: Secondary | ICD-10-CM | POA: Diagnosis not present

## 2015-07-03 DIAGNOSIS — M6281 Muscle weakness (generalized): Secondary | ICD-10-CM | POA: Diagnosis not present

## 2015-07-03 DIAGNOSIS — R296 Repeated falls: Secondary | ICD-10-CM | POA: Diagnosis not present

## 2015-07-03 DIAGNOSIS — R2689 Other abnormalities of gait and mobility: Secondary | ICD-10-CM | POA: Diagnosis not present

## 2015-07-05 ENCOUNTER — Encounter: Payer: Self-pay | Admitting: Family Medicine

## 2015-07-05 NOTE — Assessment & Plan Note (Signed)
Well controlled, no changes to meds. Encouraged heart healthy diet such as the DASH diet and exercise as tolerated.  °

## 2015-07-05 NOTE — Assessment & Plan Note (Signed)
Patient denies any difficulties at home. No trouble with ADLs, depression or falls. See EMR for functional status screen and depression screen. No recent changes to vision or hearing. Is UTD with immunizations. Is UTD with screening. Discussed Advanced Directives. Encouraged heart healthy diet, exercise as tolerated and adequate sleep. See patient's problem list for health risk factors to monitor. See AVS for preventative healthcare recommendation schedule. Labs ordered and reviewed Follows with Dr Hazle Quant of Opthamology Dr Pearlean Brownie of neurology Declines future colonoscopy, MGM or pap for purely screening purposes. Last MGM 2014 Last colonoscopy 2002

## 2015-07-05 NOTE — Assessment & Plan Note (Signed)
minimize simple carbs. Increase exercise as tolerated.  

## 2015-07-07 DIAGNOSIS — R296 Repeated falls: Secondary | ICD-10-CM | POA: Diagnosis not present

## 2015-07-07 DIAGNOSIS — M545 Low back pain: Secondary | ICD-10-CM | POA: Diagnosis not present

## 2015-07-07 DIAGNOSIS — R2689 Other abnormalities of gait and mobility: Secondary | ICD-10-CM | POA: Diagnosis not present

## 2015-07-07 DIAGNOSIS — R29898 Other symptoms and signs involving the musculoskeletal system: Secondary | ICD-10-CM | POA: Diagnosis not present

## 2015-07-07 DIAGNOSIS — M6281 Muscle weakness (generalized): Secondary | ICD-10-CM | POA: Diagnosis not present

## 2015-07-07 DIAGNOSIS — R2681 Unsteadiness on feet: Secondary | ICD-10-CM | POA: Diagnosis not present

## 2015-08-12 ENCOUNTER — Encounter: Payer: Self-pay | Admitting: Neurology

## 2015-08-12 ENCOUNTER — Ambulatory Visit (INDEPENDENT_AMBULATORY_CARE_PROVIDER_SITE_OTHER): Payer: Medicare Other | Admitting: Neurology

## 2015-08-12 VITALS — BP 115/67 | HR 76 | Ht 65.0 in | Wt 128.4 lb

## 2015-08-12 DIAGNOSIS — F0390 Unspecified dementia without behavioral disturbance: Secondary | ICD-10-CM

## 2015-08-12 NOTE — Progress Notes (Signed)
Marland Kitchen.    PATIENT: Darlene SchmidtJanie B Wilson DOB: 1927/02/12  REASON FOR VISIT: follow up HISTORY FROM: patient  HISTORY OF PRESENT ILLNESS: Ms. Darlene Wilson, 82 year old white female returns today for followup. She was last seen in this office 10/12/11 by Dr. Pearlean BrownieSethi. She has a history of TIA and is on Aggrenox. She has a history of 3 TIAs in the past. She went to the emergency room on Oct 14, 2009 for some weakness, numbness in her lip and tingling in the feet, it was characterized as generalized weakness. Her lab values were normal. MRI of the brain was found to be negative for any acute event. Mild to moderate small vessel disease changes. Vascular risk factors include hypertension and history of TIA's in addition to a history of hypothyroidism  Reports an episode of dizziness where she felt like the room was spinning around that lasted approximately 2-3 hours, she was [facility] on 08/12/11 and was diagnosed with vertigo. She was prescribed Antivert but has not used. She did take Dramamine the first day with relief. CT scan of head showed no acute finding and atrophy and chronic microvascular ischemic change. Cholesterol levels were checked by her PCP and report they were normal, I do not have the results today. Denies any falls, ambulates with cane. Denies any new neurologic symptoms. Has right foot numbness since her stroke. Taking gabapentin Continues to exercise routinely. Blood pressure today is 120/60. Reports a fullness in her head, will take Tylenol with relief.  05/09/12: Returns for followup with daughter. No further stroke or TIA symptoms. Taking aggrenox without significant bruising. Parathesias not completely controlled with low dose Gabapentin. Continues to exercise several times weekly. No new complaints   UPDATE 01/30/13 (LL):  Patient returns for follow up visit, no new physical complaints. Paresthesias under control. Continues twice daily Aggrenox for stroke prevention, denies side  effects or significant bruising. States that she is having some short term memory problems. Daughter that accompanies her shakes her head in agreement when her mother isn't looking.  UPDATE 08/01/13 (LL):  Patient returns for follow up visit, no new physical complaints.  She was started on Donepizil by Dr. Rogelia RohrerBlythe, taking 10 mg daily and tolerating it well.  Continues twice daily Aggrenox for stroke prevention, denies side effects or significant bruising.  Continues to exercise several times weekly, BP is well controlled, it is 126/74 in office today.  UPDATE 02/11/14 : (PS) : She returns for followup after last visit 6 months ago. She is accompanied by her daughter. Patient remains independent in activities of daily living except the daughter and was her finances and ranges from medications. She does occasionally get confused about taking her medicines. There have been no safety concerns, falls, agitation, but hallucinations delusions or behavioral concerns. She has not had a recurrent stroke or TIAs. She is tolerating Aggrenox well without side effects. Her blood pressure is usually well controlled though today it is slightly elevated at 136/71. She had last lipid profile checked 6 months ago by primary physician and was excellent. Patient was started on Aricept following the last visit and she is tolerating it well without side effects. Her Mini-Mental status exam testing score today is  22/30 today which is improved from 19/30 from last visit. Update 02/13/2015 : She returns for follow-up after last visit a year ago. She is accompanied by her daughter Darlene Wilson. Patient has since moved into assisted living as the family is concerned as she left the burner on  for the store couple of occasions. She continues to have poor short-term memory. Medications and I'll supervise in assisted living. She's not had any recurrent stroke or TIA symptoms. She did have significant blood pressure elevation a few months ago and her  blood pressure medications were increased and today it's doing much better at 127/70. She's had no recent falls. She does not have any hallucinations delusions or behavioral agitation issues. She had follow-up carotid ultrasound done on 02/19/14 which showed minor plaques bilaterally without hemodynamically significant stenosis. She remains on Aggrenox which is tolerating well without side effects  Update 08/12/2015: She returns for follow-up after last visit 6 months ago. She is accompanied by her daughter-in-law Darlene Wilson today. [facility] and is well settled there. She is independent with her personal hygiene and needs only help with washing her clothes and preparing her meals. She is quite active and does participate in several activities that. She has not had any unsafe behavior, falls, delusions, hallucinations. She remains on Aggrenox which is tolerating well as well as Aricept 10 mg daily. The patient did not do well on cognitive testing today and scored only 15/30 compared to 21/30 at last visit but patient and daughter-in-law feel that she is just had a bad day today. They are reluctant in considering increasing the dose of Aricept or adding Namenda at the current time REVIEW OF SYSTEMS: Full 14 system review of systems performed and notable only for:  Memory loss, gait difficulties, confusion and all other systems negative  ALLERGIES: Allergies  Allergen Reactions  . Ciprofloxacin Hives    Given with 2 other meds/unsure if truly allergic  . Combigan [Brimonidine Tartrate-Timolol] Other (See Comments)    Inflamation  . Detrol [Tolterodine Tartrate] Hives    Given with 2 other medications/unsure if truly allergic  . Oxycodone Hives    Given with 2 other medications/unsure if truly allergic  . Penicillins Hives, Itching and Rash  . Sulfa Antibiotics Hives, Itching and Rash    HOME MEDICATIONS: Outpatient Prescriptions Prior to Visit    Medication Sig Dispense Refill  . acetaminophen (TYLENOL) 325 MG tablet Take 2 tablets (650 mg total) by mouth every 6 (six) hours as needed.    Marland Kitchen. amLODipine (NORVASC) 5 MG tablet Take 1 tablet (5 mg total) by mouth daily. 30 tablet 6  . citalopram (CELEXA) 10 MG tablet Take 1 tablet (10 mg total) by mouth daily. 90 tablet 2  . dipyridamole-aspirin (AGGRENOX) 200-25 MG per 12 hr capsule TAKE 1 CAPSULE BY MOUTH 2 (TWO) TIMES DAILY. 180 capsule 2  . donepezil (ARICEPT) 10 MG tablet Take 1 tablet (10 mg total) by mouth at bedtime. 90 tablet 1  . gabapentin (NEURONTIN) 100 MG capsule TAKE 1 CAPSULE (100 MG TOTAL) BY MOUTH AT BEDTIME. 90 capsule 1  . latanoprost (XALATAN) 0.005 % ophthalmic solution Place 1 drop into both eyes at bedtime. 2.5 mL 0  . levothyroxine (SYNTHROID, LEVOTHROID) 75 MCG tablet Take 1 tablet (75 mcg total) by mouth daily. 90 tablet 1  . lisinopril (PRINIVIL,ZESTRIL) 10 MG tablet 1 tab po bid (Patient taking differently: Take 10 mg by mouth daily. ) 60 tablet 0  . timolol (BETIMOL) 0.5 % ophthalmic solution Place 1 drop into both eyes every morning.       No facility-administered medications prior to visit.     PHYSICAL EXAM  Filed Vitals:   08/12/15 1503  BP: 115/67  Pulse: 76  Height: 5'  5" (1.651 m)  Weight: 128 lb 6.4 oz (58.242 kg)   Body mass index is 21.37 kg/(m^2).  Generalized: In no acute distress, pleasant frail elderly Caucasian female  Neck: Supple, no carotid bruits  Cardiac: Regular rate rhythm, no murmur Skin: Few petechiae   Neurologic Exam  Mental Status: Awake and fully alert. Follows 1,2 step commands. Mood and affect appropriate. No aphasia, apraxia or dysarthia.  MMSE 15/30 (21/30 last visit) WITH DEFICITS IN ORIENTATION  ATTENTION AND CALCULATION, DELAYED RECALL 0/3. CLOCK DRAWING 2/4.  Marland Kitchen ANIMAL FLUENCY SCORE 10.  CANNOT COPY INTERSECTING POLYGONS.    Cranial Nerves: Pupils equal, briskly reactive to light. Extraocular movements full  without nystagmus. Visual fields full to confrontation. Hearing intact and symmetric to finger snap. Facial sensation intact. Face, tongue, palate move normally and symmetrically.  Motor: Normal bulk and tone. Normal strength in all tested extremity muscles.No drift.  Sensory: Intact to touch and temperature, pinprick and vibratory in all extremities.  Coordination: Rapid alternating movements normal in all extremities. Finger-to-nose and heel-to-shin performed accurately bilaterally.  Gait and Station: Arises from chair without difficulty. Stance is wide based.uses a cane to walk. Unable to heel, toe, and slight difficulty with tandem. Romberg negative.    Reflexes: 1+ and symmetric. Toes downgoing.   ASSESSMENT AND PLAN 82 year old female with a history of multiple TIA's most recent Oct 14, 2009. Vascular risk factors inclued hypertension and previous TIA's. Headaches and paresthesias have improved. 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  Plan:    I had a long discussion with the patient and her daughter-in-law  Darlene Basque regarding her TIAs as well as mild dementia both of which appear to be stable. Continue Aggrenox for stroke prevention and strict control of hypertension with blood pressure goal below 130/90. Continue Aricept for dementia ind the current dose of 10 mg daily. I again encouraged the patient to use a cane and to avoid falls Patient and family seem reluctant to try higher dose at the present time. I had a long discussion with the patient and her daughter-in-law regarding her TIAs as well as mild dementia both of which appear to be stable. Continue Aggrenox for stroke prevention and strict control of hypertension with blood pressure goal below 130/90. Continue Aricept for dementia ind the current dose of 10 mg daily. Patient and family seem reluctant to try higher dose at the present time. Return for follow-up in a year or call earlier if necessary. Greater than 50% time during  this 25 minute visit was spent on counseling and coordination of care about her dementia and stroke risk Return for follow-up in a year or call earlier if necessary.  No orders of the defined types were placed in this encounter.   Return in about 1 year (around 08/11/2016).  Delia Heady, MD  08/12/2015, 3:47 PM [facility] [address] [place], [place] [address] [phone]  Note: This document was prepared with digital dictation and possible smart phrase technology. Any transcriptional errors that result from this process are unintentional.  I reviewed the above note and documentation by the Nurse Practitioner and agree with the history, physical exam, assessment and plan as outlined above.

## 2015-08-12 NOTE — Patient Instructions (Signed)
I had a long discussion with the patient and her daughter-in-law regarding her TIAs as well as mild dementia both of which appear to be stable. Continue Aggrenox for stroke prevention and strict control of hypertension with blood pressure goal below 130/90. Continue Aricept for dementia ind the current dose of 10 mg daily. Patient and family seem reluctant to try higher dose at the present time. Return for follow-up in a year or call earlier if necessary.

## 2015-08-19 ENCOUNTER — Telehealth: Payer: Self-pay | Admitting: *Deleted

## 2015-08-19 NOTE — Telephone Encounter (Signed)
Received faxes [2] reporting patient's weekly pulse [for weeks of 07/08/15 & 07/15/15] requiring provider signature; forwarded ot provider/SLS 03/22

## 2015-09-22 DIAGNOSIS — B351 Tinea unguium: Secondary | ICD-10-CM | POA: Diagnosis not present

## 2015-12-16 DIAGNOSIS — B351 Tinea unguium: Secondary | ICD-10-CM | POA: Diagnosis not present

## 2015-12-24 ENCOUNTER — Ambulatory Visit: Payer: Medicare Other | Admitting: Family Medicine

## 2016-03-18 ENCOUNTER — Telehealth: Payer: Self-pay | Admitting: *Deleted

## 2016-03-18 NOTE — Telephone Encounter (Signed)
Received FL[facility].  [facility].   Confirmation received.//AB/CMA

## 2016-06-28 ENCOUNTER — Encounter: Payer: Self-pay | Admitting: Family Medicine

## 2016-06-28 ENCOUNTER — Ambulatory Visit (INDEPENDENT_AMBULATORY_CARE_PROVIDER_SITE_OTHER): Payer: Medicare Other | Admitting: Family Medicine

## 2016-06-28 VITALS — BP 110/67 | HR 73 | Temp 97.9°F | Resp 18 | Wt 121.5 lb

## 2016-06-28 DIAGNOSIS — J40 Bronchitis, not specified as acute or chronic: Secondary | ICD-10-CM | POA: Diagnosis not present

## 2016-06-28 DIAGNOSIS — R6889 Other general symptoms and signs: Secondary | ICD-10-CM

## 2016-06-28 LAB — POC INFLUENZA A&B (BINAX/QUICKVUE)
INFLUENZA A, POC: NEGATIVE
Influenza B, POC: NEGATIVE

## 2016-06-28 MED ORDER — DOXYCYCLINE HYCLATE 100 MG PO TABS: 100.0000 mg | ORAL_TABLET | Freq: Two times a day (BID) | ORAL | 0 refills | Status: DC

## 2016-06-28 NOTE — Progress Notes (Signed)
Darlene SchmidtJanie B Wilson , 10/24/1926, 83 y.o., female MRN: [id number] Patient Care Team    Relationship Specialty Notifications Start End  Bradd CanaryStacey A Blyth, MD PCP - General Family Medicine  01/12/11    Comment: Merged    CC: cough  Subjective: Pt presents for an acute OV with complaints of cough of 2 days duration.  Associated symptoms include cough, sinus congestion, hoarseness, decreased appetite. She denies fever, chills, abd pain, headache, sore throat or nausea. She lives in an ALF and there had been one resident that tested flu positive 4 days ago. Nursing home states pt has been more tired and not eating as much as usual. Pt does have dementia. She is present with her daughter today.   Allergies  Allergen Reactions  . Ciprofloxacin Hives    Given with 2 other meds/unsure if truly allergic  . Combigan [Brimonidine Tartrate-Timolol] Other (See Comments)    Inflamation  . Detrol [Tolterodine Tartrate] Hives    Given with 2 other medications/unsure if truly allergic  . Oxycodone Hives    Given with 2 other medications/unsure if truly allergic  . Penicillins Hives, Itching and Rash  . Sulfa Antibiotics Hives, Itching and Rash   Social History  Substance Use Topics  . Smoking status: Never Smoker  . Smokeless tobacco: Never Used  . Alcohol use 0.6 oz/week    1 Glasses of wine per week     Comment: daily   Past Medical History:  Diagnosis Date  . Chronic kidney disease 1998   stones  . Depression with anxiety 09/14/2014  . Fibrocystic breast 01/12/2011  . Glaucoma    both eyes  . History of chicken pox   . Hypertension   . Insomnia 01/13/2011  . Kidney stone    stones   . Memory loss 10/12/2012  . Nausea 05/31/2012  . Spinal stenosis 06/14/2011  . Stroke (HCC) 04/2007   x 3 total  . Thyroid disease    hypothyroid  . Urinary frequency 02/09/2011  . Vertigo 02/09/2011   Past Surgical History:  Procedure Laterality Date  . ABDOMINAL HYSTERECTOMY  1963  . BACK SURGERY  1989   discectomy L4-5 1989, good results  . BREAST SURGERY  1970   biopsy, benign, b/l   Family History  Problem Relation Age of Onset  . Heart disease Mother   . Hypertension Mother   . Diabetes Mother   . Heart disease Father   . Hypertension Father   . Cancer Sister     breast cancer  . Mental illness Brother     service connected   Allergies as of 06/28/2016      Reactions   Ciprofloxacin Hives   Given with 2 other meds/unsure if truly allergic   Combigan [brimonidine Tartrate-timolol] Other (See Comments)   Inflamation   Detrol [tolterodine Tartrate] Hives   Given with 2 other medications/unsure if truly allergic   Oxycodone Hives   Given with 2 other medications/unsure if truly allergic   Penicillins Hives, Itching, Rash   Sulfa Antibiotics Hives, Itching, Rash      Medication List       Accurate as of 06/28/16  4:57 PM. Always use your most recent med list.          acetaminophen 325 MG tablet Commonly known as:  TYLENOL Take 2 tablets (650 mg total) by mouth every 6 (six) hours as needed.   amLODipine 5 MG tablet Commonly known as:  NORVASC Take 1 tablet (5 mg  total) by mouth daily.   citalopram 10 MG tablet Commonly known as:  CELEXA Take 1 tablet (10 mg total) by mouth daily.   dipyridamole-aspirin 200-25 MG 12hr capsule Commonly known as:  AGGRENOX TAKE 1 CAPSULE BY MOUTH 2 (TWO) TIMES DAILY.   donepezil 10 MG tablet Commonly known as:  ARICEPT Take 1 tablet (10 mg total) by mouth at bedtime.   doxycycline 100 MG tablet Commonly known as:  VIBRA-TABS Take 1 tablet (100 mg total) by mouth 2 (two) times daily.   gabapentin 100 MG capsule Commonly known as:  NEURONTIN TAKE 1 CAPSULE (100 MG TOTAL) BY MOUTH AT BEDTIME.   latanoprost 0.005 % ophthalmic solution Commonly known as:  XALATAN Place 1 drop into both eyes at bedtime.   levothyroxine 75 MCG tablet Commonly known as:  SYNTHROID, LEVOTHROID Take 1 tablet (75 mcg total) by mouth daily.     lisinopril 10 MG tablet Commonly known as:  PRINIVIL,ZESTRIL 1 tab po bid   timolol 0.5 % ophthalmic solution Commonly known as:  BETIMOL Place 1 drop into both eyes every morning.       No results found for this or any previous visit (from the past 24 hour(s)). No results found.   ROS: Negative, with the exception of above mentioned in HPI   Objective:  BP 110/67 (BP Location: Left Arm, Patient Position: Sitting, Cuff Size: Normal)   Pulse 73   Temp 97.9 F (36.6 C)   Resp 18   Wt 121 lb 8 oz (55.1 kg)   SpO2 97%   BMI 20.22 kg/m  Body mass index is 20.22 kg/m. Gen: Afebrile. No acute distress. Nontoxic in appearance, well developed, well nourished.  HENT: AT. [place]. Bilateral TM visualized WNL. Tacky mucous membranes, no oral lesions. Bilateral nares With drainage and erythema. Throat without erythema or exudates. No cough. Hoarseness present.  Eyes:Pupils Equal Round Reactive to light, Extraocular movements intact,  Conjunctiva without redness, discharge or icterus. Neck/lymp/endocrine: Supple, no lymphadenopathy CV: RRR  Chest: CTAB, no wheeze or crackles. Good air movement, normal resp effort.  Abd: Soft. NTND. BS present.  Skin: no rashes, purpura or petechiae.  Neuro: Normal gait. PERLA. EOMi. Alert.     No results found for this or any previous visit (from the past 24 hour(s)).   Assessment/Plan: Darlene Wilson is a 83 y.o. female present for acute OV for  Bronchitis - Influenza negative today.  - doxycyline 10 days BID - rest, hydrate.  -/+ plain mucinex.  - monitor for worsening symptoms or fever, chills.  - ALF form completed and faxed.  - F/U with PCP in 1 week if symptoms not improved or sooner if  pt worsening.   electronically signed by:  Felix Pacini, DO  [facility] [place]

## 2016-06-28 NOTE — Patient Instructions (Signed)
I have prescribed doxycyline every 12 hours for 10 days  Rest, hydrate.   If worsening symptoms, fever, chills or shortness of breath you should be seen immediately.

## 2016-07-01 ENCOUNTER — Encounter: Payer: Self-pay | Admitting: Family Medicine

## 2016-07-01 ENCOUNTER — Telehealth: Payer: Self-pay | Admitting: *Deleted

## 2016-07-01 DIAGNOSIS — R05 Cough: Secondary | ICD-10-CM | POA: Diagnosis not present

## 2016-07-01 NOTE — Telephone Encounter (Signed)
Urgent request for Chest Xray d/t cough & congestion; signed by PCP and faxed back to sender/SLS 02/02

## 2016-07-08 ENCOUNTER — Telehealth: Payer: Self-pay | Admitting: Family Medicine

## 2016-07-08 NOTE — Telephone Encounter (Signed)
Caller name: Teaching laboratory technicianCanan Baker  [facility] pt[facility]  Call back number: [phone]  Reason for call:  Checking on the status of "therapy request order form" as [facility] fax[facility] will fax to [phone] Attention [facility]

## 2016-07-11 NOTE — Telephone Encounter (Signed)
Order received.  Signed and faxed back.

## 2016-07-15 DIAGNOSIS — R488 Other symbolic dysfunctions: Secondary | ICD-10-CM | POA: Diagnosis not present

## 2016-07-15 DIAGNOSIS — M6281 Muscle weakness (generalized): Secondary | ICD-10-CM | POA: Diagnosis not present

## 2016-07-15 DIAGNOSIS — R2681 Unsteadiness on feet: Secondary | ICD-10-CM | POA: Diagnosis not present

## 2016-07-18 DIAGNOSIS — R2681 Unsteadiness on feet: Secondary | ICD-10-CM | POA: Diagnosis not present

## 2016-07-18 DIAGNOSIS — R488 Other symbolic dysfunctions: Secondary | ICD-10-CM | POA: Diagnosis not present

## 2016-07-18 DIAGNOSIS — M6281 Muscle weakness (generalized): Secondary | ICD-10-CM | POA: Diagnosis not present

## 2016-07-19 DIAGNOSIS — R2681 Unsteadiness on feet: Secondary | ICD-10-CM | POA: Diagnosis not present

## 2016-07-19 DIAGNOSIS — R488 Other symbolic dysfunctions: Secondary | ICD-10-CM | POA: Diagnosis not present

## 2016-07-19 DIAGNOSIS — M6281 Muscle weakness (generalized): Secondary | ICD-10-CM | POA: Diagnosis not present

## 2016-07-20 DIAGNOSIS — R2681 Unsteadiness on feet: Secondary | ICD-10-CM | POA: Diagnosis not present

## 2016-07-20 DIAGNOSIS — R488 Other symbolic dysfunctions: Secondary | ICD-10-CM | POA: Diagnosis not present

## 2016-07-20 DIAGNOSIS — M6281 Muscle weakness (generalized): Secondary | ICD-10-CM | POA: Diagnosis not present

## 2016-07-20 DIAGNOSIS — R6889 Other general symptoms and signs: Secondary | ICD-10-CM | POA: Diagnosis not present

## 2016-07-22 DIAGNOSIS — R2681 Unsteadiness on feet: Secondary | ICD-10-CM | POA: Diagnosis not present

## 2016-07-22 DIAGNOSIS — R488 Other symbolic dysfunctions: Secondary | ICD-10-CM | POA: Diagnosis not present

## 2016-07-22 DIAGNOSIS — M6281 Muscle weakness (generalized): Secondary | ICD-10-CM | POA: Diagnosis not present

## 2016-07-25 DIAGNOSIS — M6281 Muscle weakness (generalized): Secondary | ICD-10-CM | POA: Diagnosis not present

## 2016-07-25 DIAGNOSIS — R2681 Unsteadiness on feet: Secondary | ICD-10-CM | POA: Diagnosis not present

## 2016-07-25 DIAGNOSIS — R488 Other symbolic dysfunctions: Secondary | ICD-10-CM | POA: Diagnosis not present

## 2016-07-27 DIAGNOSIS — R488 Other symbolic dysfunctions: Secondary | ICD-10-CM | POA: Diagnosis not present

## 2016-07-27 DIAGNOSIS — R2681 Unsteadiness on feet: Secondary | ICD-10-CM | POA: Diagnosis not present

## 2016-07-27 DIAGNOSIS — N390 Urinary tract infection, site not specified: Secondary | ICD-10-CM | POA: Diagnosis not present

## 2016-07-27 DIAGNOSIS — M6281 Muscle weakness (generalized): Secondary | ICD-10-CM | POA: Diagnosis not present

## 2016-07-28 DIAGNOSIS — M6281 Muscle weakness (generalized): Secondary | ICD-10-CM | POA: Diagnosis not present

## 2016-07-28 DIAGNOSIS — R488 Other symbolic dysfunctions: Secondary | ICD-10-CM | POA: Diagnosis not present

## 2016-07-28 DIAGNOSIS — R2681 Unsteadiness on feet: Secondary | ICD-10-CM | POA: Diagnosis not present

## 2016-07-29 ENCOUNTER — Telehealth: Payer: Self-pay | Admitting: Family Medicine

## 2016-07-29 ENCOUNTER — Telehealth: Payer: Self-pay

## 2016-07-29 DIAGNOSIS — R488 Other symbolic dysfunctions: Secondary | ICD-10-CM | POA: Diagnosis not present

## 2016-07-29 DIAGNOSIS — M6281 Muscle weakness (generalized): Secondary | ICD-10-CM | POA: Diagnosis not present

## 2016-07-29 DIAGNOSIS — R2681 Unsteadiness on feet: Secondary | ICD-10-CM | POA: Diagnosis not present

## 2016-07-29 NOTE — Telephone Encounter (Signed)
Darlene Wilson is calling back([phone]) stating that UA culture results were on original fax

## 2016-07-29 NOTE — Telephone Encounter (Signed)
[facility]  Faxed over form with urine lab and culture. Culture was negative. No treatment.  Fax came in from [facility], RN stating patient has a persistent cough. With a blood pressure of 132/74   Per Dr. Abner GreenspanBlyth she wants to hold lisinopril for  x1 week and have  [facility] call back in one week to give repeat bp.  RN had been advised and faxed orders over.  PC

## 2016-08-03 DIAGNOSIS — R2681 Unsteadiness on feet: Secondary | ICD-10-CM | POA: Diagnosis not present

## 2016-08-03 DIAGNOSIS — M6281 Muscle weakness (generalized): Secondary | ICD-10-CM | POA: Diagnosis not present

## 2016-08-03 DIAGNOSIS — R488 Other symbolic dysfunctions: Secondary | ICD-10-CM | POA: Diagnosis not present

## 2016-08-04 DIAGNOSIS — R2681 Unsteadiness on feet: Secondary | ICD-10-CM | POA: Diagnosis not present

## 2016-08-04 DIAGNOSIS — R488 Other symbolic dysfunctions: Secondary | ICD-10-CM | POA: Diagnosis not present

## 2016-08-04 DIAGNOSIS — M6281 Muscle weakness (generalized): Secondary | ICD-10-CM | POA: Diagnosis not present

## 2016-08-05 DIAGNOSIS — R2681 Unsteadiness on feet: Secondary | ICD-10-CM | POA: Diagnosis not present

## 2016-08-05 DIAGNOSIS — M6281 Muscle weakness (generalized): Secondary | ICD-10-CM | POA: Diagnosis not present

## 2016-08-05 DIAGNOSIS — R488 Other symbolic dysfunctions: Secondary | ICD-10-CM | POA: Diagnosis not present

## 2016-08-08 DIAGNOSIS — R488 Other symbolic dysfunctions: Secondary | ICD-10-CM | POA: Diagnosis not present

## 2016-08-08 DIAGNOSIS — R2681 Unsteadiness on feet: Secondary | ICD-10-CM | POA: Diagnosis not present

## 2016-08-08 DIAGNOSIS — M6281 Muscle weakness (generalized): Secondary | ICD-10-CM | POA: Diagnosis not present

## 2016-08-10 ENCOUNTER — Encounter: Payer: Self-pay | Admitting: Neurology

## 2016-08-10 ENCOUNTER — Ambulatory Visit (INDEPENDENT_AMBULATORY_CARE_PROVIDER_SITE_OTHER): Payer: Medicare Other | Admitting: Neurology

## 2016-08-10 DIAGNOSIS — F0280 Dementia in other diseases classified elsewhere without behavioral disturbance: Secondary | ICD-10-CM | POA: Insufficient documentation

## 2016-08-10 DIAGNOSIS — F0281 Dementia in other diseases classified elsewhere with behavioral disturbance: Secondary | ICD-10-CM

## 2016-08-10 DIAGNOSIS — G301 Alzheimer's disease with late onset: Secondary | ICD-10-CM | POA: Diagnosis not present

## 2016-08-10 DIAGNOSIS — G309 Alzheimer's disease, unspecified: Secondary | ICD-10-CM

## 2016-08-10 NOTE — Progress Notes (Signed)
Darlene Wilson Kitchen.    PATIENT: Darlene Wilson DOB: 1927/02/12  REASON FOR VISIT: follow up HISTORY FROM: patient  HISTORY OF PRESENT ILLNESS: Darlene Wilson, 83 year old white female returns today for followup. She was last seen in this office 10/12/11 by Dr. Pearlean BrownieSethi. She has a history of TIA and is on Aggrenox. She has a history of 3 TIAs in the past. She went to the emergency room on Oct 14, 2009 for some weakness, numbness in her lip and tingling in the feet, it was characterized as generalized weakness. Her lab values were normal. MRI of the brain was found to be negative for any acute event. Mild to moderate small vessel disease changes. Vascular risk factors include hypertension and history of TIA's in addition to a history of hypothyroidism  Reports an episode of dizziness where she felt like the room was spinning around that lasted approximately 2-3 hours, she was [facility] on 08/12/11 and was diagnosed with vertigo. She was prescribed Antivert but has not used. She did take Dramamine the first day with relief. CT scan of head showed no acute finding and atrophy and chronic microvascular ischemic change. Cholesterol levels were checked by her PCP and report they were normal, I do not have the results today. Denies any falls, ambulates with cane. Denies any new neurologic symptoms. Has right foot numbness since her stroke. Taking gabapentin Continues to exercise routinely. Blood pressure today is 120/60. Reports a fullness in her head, will take Tylenol with relief.  05/09/12: Returns for followup with daughter. No further stroke or TIA symptoms. Taking aggrenox without significant bruising. Parathesias not completely controlled with low dose Gabapentin. Continues to exercise several times weekly. No new complaints   UPDATE 01/30/13 (LL):  Patient returns for follow up visit, no new physical complaints. Paresthesias under control. Continues twice daily Aggrenox for stroke prevention, denies side  effects or significant bruising. States that she is having some short term memory problems. Daughter that accompanies her shakes her head in agreement when her mother isn't looking.  UPDATE 08/01/13 (LL):  Patient returns for follow up visit, no new physical complaints.  She was started on Donepizil by Dr. Rogelia RohrerBlythe, taking 10 mg daily and tolerating it well.  Continues twice daily Aggrenox for stroke prevention, denies side effects or significant bruising.  Continues to exercise several times weekly, BP is well controlled, it is 126/74 in office today.  UPDATE 02/11/14 : (PS) : She returns for followup after last visit 6 months ago. She is accompanied by her daughter. Patient remains independent in activities of daily living except the daughter and was her finances and ranges from medications. She does occasionally get confused about taking her medicines. There have been no safety concerns, falls, agitation, but hallucinations delusions or behavioral concerns. She has not had a recurrent stroke or TIAs. She is tolerating Aggrenox well without side effects. Her blood pressure is usually well controlled though today it is slightly elevated at 136/71. She had last lipid profile checked 6 months ago by primary physician and was excellent. Patient was started on Aricept following the last visit and she is tolerating it well without side effects. Her Mini-Mental status exam testing score today is  22/30 today which is improved from 19/30 from last visit. Update 02/13/2015 : She returns for follow-up after last visit a year ago. She is accompanied by her daughter Darlene BasqueBecky. Patient has since moved into assisted living as the family is concerned as she left the burner on  for the store couple of occasions. She continues to have poor short-term memory. Medications and I'll supervise in assisted living. She's not had any recurrent stroke or TIA symptoms. She did have significant blood pressure elevation a few months ago and her  blood pressure medications were increased and today it's doing much better at 127/70. She's had no recent falls. She does not have any hallucinations delusions or behavioral agitation issues. She had follow-up carotid ultrasound done on 02/19/14 which showed minor plaques bilaterally without hemodynamically significant stenosis. She remains on Aggrenox which is tolerating well without side effects  Update 08/12/2015: She returns for follow-up after last visit 6 months ago. She is accompanied by her daughter-in-law Darlene Wilson today. Patient continues to live in [facility] assisted living facility and is well settled there. She is independent with her personal hygiene and needs only help with washing her clothes and preparing her meals. She is quite active and does participate in several activities that. She has not had any unsafe behavior, falls, delusions, hallucinations. She remains on Aggrenox which is tolerating well as well as Aricept 10 mg daily. The patient did not do well on cognitive testing today and scored only 15/30 compared to 21/30 at last visit but patient and daughter-in-law feel that she is just had a bad day today. They are reluctant in considering increasing the dose of Aricept or adding Namenda at the current time Update 08/10/2016 : She returns for follow-up after last visit 1 year ago. She is accompanied by her daughter. Patient is living at [facility] assisted living facility. She is provided her medications under supervision. She is overall doing quite well there. She participated well in all activities there. She has been needing increasing help with bathing and dressing. She does get frustrated and agitated and upset as she states she does not like to be told to do things. She is not had any delusions, hallucinations or unsafe behavior. She is tolerating Aricept 10 mg a day well without side effects. Family has refused to consider increasing the dose and prior visits. She remains on  Aggrenox for stroke prevention and has not had any recurrent stroke or TIA symptoms. Her blood pressure is well controlled and today it is 115/60. She has no other new complaints REVIEW OF SYSTEMS: Full 14 system review of systems performed and notable only for:  Memory loss, gait difficulties, confusion, cough and all other systems negative  ALLERGIES: Allergies  Allergen Reactions  . Ciprofloxacin Hives    Given with 2 other meds/unsure if truly allergic  . Combigan [Brimonidine Tartrate-Timolol] Other (See Comments)    Inflamation  . Detrol [Tolterodine Tartrate] Hives    Given with 2 other medications/unsure if truly allergic  . Oxycodone Hives    Given with 2 other medications/unsure if truly allergic  . Penicillins Hives, Itching and Rash  . Sulfa Antibiotics Hives, Itching and Rash    HOME MEDICATIONS: Outpatient Medications Prior to Visit  Medication Sig Dispense Refill  . acetaminophen (TYLENOL) 325 MG tablet Take 2 tablets (650 mg total) by mouth every 6 (six) hours as needed.    Darlene Wilson Kitchen amLODipine (NORVASC) 5 MG tablet Take 1 tablet (5 mg total) by mouth daily. 30 tablet 6  . citalopram (CELEXA) 10 MG tablet Take 1 tablet (10 mg total) by mouth daily. 90 tablet 2  . dipyridamole-aspirin (AGGRENOX) 200-25 MG per 12 hr capsule TAKE 1 CAPSULE BY MOUTH 2 (TWO) TIMES DAILY. 180 capsule 2  . donepezil (ARICEPT) 10 MG  tablet Take 1 tablet (10 mg total) by mouth at bedtime. 90 tablet 1  . gabapentin (NEURONTIN) 100 MG capsule TAKE 1 CAPSULE (100 MG TOTAL) BY MOUTH AT BEDTIME. 90 capsule 1  . latanoprost (XALATAN) 0.005 % ophthalmic solution Place 1 drop into both eyes at bedtime. 2.5 mL 0  . levothyroxine (SYNTHROID, LEVOTHROID) 75 MCG tablet Take 1 tablet (75 mcg total) by mouth daily. 90 tablet 1  . lisinopril (PRINIVIL,ZESTRIL) 10 MG tablet 1 tab po bid (Patient taking differently: Take 10 mg by mouth daily. ) 60 tablet 0  . timolol (BETIMOL) 0.5 % ophthalmic solution Place 1 drop into  both eyes every morning.      Darlene Wilson Kitchen doxycycline (VIBRA-TABS) 100 MG tablet Take 1 tablet (100 mg total) by mouth 2 (two) times daily. (Patient not taking: Reported on 08/10/2016) 20 tablet 0   No facility-administered medications prior to visit.      PHYSICAL EXAM  Vitals:   08/10/16 1511  BP: 115/60  Pulse: 82  Weight: 125 lb 12.8 oz (57.1 kg)   Body mass index is 20.93 kg/m.  Generalized:  pleasant frail elderly Caucasian female  Neck: Supple, no carotid bruits  Cardiac: Regular rate rhythm, no murmur Skin: Few petechiae   Neurologic Exam  Mental Status: Awake and fully alert. Follows 1,2 step commands. Mood and affect appropriate. No aphasia, apraxia or dysarthia.  MMSE not done(15/30 last visit) WITH DEFICITS IN ORIENTATION  ATTENTION AND CALCULATION, DELAYED RECALL 0/3. CLOCK DRAWING 2/4.  Darlene Wilson Kitchen ANIMAL FLUENCY SCORE 5 only.  CANNOT COPY INTERSECTING POLYGONS.    Cranial Nerves: Pupils equal, briskly reactive to light. Extraocular movements full without nystagmus. Visual fields full to confrontation. Hearing intact and symmetric to finger snap. Facial sensation intact. Face, tongue, palate move normally and symmetrically.  Motor: Normal bulk and tone. Normal strength in all tested extremity muscles.No drift.  Sensory: Intact to touch and temperature, pinprick and vibratory in all extremities.  Coordination: Rapid alternating movements normal in all extremities. Finger-to-nose and heel-to-shin performed accurately bilaterally.  Gait and Station: Arises from chair without difficulty. Stance is wide based.uses a cane to walk. Unable to heel, toe, and slight difficulty with tandem. Romberg negative.    Reflexes: 1+ and symmetric. Toes downgoing.   ASSESSMENT AND PLAN 83 year old female with a history of multiple TIA's most recent Oct 14, 2009. Vascular risk factors inclued hypertension and previous TIA's. Headaches and paresthesias have improved. Mild-Moderate Dementia, Vascular vs.  Alzheimer's.  Plan:   I had a long discussion with the patient and her daughter regarding her remote histories history of TIAs and moderate Alzheimer's dementia which appears to be stable. Continue Aggrenox for stroke prevention and strict control of hypertension with blood pressure goal below 130/90. Continue Aricept and the current dose of 10 mg daily. The family has been reluctant to consider a higher dose in the past The patient may also consider possible participation in the dementia agitation study and will be given information to review and decide later. She will return for follow-up in a year or call earlier if necessary Greater than 50% time during this 25 minute visit was spent on counseling and coordination of care about her dementia and stroke risk Return for follow-up in a year or call earlier if necessary.                      Return in about 1 year (around 08/10/2017).  Delia Heady, MD  08/10/2016, 3:38 PM  [facility] [address], [address] [place], [place] [address] [phone]  Note: This document was prepared with digital dictation and possible smart phrase technology. Any transcriptional errors that result from this process are unintentional.  I reviewed the above note and documentation by the Nurse Practitioner and agree with the history, physical exam, assessment and plan as outlined above.

## 2016-08-10 NOTE — Patient Instructions (Signed)
I had a long discussion with the patient and her daughter regarding her remote histories history of TIAs and moderate Alzheimer's dementia which appears to be stable. Continue Aggrenox for stroke prevention and strict control of hypertension with blood pressure goal below 130/90. Continue Aricept and the current dose of 10 mg daily. The patient may also consider possible participation in the dementia agitation study and will be given information to review and decide later. She will return for follow-up in a year or call earlier if necessary

## 2016-08-12 ENCOUNTER — Telehealth: Payer: Self-pay

## 2016-08-12 NOTE — Telephone Encounter (Signed)
Attempted to call Arther DamesPriscilla Harris of [facility], [phone] on 08/12/2016 at approximately 11:18am. LVM advising the discontinuation of Lisinopril per Dr. Abner GreenspanBlyth. Left Office number should any questions arise.

## 2016-08-17 ENCOUNTER — Telehealth: Payer: Self-pay | Admitting: Family Medicine

## 2016-08-17 DIAGNOSIS — R05 Cough: Secondary | ICD-10-CM | POA: Diagnosis not present

## 2016-08-17 MED ORDER — DOXYCYCLINE HYCLATE 100 MG PO CAPS: 100.0000 mg | ORAL_CAPSULE | Freq: Two times a day (BID) | ORAL | 0 refills | Status: DC

## 2016-08-17 NOTE — Telephone Encounter (Signed)
Need to know more, vital signs, symptoms, can they get labs, CXR? Do they have someone there that can evaluate and treat? Once I know more I can consider treating if they need me to

## 2016-08-17 NOTE — Telephone Encounter (Signed)
Patient's daughter, Elease Hashimotoatricia, called back. She would like CXR and orders to be addressed today by another provider in PCP absence. She desires that her mother start treatment ASAP if indicated.   Dr. Patsy Lageropland, please advise in PCP absence.

## 2016-08-17 NOTE — Telephone Encounter (Signed)
Caller name:Patricia (daughter in law)  and Gershon Cullriscilla from [facility]  Call back number: [phone] / [phone]   Reason for call:  Daughter calling regarding patient stating she's [facility] residing [facility], requesting Rx for breathing treatment and rx for possible pneumonia, please advise

## 2016-08-17 NOTE — Telephone Encounter (Signed)
Returned call to [place] at [phone]. They do not have an in house provider to eval/treat. Patient is experiencing mild cough and malaise. She is afebrile.   Last VS are as follows. BP 130/74 P 76 RR 18 T 97.4 F  CXR was already completed and results sent via fax.  Impression:  1. Mild patchy density in LLL compatible with PNA. In comparison to prior study, findings are new. Follow-up CXR suggested as clinically warranted.  2. Cardiac size mildly prominent. 3. Mild osteoporosis demonstrated.  4. Moderate degenerative arthritis.   Gershon Cullriscilla also faxed orders for Duoneb, 1 vial HHN TID x 7 days for PNA and Mucinex 600 mg PO BID x7 days.  Please advise.

## 2016-08-17 NOTE — Telephone Encounter (Signed)
Pt had a mobile CXR performed today in house [facility].   It does show a probable LLL pneumonia.  No effusion  Called her daughter Elease Hashimotoatricia at [phone]; got more history and let her know that we will treat her mom for pneumonia  Per Edythe LynnPatricia Rebekah has had a cough for several weeks, no known fever and is looking pretty well overall. Will send in an rx for doxycycline for her  Called nursing staff and alerted them, will fax in rx now  They also sent an order for duonebs and mucinex- signed this and will fax back to facility now.    Will forward message to Dr. Leonard SchwartzB- please see CXR report- consider repeat film after treatment per radiology report

## 2016-08-17 NOTE — Telephone Encounter (Signed)
call in Cefdinir 300 mg po bid x 10 days and repeat Cxr in 3-4 weeks.

## 2016-08-17 NOTE — Telephone Encounter (Signed)
She was already treated with doxycycline so disregard the Cefdinir rx

## 2016-10-18 ENCOUNTER — Telehealth: Payer: Self-pay

## 2016-10-18 NOTE — Telephone Encounter (Signed)
DNR [facility] received from [facility].   Need to verify from the PATIENT ONLY that she has requested the DNR forms. Contacted daughter in law [place]y ([place]), to speak with the patient left voice mail for her to call the office back.   Per Dr. Abner GreenspanBlyth we need to hear from the patient that she has requested the DNR from then she will sign them and return them to [facility].      PC

## 2016-10-18 NOTE — Telephone Encounter (Signed)
Spoke with Geraldine Contrasee, LPN at [facility] she stated there is already an order for a DNR and also a MOST form in the patients chart however the Wellness Department and the Activities Department need their own copies. Also Darlene CelesteJanie is not cognitively available at this time to understand what is going on.   I have given the DNR to the patients PCP she has agreed to sign. I have mailed a copy of the DNR forms to [facility] and scanned a new copy to the chart. PCP has been made aware. Original DNR signed 06/23/2015. There has been no changes.     PC    No need to speak with the daughter in law [place]y

## 2016-10-19 NOTE — Telephone Encounter (Signed)
Kennith CenterBecky Okey (daughter in-law) called back. Please call her at [phone]

## 2016-10-31 DIAGNOSIS — B351 Tinea unguium: Secondary | ICD-10-CM | POA: Diagnosis not present

## 2016-11-17 ENCOUNTER — Ambulatory Visit (INDEPENDENT_AMBULATORY_CARE_PROVIDER_SITE_OTHER): Payer: Medicare Other | Admitting: *Deleted

## 2016-11-17 ENCOUNTER — Encounter: Payer: Self-pay | Admitting: *Deleted

## 2016-11-17 VITALS — BP 144/78 | HR 77 | Ht 63.0 in | Wt 135.2 lb

## 2016-11-17 DIAGNOSIS — Z0000 Encounter for general adult medical examination without abnormal findings: Secondary | ICD-10-CM

## 2016-11-17 NOTE — Progress Notes (Signed)
Subjective:   Darlene Wilson is a 83 y.o. female who presents for Medicare Annual (Subsequent) preventive examination. She is here today with her daughter-in-law, Kriste Basque.  Review of Systems:  No ROS.  Medicare Wellness Visit. Additional risk factors are reflected in the social history.  Cardiac Risk Factors include: advanced age (>66men, >49 women);hypertension  Sleep patterns: no sleep issues.   Home Safety/Smoke Alarms: Feels safe in home. Smoke alarms in place. Wears Life Alert pendant 'all the time.' Living environment; residence and Firearm Safety: Lives at [facility] in a private room. assisted living, can live on one level, walk-in shower, equipment: Cane, Type: Single [employer], no firearms.  Seat Belt Safety/Bike Helmet: Wears seat belt.   Counseling:   Eye Exam- Follows w/ Dr. Hazle Quant  Dental- Follows w/ dentist PRN only. [facility].  Female:   Pap- Aged out       Mammo- Aged out  Dexa scan-   Not on file.     CCS- Aged out    Objective:     Vitals: BP (!) 144/78   Pulse 77   Ht 5\' 3"  (1.6 m)   Wt 135 lb 3.2 oz (61.3 kg)   SpO2 98%   BMI 23.95 kg/m   Body mass index is 23.95 kg/m.   Tobacco History  Smoking Status  . Never Smoker  Smokeless Tobacco  . Never Used     Counseling given: Not Answered   Past Medical History:  Diagnosis Date  . Chronic kidney disease 1998   stones  . Depression with anxiety 09/14/2014  . Fibrocystic breast 01/12/2011  . Glaucoma    both eyes  . History of chicken pox   . Hypertension   . Insomnia 01/13/2011  . Kidney stone    stones   . Memory loss 10/12/2012  . Nausea 05/31/2012  . Spinal stenosis 06/14/2011  . Stroke (HCC) 04/2007   x 3 total  . Thyroid disease    hypothyroid  . Urinary frequency 02/09/2011  . Vertigo 02/09/2011   Past Surgical History:  Procedure Laterality Date  . ABDOMINAL HYSTERECTOMY  1963  . BACK SURGERY  1989   discectomy L4-5 1989, good results  . BREAST  SURGERY  1970   biopsy, benign, b/l   Family History  Problem Relation Age of Onset  . Heart disease Mother   . Hypertension Mother   . Diabetes Mother   . Heart disease Father   . Hypertension Father   . Cancer Sister        breast cancer  . Mental illness Brother        service connected   History  Sexual Activity  . Sexual activity: Not Currently    Comment: lives alone, no dietary restrictions    Outpatient Encounter Prescriptions as of 11/17/2016  Medication Sig  . acetaminophen (TYLENOL) 325 MG tablet Take 2 tablets (650 mg total) by mouth every 6 (six) hours as needed.  Marland Kitchen amLODipine (NORVASC) 5 MG tablet Take 1 tablet (5 mg total) by mouth daily.  . citalopram (CELEXA) 10 MG tablet Take 1 tablet (10 mg total) by mouth daily.  Marland Kitchen dipyridamole-aspirin (AGGRENOX) 200-25 MG per 12 hr capsule TAKE 1 CAPSULE BY MOUTH 2 (TWO) TIMES DAILY.  Marland Kitchen donepezil (ARICEPT) 10 MG tablet Take 1 tablet (10 mg total) by mouth at bedtime.  Marland Kitchen doxycycline (VIBRAMYCIN) 100 MG capsule Take 1 capsule (100 mg total) by mouth 2 (two) times daily.  Marland Kitchen gabapentin (NEURONTIN)  100 MG capsule TAKE 1 CAPSULE (100 MG TOTAL) BY MOUTH AT BEDTIME.  Marland Kitchen. latanoprost (XALATAN) 0.005 % ophthalmic solution Place 1 drop into both eyes at bedtime.  Marland Kitchen. levothyroxine (SYNTHROID, LEVOTHROID) 75 MCG tablet Take 1 tablet (75 mcg total) by mouth daily.  Marland Kitchen. lisinopril (PRINIVIL,ZESTRIL) 10 MG tablet 1 tab po bid (Patient taking differently: Take 10 mg by mouth daily. )  . loperamide (IMODIUM A-D) 2 MG tablet Take 2 mg by mouth 4 (four) times daily as needed for diarrhea or loose stools.  . ondansetron (ZOFRAN) 4 MG tablet Take 4 mg by mouth every 8 (eight) hours as needed for nausea or vomiting.  . timolol (BETIMOL) 0.5 % ophthalmic solution Place 1 drop into both eyes every morning.     No facility-administered encounter medications on file as of 11/17/2016.     Activities of Daily Living In your present state of health, do you  have any difficulty performing the following activities: 11/17/2016  Hearing? N  Vision? N  Difficulty concentrating or making decisions? Y  Walking or climbing stairs? Y  Dressing or bathing? N  Doing errands, shopping? Y  Preparing Food and eating ? Y  Using the Toilet? Y  In the past six months, have you accidently leaked urine? N  Do you have problems with loss of bowel control? N  Managing your Medications? Y  Managing your Finances? Y  Housekeeping or managing your Housekeeping? Y  Some recent data might be hidden    Patient Care Team: Bradd CanaryBlyth, Stacey A, MD as PCP - General (Family Medicine) Nelson Chimesigby, Donald, MD as Consulting Physician (Ophthalmology) Micki RileySethi, Pramod S, MD as Consulting Physician (Neurology)    Assessment:    Physical assessment deferred to PCP.  Exercise Activities and Dietary recommendations Current Exercise Habits: Home exercise routine, Type of exercise: walking, Frequency (Times/Week): >7, Intensity: Mild  Diet (meal preparation, eat out, water intake, caffeinated beverages, dairy products, fruits and vegetables): in general, a "healthy" diet  , well balanced, on average, 3 meals per day. Drinks mostly water, avoids soda. All meals are prepared and served by staff at [facility].      Goals    . Maintain current health status (pt-stated)          Continue walking daily and staying active.  Continue doing brain stimulating activities-jewelry making, Bingo, etc.      Fall Risk Fall Risk  11/17/2016 08/10/2016 08/12/2015 06/23/2015 02/12/2015  Falls in the past year? No No No No No  Risk for fall due to : Impaired balance/gait - - - Impaired balance/gait;Mental status change   Depression Screen PHQ 2/9 Scores 11/17/2016 06/23/2015 04/07/2014  PHQ - 2 Score 0 0 0     Cognitive Function MMSE - Mini Mental State Exam 11/17/2016  Orientation to time 0  Orientation to Place 2  Registration 3  Attention/ Calculation 1  Recall 1  Language- name 2 objects 2    Language- repeat 1  Language- follow 3 step command 2  Language- read & follow direction 1  Write a sentence 1  Copy design 0  Total score 14        Immunization History  Administered Date(s) Administered  . Influenza Split 02/09/2011, 02/28/2012  . Influenza,inj,Quad PF,36+ Mos 02/06/2013, 04/07/2014  . PPD Test 11/14/2014  . Pneumococcal Conjugate-13 04/07/2014  . Pneumococcal Polysaccharide-23 07/28/2008  . Tdap 04/30/2007   Screening Tests Health Maintenance  Topic Date Due  . DEXA SCAN  01/15/1994  . INFLUENZA VACCINE  12/28/2016  . TETANUS/TDAP  04/29/2017  . PNA vac Low Risk Adult  Completed      Plan:    Bring a copy of your advance directives to your next office visit.  Consider the new shingles vaccine, Shingrix. This is available at most pharmacies.  Schedule a routine follow-up appointment with Dr. Abner Greenspan at your convenience. Do try to do this before the end of this year.   Pt w/ known dementia. MMSE stable from previous. Follows w/ Dr. Pearlean Brownie annually.   I have personally reviewed and noted the following in the patient's chart:   . Medical and social history . Use of alcohol, tobacco or illicit drugs  . Current medications and supplements . Functional ability and status . Nutritional status . Physical activity . Advanced directives . List of other physicians . Vitals . Screenings to include cognitive, depression, and falls . Referrals and appointments  In addition, I have reviewed and discussed with patient certain preventive protocols, quality metrics, and best practice recommendations. A written personalized care plan for preventive services as well as general preventive health recommendations were provided to patient.     Starla Link, RN  11/17/2016

## 2016-11-17 NOTE — Patient Instructions (Addendum)
Darlene Wilson , Thank you for taking time to come for your Medicare Wellness Visit. I appreciate your ongoing commitment to your health goals. Please review the following plan we discussed and let me know if I can assist you in the future.   Bring a copy of your advance directives to your next office visit.  Consider the new shingles vaccine, Shingrix. This is available at most pharmacies.  Schedule a routine follow-up appointment with Dr. Charlett Blake at your convenience. Do try to do this before the end of this year.   These are the goals we discussed: Goals    . Maintain current health status (pt-stated)          Continue walking daily and staying active.  Continue doing brain stimulating activities-jewelry making, Bingo, etc.       This is a list of the screening recommended for you and due dates:  Health Maintenance  Topic Date Due  . DEXA scan (bone density measurement)  01/15/1994  . Flu Shot  12/28/2016  . Tetanus Vaccine  04/29/2017  . Pneumonia vaccines  Completed    Preventive Care 60 Years and Older, Female Preventive care refers to lifestyle choices and visits with your health care provider that can promote health and wellness. What does preventive care include?  A yearly physical exam. This is also called an annual well check.  Dental exams once or twice a year.  Routine eye exams. Ask your health care provider how often you should have your eyes checked.  Personal lifestyle choices, including: ? Daily care of your teeth and gums. ? Regular physical activity. ? Eating a healthy diet. ? Avoiding tobacco and drug use. ? Limiting alcohol use. ? Practicing safe sex. ? Taking low-dose aspirin every day. ? Taking vitamin and mineral supplements as recommended by your health care provider. What happens during an annual well check? The services and screenings done by your health care provider during your annual well check will depend on your age, overall health,  lifestyle risk factors, and family history of disease. Counseling Your health care provider may ask you questions about your:  Alcohol use.  Tobacco use.  Drug use.  Emotional well-being.  Home and relationship well-being.  Sexual activity.  Eating habits.  History of falls.  Memory and ability to understand (cognition).  Work and work Statistician.  Reproductive health.  Screening You may have the following tests or measurements:  Height, weight, and BMI.  Blood pressure.  Lipid and cholesterol levels. These may be checked every 5 years, or more frequently if you are over 88 years old.  Skin check.  Lung cancer screening. You may have this screening every year starting at age 65 if you have a 30-pack-year history of smoking and currently smoke or have quit within the past 15 years.  Fecal occult blood test (FOBT) of the stool. You may have this test every year starting at age 94.  Flexible sigmoidoscopy or colonoscopy. You may have a sigmoidoscopy every 5 years or a colonoscopy every 10 years starting at age 30.  Hepatitis C blood test.  Hepatitis B blood test.  Sexually transmitted disease (STD) testing.  Diabetes screening. This is done by checking your blood sugar (glucose) after you have not eaten for a while (fasting). You may have this done every 1-3 years.  Bone density scan. This is done to screen for osteoporosis. You may have this done starting at age 71.  Mammogram. This may be done every 1-2 years.  Talk to your health care provider about how often you should have regular mammograms.  Talk with your health care provider about your test results, treatment options, and if necessary, the need for more tests. Vaccines Your health care provider may recommend certain vaccines, such as:  Influenza vaccine. This is recommended every year.  Tetanus, diphtheria, and acellular pertussis (Tdap, Td) vaccine. You may need a Td booster every 10  years.  Varicella vaccine. You may need this if you have not been vaccinated.  Zoster vaccine. You may need this after age 12.  Measles, mumps, and rubella (MMR) vaccine. You may need at least one dose of MMR if you were born in 1957 or later. You may also need a second dose.  Pneumococcal 13-valent conjugate (PCV13) vaccine. One dose is recommended after age 7.  Pneumococcal polysaccharide (PPSV23) vaccine. One dose is recommended after age 68.  Meningococcal vaccine. You may need this if you have certain conditions.  Hepatitis A vaccine. You may need this if you have certain conditions or if you travel or work in places where you may be exposed to hepatitis A.  Hepatitis B vaccine. You may need this if you have certain conditions or if you travel or work in places where you may be exposed to hepatitis B.  Haemophilus influenzae type b (Hib) vaccine. You may need this if you have certain conditions.  Talk to your health care provider about which screenings and vaccines you need and how often you need them. This information is not intended to replace advice given to you by your health care provider. Make sure you discuss any questions you have with your health care provider. Document Released: 06/12/2015 Document Revised: 02/03/2016 Document Reviewed: 03/17/2015 Elsevier Interactive Patient Education  2017 [employer].

## 2016-11-28 ENCOUNTER — Telehealth: Payer: Self-pay | Admitting: *Deleted

## 2016-11-28 NOTE — Telephone Encounter (Signed)
[facility], forwarded to provider/SLS 07/02

## 2016-12-28 ENCOUNTER — Telehealth: Payer: Self-pay | Admitting: *Deleted

## 2016-12-28 NOTE — Telephone Encounter (Signed)
[facility], forwarded to provider/SLS 08/01

## 2017-02-03 ENCOUNTER — Telehealth: Payer: Self-pay | Admitting: *Deleted

## 2017-02-03 NOTE — Telephone Encounter (Signed)
[facility]; forwarded to provider/SLS 09/07

## 2017-03-27 ENCOUNTER — Telehealth: Payer: Self-pay | Admitting: *Deleted

## 2017-03-27 NOTE — Telephone Encounter (Signed)
[facility]: forwarded to provider/SLS 10/29

## 2017-04-03 ENCOUNTER — Telehealth: Payer: Self-pay | Admitting: *Deleted

## 2017-04-03 NOTE — Telephone Encounter (Signed)
Received [facility] with [facility]; forwarded to provider/SLS 11/05

## 2017-05-20 DIAGNOSIS — R062 Wheezing: Secondary | ICD-10-CM | POA: Diagnosis not present

## 2017-05-22 ENCOUNTER — Telehealth: Payer: Self-pay | Admitting: *Deleted

## 2017-05-22 NOTE — Telephone Encounter (Signed)
[facility] post nasal drip, cough, nasal congestion; forwarded to covering provider/SLS 12/24

## 2017-06-02 ENCOUNTER — Telehealth: Payer: Self-pay | Admitting: *Deleted

## 2017-06-02 NOTE — Telephone Encounter (Signed)
[facility]; forwarded to provider/SLS 01/04

## 2017-06-05 ENCOUNTER — Telehealth: Payer: Self-pay | Admitting: *Deleted

## 2017-06-05 NOTE — Telephone Encounter (Signed)
[facility] and [facility] for UTI d/t increased confusion;forwarded to provider/SLS 01/07

## 2017-06-06 ENCOUNTER — Encounter: Payer: Self-pay | Admitting: Family Medicine

## 2017-06-06 DIAGNOSIS — N390 Urinary tract infection, site not specified: Secondary | ICD-10-CM | POA: Diagnosis not present

## 2017-06-12 ENCOUNTER — Telehealth: Payer: Self-pay | Admitting: *Deleted

## 2017-06-12 NOTE — Telephone Encounter (Signed)
[facility] request for physician's to advise; forwarded to provider/SLS 01/14

## 2017-07-26 ENCOUNTER — Emergency Department (HOSPITAL_COMMUNITY)
Admission: EM | Admit: 2017-07-26 | Discharge: 2017-07-26 | Disposition: A | Discharge status: Home / Self Care | Payer: Medicare Other | Attending: Emergency Medicine | Admitting: Emergency Medicine

## 2017-07-26 ENCOUNTER — Emergency Department (HOSPITAL_COMMUNITY): Payer: Medicare Other

## 2017-07-26 ENCOUNTER — Encounter (HOSPITAL_COMMUNITY): Payer: Self-pay | Admitting: Emergency Medicine

## 2017-07-26 DIAGNOSIS — N189 Chronic kidney disease, unspecified: Secondary | ICD-10-CM | POA: Diagnosis not present

## 2017-07-26 DIAGNOSIS — R4182 Altered mental status, unspecified: Secondary | ICD-10-CM

## 2017-07-26 DIAGNOSIS — I129 Hypertensive chronic kidney disease with stage 1 through stage 4 chronic kidney disease, or unspecified chronic kidney disease: Secondary | ICD-10-CM | POA: Diagnosis not present

## 2017-07-26 DIAGNOSIS — E039 Hypothyroidism, unspecified: Secondary | ICD-10-CM | POA: Diagnosis not present

## 2017-07-26 DIAGNOSIS — R1084 Generalized abdominal pain: Secondary | ICD-10-CM | POA: Diagnosis not present

## 2017-07-26 DIAGNOSIS — R42 Dizziness and giddiness: Secondary | ICD-10-CM | POA: Diagnosis not present

## 2017-07-26 DIAGNOSIS — Z79899 Other long term (current) drug therapy: Secondary | ICD-10-CM | POA: Diagnosis not present

## 2017-07-26 DIAGNOSIS — R569 Unspecified convulsions: Secondary | ICD-10-CM | POA: Diagnosis not present

## 2017-07-26 DIAGNOSIS — R55 Syncope and collapse: Secondary | ICD-10-CM | POA: Diagnosis not present

## 2017-07-26 DIAGNOSIS — R109 Unspecified abdominal pain: Secondary | ICD-10-CM | POA: Diagnosis not present

## 2017-07-26 DIAGNOSIS — G309 Alzheimer's disease, unspecified: Secondary | ICD-10-CM | POA: Diagnosis not present

## 2017-07-26 DIAGNOSIS — R404 Transient alteration of awareness: Secondary | ICD-10-CM | POA: Diagnosis not present

## 2017-07-26 LAB — CBC WITH DIFFERENTIAL/PLATELET
BASOS ABS: 0 10*3/uL (ref 0.0–0.1)
BASOS PCT: 0 %
EOS ABS: 0 10*3/uL (ref 0.0–0.7)
Eosinophils Relative: 0 %
HCT: 39.9 % (ref 36.0–46.0)
HEMOGLOBIN: 13.2 g/dL (ref 12.0–15.0)
LYMPHS ABS: 0.9 10*3/uL (ref 0.7–4.0)
Lymphocytes Relative: 9 %
MCH: 32.7 pg (ref 26.0–34.0)
MCHC: 33.1 g/dL (ref 30.0–36.0)
MCV: 98.8 fL (ref 78.0–100.0)
Monocytes Absolute: 0.8 10*3/uL (ref 0.1–1.0)
Monocytes Relative: 9 %
NEUTROS PCT: 82 %
Neutro Abs: 7.4 10*3/uL (ref 1.7–7.7)
Platelets: 253 10*3/uL (ref 150–400)
RBC: 4.04 MIL/uL (ref 3.87–5.11)
RDW: 14.2 % (ref 11.5–15.5)
WBC: 9.1 10*3/uL (ref 4.0–10.5)

## 2017-07-26 LAB — COMPREHENSIVE METABOLIC PANEL
ALBUMIN: 3.6 g/dL (ref 3.5–5.0)
ALK PHOS: 95 U/L (ref 38–126)
ALT: 11 U/L — AB (ref 14–54)
AST: 18 U/L (ref 15–41)
Anion gap: 10 (ref 5–15)
BUN: 12 mg/dL (ref 6–20)
CALCIUM: 9 mg/dL (ref 8.9–10.3)
CHLORIDE: 106 mmol/L (ref 101–111)
CO2: 25 mmol/L (ref 22–32)
CREATININE: 0.82 mg/dL (ref 0.44–1.00)
GFR calc Af Amer: >60 mL/min (ref >60)
GFR calc non Af Amer: >60 mL/min (ref >60)
GLUCOSE: 106 mg/dL — AB (ref 65–99)
Potassium: 3.6 mmol/L (ref 3.5–5.1)
SODIUM: 141 mmol/L (ref 135–145)
Total Bilirubin: 0.7 mg/dL (ref 0.3–1.2)
Total Protein: 6 g/dL — ABNORMAL LOW (ref 6.5–8.1)

## 2017-07-26 LAB — URINALYSIS, ROUTINE W REFLEX MICROSCOPIC
BILIRUBIN URINE: NEGATIVE
Glucose, UA: NEGATIVE mg/dL
HGB URINE DIPSTICK: NEGATIVE
KETONES UR: NEGATIVE mg/dL
NITRITE: NEGATIVE
Protein, ur: NEGATIVE mg/dL
Specific Gravity, Urine: 1.021 (ref 1.005–1.030)
pH: 6 (ref 5.0–8.0)

## 2017-07-26 LAB — I-STAT TROPONIN, ED: Troponin i, poc: 0 ng/mL (ref 0.00–0.08)

## 2017-07-26 LAB — LIPASE, BLOOD: Lipase: 29 U/L (ref 11–51)

## 2017-07-26 MED ORDER — FENTANYL CITRATE (PF) 100 MCG/2ML IJ SOLN
50.0000 ug | Freq: Once | INTRAMUSCULAR | Status: AC
  Administered 2017-07-26: 50 ug via INTRAVENOUS
  Filled 2017-07-26: qty 2

## 2017-07-26 MED ORDER — ONDANSETRON HCL 4 MG/2ML IJ SOLN
4.0000 mg | Freq: Once | INTRAMUSCULAR | Status: AC
  Administered 2017-07-26: 4 mg via INTRAVENOUS
  Filled 2017-07-26: qty 2

## 2017-07-26 MED ORDER — IOPAMIDOL (ISOVUE-300) INJECTION 61%
INTRAVENOUS | Status: AC
  Administered 2017-07-26: 100 mL
  Filled 2017-07-26: qty 100

## 2017-07-26 NOTE — ED Provider Notes (Addendum)
[facility] EMERGENCY DEPARTMENT Provider Note   CSN: [id number] Arrival date & time: 07/26/17  1151     History   Chief Complaint Chief Complaint  Darlene Wilson presents with  . Seizures    HPI Darlene Wilson is a 84 y.o. Wilson.  HPI Darlene Wilson past medical history significant for hypertension, CVA, thyroid disease, CKD, depression, Alzheimer's presents to the emergency department today from nursing facility.  Darlene Wilson is not able to give thorough history.  Son at bedside states that he was notified by facility today that Darlene Wilson had a syncopal episode.  When I touch base with the facility they told me that Darlene Wilson became unresponsive this morning her eyes rolled back in her head.  They do not believe that Darlene Wilson lost consciousness.  Darlene Wilson did not fall this was while she was laying in bed.  Has no head injury.  Darlene Wilson states that she does not remember anything happening this morning.  She states that she did feel a little dizzy when she woke up.  Darlene Wilson denies any dizziness at this time.  Denies any associated chest pain or shortness of breath.  Darlene Wilson does complain of abdominal pain that is lower abdomen.  Darlene Wilson denies any associated nausea, vomiting or diarrhea. Denies any urinary symptoms.  Pt denies any fever, chill, ha, vision changes, lightheadedness, congestion, neck pain, cp, sob, cough, n/v/d, urinary symptoms, change in bowel habits, melena, hematochezia, lower extremity paresthesias.  Past Medical History:  Diagnosis Date  . Chronic kidney disease 1998   stones  . Depression with anxiety 09/14/2014  . Fibrocystic breast 01/12/2011  . Glaucoma    both eyes  . History of chicken pox   . Hypertension   . Insomnia 01/13/2011  . Kidney stone    stones   . Memory loss 10/12/2012  . Nausea 05/31/2012  . Spinal stenosis 06/14/2011  . Stroke (HCC) 04/2007   x 3 total  . Thyroid disease    hypothyroid  . Urinary frequency 02/09/2011  .  Vertigo 02/09/2011    Darlene Wilson Active Problem List   Diagnosis Date Noted  . Alzheimer disease 08/10/2016  . Malnutrition of moderate degree 03/10/2015  . Syncope 03/08/2015  . Glaucoma 03/08/2015  . Hearing loss 09/14/2014  . Depression with anxiety 09/14/2014  . Medicare annual wellness visit, subsequent 04/13/2014  . Hyperglycemia 10/06/2013  . Neck pain 11/20/2012  . Dizziness and giddiness 11/08/2012  . Degeneration of cervical intervertebral disc 11/08/2012  . Cervical spondylosis without myelopathy 11/08/2012  . Other malaise and fatigue 11/08/2012  . Disturbance of skin sensation 11/08/2012  . Hemiplegia of dominant side as late effect following cerebrovascular disease (HCC) 11/08/2012  . Other late effects of cerebrovascular disease(438.89) 11/08/2012  . Memory loss 10/12/2012  . Spinal stenosis 06/14/2011  . Urinary frequency 02/09/2011  . Vertigo 02/09/2011  . Insomnia 01/13/2011  . Fibrocystic breast 01/12/2011  . Hypertension   . Kidney stone   . Thyroid disease   . Stroke [facility]) 04/30/2007    Past Surgical History:  Procedure Laterality Date  . ABDOMINAL HYSTERECTOMY  1963  . BACK SURGERY  1989   discectomy L4-5 1989, good results  . BREAST SURGERY  1970   biopsy, benign, b/l    OB History    No data available       Home Medications    Prior to Admission medications   Medication Sig Start Date End Date Taking? Authorizing Provider  acetaminophen (TYLENOL) 325 MG tablet Take 2  tablets (650 mg total) by mouth every 6 (six) hours as needed. 11/21/12   Christiane HaSullivan, Corinna L, MD  amLODipine (NORVASC) 5 MG tablet Take 1 tablet (5 mg total) by mouth daily. 03/30/15   Bradd CanaryBlyth, Stacey A, MD  citalopram (CELEXA) 10 MG tablet Take 1 tablet (10 mg total) by mouth daily. 04/02/15   Bradd CanaryBlyth, Stacey A, MD  dipyridamole-aspirin (AGGRENOX) 200-25 MG per 12 hr capsule TAKE 1 CAPSULE BY MOUTH 2 (TWO) TIMES DAILY. 09/30/14   Bradd CanaryBlyth, Stacey A, MD  donepezil (ARICEPT) 10 MG tablet  Take 1 tablet (10 mg total) by mouth at bedtime. 09/08/14   Bradd CanaryBlyth, Stacey A, MD  doxycycline (VIBRAMYCIN) 100 MG capsule Take 1 capsule (100 mg total) by mouth 2 (two) times daily. 08/17/16   Copland, Gwenlyn FoundJessica C, MD  gabapentin (NEURONTIN) 100 MG capsule TAKE 1 CAPSULE (100 MG TOTAL) BY MOUTH AT BEDTIME. 04/10/15   Bradd CanaryBlyth, Stacey A, MD  latanoprost (XALATAN) 0.005 % ophthalmic solution Place 1 drop into both eyes at bedtime. 05/02/14   Bradd CanaryBlyth, Stacey A, MD  levothyroxine (SYNTHROID, LEVOTHROID) 75 MCG tablet Take 1 tablet (75 mcg total) by mouth daily. 06/25/14   Bradd CanaryBlyth, Stacey A, MD  lisinopril (PRINIVIL,ZESTRIL) 10 MG tablet 1 tab po bid Darlene Wilson taking differently: Take 10 mg by mouth daily.  11/14/14   Saguier, Ramon DredgeEdward, PA-C  loperamide (IMODIUM A-D) 2 MG tablet Take 2 mg by mouth 4 (four) times daily as needed for diarrhea or loose stools.    [provider]  ondansetron (ZOFRAN) 4 MG tablet Take 4 mg by mouth every 8 (eight) hours as needed for nausea or vomiting.    [provider]  timolol (BETIMOL) 0.5 % ophthalmic solution Place 1 drop into both eyes every morning.      [provider]    Family History Family History  Problem Relation Age of Onset  . Heart disease Mother   . Hypertension Mother   . Diabetes Mother   . Heart disease Father   . Hypertension Father   . Cancer Sister        breast cancer  . Mental illness Brother        service connected    Social History Social History   Tobacco Use  . Smoking status: Never Smoker  . Smokeless tobacco: Never Used  Substance Use Topics  . Alcohol use: No  . Drug use: No     Allergies   Ciprofloxacin; Combigan [brimonidine tartrate-timolol]; Detrol [tolterodine tartrate]; Oxycodone; Penicillins; and Sulfa antibiotics   Review of Systems Review of Systems  Constitutional: Negative for chills and fever.  HENT: Negative for congestion.   Eyes: Negative for visual disturbance.  Respiratory: Negative  for cough and shortness of breath.   Cardiovascular: Negative for chest pain.  Gastrointestinal: Positive for abdominal pain. Negative for diarrhea, nausea and vomiting.  Genitourinary: Negative for dysuria, flank pain, frequency, hematuria, urgency, vaginal bleeding and vaginal discharge.  Musculoskeletal: Negative for arthralgias and myalgias.  Skin: Negative for rash.  Neurological: Positive for dizziness. Negative for syncope, weakness, light-headedness, numbness and headaches.       Episode of unresponsive  Psychiatric/Behavioral: Negative for sleep disturbance. The Darlene Wilson is not nervous/anxious.      Physical Exam Updated Vital Signs BP 129/68 (BP Location: Right Arm)   Pulse 80   Temp 98.1 F (36.7 C) (Oral)   Resp 16   Ht 5\' 4"  (1.626 m)   Wt 61.2 kg (135 lb)   SpO2 96%  BMI 23.17 kg/m   Physical Exam  Constitutional: She is oriented to person, place, and time. She appears well-developed and well-nourished.  Non-toxic appearance. No distress.  HENT:  Head: Normocephalic and atraumatic.  Nose: Nose normal.  Mouth/Throat: Oropharynx is clear and moist.  Eyes: Conjunctivae are normal. Pupils are equal, round, and reactive to light. Right eye exhibits no discharge. Left eye exhibits no discharge.  Neck: Normal range of motion. Neck supple.  Cardiovascular: Normal rate, regular rhythm, normal heart sounds and intact distal pulses. Exam reveals no gallop and no friction rub.  No murmur heard. Pulses are equal in all extremities bilaterally and extremities are warm to touch.  Pulmonary/Chest: Effort normal and breath sounds normal. No stridor. No respiratory distress. She has no wheezes. She has no rales. She exhibits no tenderness.  Abdominal: Soft. Bowel sounds are normal. There is generalized tenderness. There is guarding. There is no rigidity, no rebound, no CVA tenderness, no tenderness at McBurney's point and negative Murphy's sign.  Musculoskeletal: Normal range of  motion. She exhibits no tenderness.  Lymphadenopathy:    She has no cervical adenopathy.  Neurological: She is alert and oriented to person, place, and time.  Darlene Wilson oriented to person and place but not time which is baseline for Darlene Wilson given her dementia.  Follows commands appropriately.  Grip strength equal.  Radial nerves II through XII are grossly intact.  No ataxia with finger to nose.  Strength is normal in lower extremities.  Muscle tone is normal.  Skin: Skin is warm and dry. Capillary refill takes less than 2 seconds.  Psychiatric: Her behavior is normal. Judgment and thought content normal.  Nursing note and vitals reviewed.    ED Treatments / Results  Labs (all labs ordered are listed, but only abnormal results are displayed) Labs Reviewed  COMPREHENSIVE METABOLIC PANEL - Abnormal; Notable for the following components:      Result Value   Glucose, Bld 106 (*)    Total Protein 6.0 (*)    ALT 11 (*)    All other components within normal limits  URINALYSIS, ROUTINE W REFLEX MICROSCOPIC - Abnormal; Notable for the following components:   APPearance CLOUDY (*)    Leukocytes, UA SMALL (*)    Bacteria, UA RARE (*)    Squamous Epithelial / LPF 0-5 (*)    All other components within normal limits  CBC WITH DIFFERENTIAL/PLATELET  LIPASE, BLOOD  I-STAT TROPONIN, ED    EKG  EKG Interpretation  Date/Time:  Wednesday July 26 2017 12:04:56 EST Ventricular Rate:  71 PR Interval:  202 QRS Duration: 80 QT Interval:  328 QTC Calculation: 356 R Axis:   21 Text Interpretation:  Normal sinus rhythm T wave abnormality Artifact Abnormal ekg Confirmed by Gerhard Munch [phone]) on 07/26/2017 12:44:02 PM       Radiology Dg Chest 2 View  Result Date: 07/26/2017 CLINICAL DATA:  Syncopal episode today. EXAM: CHEST  2 VIEW COMPARISON:  CT chest and single view of the chest 10/20/2016. FINDINGS: Lung volumes are lower than on the comparison study. Lungs are clear. No pneumothorax  or pleural effusion. Heart size is normal. Aortic atherosclerosis is noted. No acute bony abnormality. IMPRESSION: No acute disease. Atherosclerosis. Electronically Signed   By: Drusilla Kanner M.D.   On: 07/26/2017 13:02   Ct Head Wo Contrast  Result Date: 07/26/2017 CLINICAL DATA:  Seizure like activity, shaking for 1 minutes, history Alzheimer's, stroke EXAM: CT HEAD WITHOUT CONTRAST TECHNIQUE: Contiguous axial images were obtained from  the base of the skull through the vertex without intravenous contrast. Sagittal and coronal MPR images reconstructed from axial data set. COMPARISON:  03/08/2015 FINDINGS: Brain: Motion artifacts, for which repeat imaging was performed. Generalized atrophy. Ex vacuo dilatation of the ventricular system. No midline shift or mass effect. Small vessel chronic ischemic changes of deep cerebral white matter. No intracranial hemorrhage, mass lesion, evidence of acute infarction, or extra-axial fluid collection. Vascular: Atherosclerotic calcification of internal carotid arteries at skull base Skull: Intact Sinuses/Orbits: Tiny amount of fluid in sphenoid sinus. Remaining visualized paranasal sinuses and mastoid air cells clear Other: N/A IMPRESSION: Atrophy with small vessel chronic ischemic changes of deep cerebral white matter. No acute intracranial abnormalities. Electronically Signed   By: Ulyses Southward M.D.   On: 07/26/2017 13:21   Ct Abdomen Pelvis W Contrast  Result Date: 07/26/2017 CLINICAL DATA:  Lower abdominal pain, history of kidney stones EXAM: CT ABDOMEN AND PELVIS WITH CONTRAST TECHNIQUE: Multidetector CT imaging of the abdomen and pelvis was performed using the standard protocol following bolus administration of intravenous contrast. CONTRAST:  ISOVUE-300 IOPAMIDOL (ISOVUE-300) INJECTION 61% COMPARISON:  None. FINDINGS: Lower chest: Right lower lobe scarring/atelectasis. Hepatobiliary: Liver is within normal limits. Gallbladder is unremarkable. No  intrahepatic or extrahepatic duct dilatation. Pancreas: Within normal limits. Spleen: Within normal limits. Adrenals/Urinary Tract: Adrenal glands within normal limits. Bilateral renal sinus cysts.  No hydronephrosis. Bladder is within normal limits. Stomach/Bowel: Stomach is within normal limits. No evidence of bowel obstruction. Appendix is not discretely visualized. Extensive sigmoid diverticulosis, without evidence of diverticulitis. Vascular/Lymphatic: No evidence of abdominal aortic aneurysm. Atherosclerotic calcifications of the abdominal aorta and branch vessels. No suspicious abdominopelvic lymphadenopathy. Reproductive: Status post hysterectomy. No adnexal masses. Other: No abdominopelvic ascites. Musculoskeletal: Degenerative changes of the lumbar spine. IMPRESSION: Sigmoid diverticulosis, without evidence of diverticulitis. No CT findings to account for the Darlene Wilson's chronic abdominal pain. Electronically Signed   By: Charline Bills M.D.   On: 07/26/2017 17:01    Procedures Procedures (including critical care time)  Medications Ordered in ED Medications  ondansetron (ZOFRAN) injection 4 mg (4 mg Intravenous Given 07/26/17 1440)  fentaNYL (SUBLIMAZE) injection 50 mcg (50 mcg Intravenous Given 07/26/17 1442)  iopamidol (ISOVUE-300) 61 % injection (100 mLs  Contrast Given 07/26/17 1637)     Initial Impression / Assessment and Plan / ED Course  I have reviewed the triage vital signs and the nursing notes.  Pertinent labs & imaging results that were available during my care of the Darlene Wilson were reviewed by me and considered in my medical decision making (see chart for details).  Clinical Course as of Jul 27 549  Wed Jul 26, 2017  1737 Spoke with hospitalist who reports will come see Darlene Wilson and evaluate. Does not feel like this appears to warrant admission but will come see Darlene Wilson and decide.   [EH]  1752 Spoke with Dr. Revonda Humphrey who evaluated Darlene Wilson, recommends discharge and will put a  note in the chart.   [EH]    Clinical Course User Index [EH] Cristina Gong, PA-C    Darlene Wilson presents the ED by EMS from nursing facility for possible altered mental status with LOC for seizure-like activity.  Darlene Wilson is alert oriented at this time at baseline with history of Alzheimer's and dementia.  With Darlene Wilson's did complain of some dizziness this morning when she woke up.  Denies any associated chest pain or shortness of breath.  The Darlene Wilson at this time complains of abdominal pain.  Denies any other  associated symptoms.  Vital signs are reassuring.  Darlene Wilson is afebrile, no tachycardia, hypotension noted.  Lungs clear to auscultation bilaterally.  Heart regular rate and rhythm.  No focal neuro deficit on exam.  Darlene Wilson appears to be at baseline per family.  She does have significant tenderness in her lower abdomen to palpation.  Guarding noted.  No signs of peritonitis.  Lab work are reassuring.  No leukocytosis.  Elect lites are.  Darlene Wilson's baseline.  Normal lipase.  Troponin was negative.  UA does not show any septic signs of infection.  EKG shows normal sinus rhythm without any signs of ischemia.  Chest x-ray shows no acute findings.  CT of head shows no acute findings.  Abdomen pelvis contrasted CT scan is pending at this time given her extreme abdominal tenderness.  Given Darlene Wilson's possible altered mental status with LOC versus seizure-like activity Darlene Wilson would benefit from admission.  Will wait for CT scan then called for admission.  Care handoff to PA [facility]. Pt has pending at this time  Ct scan and admssion.  Disposition likely admission pending lab and test results. Care dicussed and plan agreed upon with oncoming PA. Pt updated on plan of care and is currently hemodynamically stable at this time with normal vs.     Final Clinical Impressions(s) / ED Diagnoses   Final diagnoses:  Altered mental status, unspecified altered mental status type  Generalized abdominal pain     ED Discharge Orders    None       Rise Mu, PA-C 07/26/17 1616    Rise Mu, PA-C 07/27/17 0551    Gerhard Munch, MD 07/29/17 [phone]

## 2017-07-26 NOTE — ED Provider Notes (Signed)
Assumed patient care at shift change, briefly she is a 84 year old woman here for evaluation after a syncopal event.  She was reportedly laying in bed when her eyes "rolled back in her head'and she became unresponsive.  She has already had CT head with out significant finding.  She continues to report feeling dizzy.  Plan to follow up on CT abdomen and admit to triad for continued evaluation.    CT abdomen/pelvis with out acute abnormality.  I evaluated the patient, she is alert and oriented to person, and place, not time which, according to daughter in law at bedside, is her normal.  She denies any pains at this time, reports feeling slightly dizzy.  Respirations are even and unlabored, she is in no obvious distress.    Clinical Course as of Jul 27 118  Wed Jul 26, 2017  1737 Spoke with hospitalist who reports will come see patient and evaluate. Does not feel like this appears to warrant admission but will come see patient and decide.   [EH]  1752 Spoke with Dr. Revonda Humphreyanforth who evaluated patient, recommends discharge and will put a note in the chart.   [EH]    Clinical Course User Index [EH] Cristina GongHammond, Kaesen Rodriguez W, PA-C    After evaluation by Dr. Revonda Humphreyanforth patient will be discharged home with outpatient follow-up.   Cristina GongHammond, Geoffrey Hynes W, PA-C 07/27/17 0121    Gerhard MunchLockwood, Robert, MD 07/29/17 [phone]

## 2017-07-26 NOTE — ED Notes (Signed)
Pt ambulatory to restroom with cane and family by side. Urine collected and pt now back in bed safely.

## 2017-07-26 NOTE — Discharge Instructions (Signed)
Please follow up with your doctor.  Please return to the emergency room if any repeat episodes or you have any concerns.

## 2017-07-26 NOTE — ED Triage Notes (Addendum)
Per [facility] patient coming from [facility] with a complaint of "seizure- like" activity, staff reports patient "shaking for 1 min". No hx of seizures. Alzheimer history. A&O x3. Baseline is confusion. Hx CVA. No neuro deficits.

## 2017-07-26 NOTE — Consult Note (Signed)
Hospitalist Service Medical Consultation   Darlene Wilson  YQM:[id number]  DOB: 10-30-1926  DOA: 07/26/2017  PCP: Bradd CanaryBlyth, Stacey A, MD      Requesting physician: Dr. Jeraldine LootsHammond  Reason for consultation: Syncope   History of Present Illness: Darlene Wilson is an 84 y.o. female with history Alzheimer's in ALF, recurrent TIA, and hypothyroidism who presents with syncopal episode.  Unfortunately patient unable to remember episode at all, and family at bedside were never briefed on events, so history obtained completely via EMS through EDP.  They report patient had a brief episode this morning when she lost consciousness.  Evidently she was lying in bed, was witnessed to have her eyes rolled back in her head, and was briefly unresponsive to voice, but "they do not believe that patient lost consciousness".  There is no report of tongue biting.  She recovered immediately after and was transported to the ER.  There has been no recent vomiting, diarrhea, decreased PO intake.  She does not complain of chest pain, palpitations, dyspnea, nor are family aware that this has been a recent problem.  ED course: -Afebrile, heart rate 67, respirations and pulse ox normal, blood pressure 117/54 -Sodium 141, potassium 3.6, creatinine 0.8 -WBC 9.1K, hemoglobin 13.2 -Lipase normal -Troponin negative -Urinalysis without leukocytes or red blood cells -Chest x-ray clear -CT head unremarkable -CT of the abdomen and pelvis was obtained because of some crampy abdominal pain in the ER, and this showed no acute disease -An ECG was obtained that showed normal sinus rhythm, rate 71, normal QTC and QRS intervals, no ST changes      Review of Systems:  As per HPI otherwise 10 systems were reviewed and were negative.   Past Medical History: Past Medical History:  Diagnosis Date  . Chronic kidney disease 1998   stones  . Depression with anxiety 09/14/2014  . Fibrocystic breast 01/12/2011  .  Glaucoma    both eyes  . History of chicken pox   . Hypertension   . Insomnia 01/13/2011  . Kidney stone    stones   . Memory loss 10/12/2012  . Nausea 05/31/2012  . Spinal stenosis 06/14/2011  . Stroke (HCC) 04/2007   x 3 total  . Thyroid disease    hypothyroid  . Urinary frequency 02/09/2011  . Vertigo 02/09/2011    Past Surgical History: Past Surgical History:  Procedure Laterality Date  . ABDOMINAL HYSTERECTOMY  1963  . BACK SURGERY  1989   discectomy L4-5 1989, good results  . BREAST SURGERY  1970   biopsy, benign, b/l     Allergies:   Allergies  Allergen Reactions  . Ciprofloxacin Hives    Given with 2 other meds/unsure if truly allergic  . Combigan [Brimonidine Tartrate-Timolol] Other (See Comments)    Inflamation  . Detrol [Tolterodine Tartrate] Hives    Given with 2 other medications/unsure if truly allergic  . Oxycodone Hives    Given with 2 other medications/unsure if truly allergic  . Penicillins Hives, Itching and Rash  . Sulfa Antibiotics Hives, Itching and Rash     Social History:  reports that  has never smoked. she has never used smokeless tobacco. She reports that she does not drink alcohol or use drugs.   Family History: Family History  Problem Relation Age of Onset  . Heart disease Mother   . Hypertension Mother   . Diabetes Mother   .  Heart disease Father   . Hypertension Father   . Cancer Sister        breast cancer  . Mental illness Brother        service connected     Physical Exam: Vitals:   07/26/17 1205 07/26/17 1216 07/26/17 1218 07/26/17 1439  BP:    (!) 134/58  Pulse:    76  Resp:    18  Temp:      TempSrc:      SpO2: 95% 95%  97%  Weight:   61.2 kg (135 lb)   Height:   5\' 4"  (1.626 m)     Constitutional: Alert and awake, oriented x3, not in any acute distress. Sitting in bed. Eyes: PERLA, EOMI, irises appear normal, anicteric sclera,  ENMT: external ears and nose appear normal, hearing good            Lips  appears normal, oropharynx mucosa, tongue, posterior pharynx appear normal  Neck: neck appears normal, no masses, normal ROM, no thyromegaly, no JVD  CVS: S1-S2 clear, no murmur rubs or gallops, no LE edema, normal pedal pulses  Respiratory:  clear to auscultation bilaterally, no wheezing, rales or rhonchi. Respiratory effort normal. No accessory muscle use.  GI: soft minimal tenderness throughout, no guarding, no rebound, nondistended, normal bowel sounds, no hepatosplenomegaly, no hernias  Musculoskeletal: no cyanosis, clubbing or edema noted bilaterally Neuro: Cranial nerves II-XII intact, strength, sensation, fluency; not oriented to place, year, month, unable to remember any details from earlier today nor even earlier in ER stay, which is baseline per daughter Psych: judgement and insight appear impaired by dementia, stable mood and affect, mental status Skin: no rashes or lesions or ulcers, no induration or nodules    Data reviewed:  I have personally reviewed following labs and imaging studies Labs:  CBC: Recent Labs  Lab 07/26/17 1335  WBC 9.1  NEUTROABS 7.4  HGB 13.2  HCT 39.9  MCV 98.8  PLT 253    Basic Metabolic Panel: Recent Labs  Lab 07/26/17 1335  NA 141  K 3.6  CL 106  CO2 25  GLUCOSE 106*  BUN 12  CREATININE 0.82  CALCIUM 9.0   GFR Estimated Creatinine Clearance: 41 mL/min (by C-G formula based on SCr of 0.82 mg/dL). Liver Function Tests: Recent Labs  Lab 07/26/17 1335  AST 18  ALT 11*  ALKPHOS 95  BILITOT 0.7  PROT 6.0*  ALBUMIN 3.6   Recent Labs  Lab 07/26/17 1335  LIPASE 29   No results for input(s): AMMONIA in the last 168 hours. Coagulation profile No results for input(s): INR, PROTIME in the last 168 hours.  Cardiac Enzymes: No results for input(s): CKTOTAL, CKMB, CKMBINDEX, TROPONINI in the last 168 hours. BNP: Invalid input(s): POCBNP CBG: No results for input(s): GLUCAP in the last 168 hours. D-Dimer No results for input(s):  DDIMER in the last 72 hours. Hgb A1c No results for input(s): HGBA1C in the last 72 hours. Lipid Profile No results for input(s): CHOL, HDL, LDLCALC, TRIG, CHOLHDL, LDLDIRECT in the last 72 hours. Thyroid function studies No results for input(s): TSH, T4TOTAL, T3FREE, THYROIDAB in the last 72 hours.  Invalid input(s): FREET3 Anemia work up No results for input(s): VITAMINB12, FOLATE, FERRITIN, TIBC, IRON, RETICCTPCT in the last 72 hours. Urinalysis    Component Value Date/Time   COLORURINE YELLOW 07/26/2017 1509   APPEARANCEUR CLOUDY (A) 07/26/2017 1509   LABSPEC 1.021 07/26/2017 1509   PHURINE 6.0 07/26/2017 1509   GLUCOSEU  NEGATIVE 07/26/2017 1509   HGBUR NEGATIVE 07/26/2017 1509   BILIRUBINUR NEGATIVE 07/26/2017 1509   BILIRUBINUR neg 05/31/2012 1112   KETONESUR NEGATIVE 07/26/2017 1509   PROTEINUR NEGATIVE 07/26/2017 1509   UROBILINOGEN 1.0 03/08/2015 1526   NITRITE NEGATIVE 07/26/2017 1509   LEUKOCYTESUR SMALL (A) 07/26/2017 1509     Sepsis Labs Invalid input(s): PROCALCITONIN,  WBC,  LACTICIDVEN Microbiology No results found for this or any previous visit (from the past 240 hour(s)).       Radiological Exams on Admission: The CXR was personally reviewed and shows no focal findings.  The CT head and abdomen reports were reviewed Dg Chest 2 View  Result Date: 07/26/2017 CLINICAL DATA:  Syncopal episode today. EXAM: CHEST  2 VIEW COMPARISON:  CT chest and single view of the chest 10/20/2016. FINDINGS: Lung volumes are lower than on the comparison study. Lungs are clear. No pneumothorax or pleural effusion. Heart size is normal. Aortic atherosclerosis is noted. No acute bony abnormality. IMPRESSION: No acute disease. Atherosclerosis. Electronically Signed   By: Drusilla Kanner M.D.   On: 07/26/2017 13:02   Ct Head Wo Contrast  Result Date: 07/26/2017 CLINICAL DATA:  Seizure like activity, shaking for 1 minutes, history Alzheimer's, stroke EXAM: CT HEAD WITHOUT  CONTRAST TECHNIQUE: Contiguous axial images were obtained from the base of the skull through the vertex without intravenous contrast. Sagittal and coronal MPR images reconstructed from axial data set. COMPARISON:  03/08/2015 FINDINGS: Brain: Motion artifacts, for which repeat imaging was performed. Generalized atrophy. Ex vacuo dilatation of the ventricular system. No midline shift or mass effect. Small vessel chronic ischemic changes of deep cerebral white matter. No intracranial hemorrhage, mass lesion, evidence of acute infarction, or extra-axial fluid collection. Vascular: Atherosclerotic calcification of internal carotid arteries at skull base Skull: Intact Sinuses/Orbits: Tiny amount of fluid in sphenoid sinus. Remaining visualized paranasal sinuses and mastoid air cells clear Other: N/A IMPRESSION: Atrophy with small vessel chronic ischemic changes of deep cerebral white matter. No acute intracranial abnormalities. Electronically Signed   By: Ulyses Southward M.D.   On: 07/26/2017 13:21   Ct Abdomen Pelvis W Contrast  Result Date: 07/26/2017 CLINICAL DATA:  Lower abdominal pain, history of kidney stones EXAM: CT ABDOMEN AND PELVIS WITH CONTRAST TECHNIQUE: Multidetector CT imaging of the abdomen and pelvis was performed using the standard protocol following bolus administration of intravenous contrast. CONTRAST:  ISOVUE-300 IOPAMIDOL (ISOVUE-300) INJECTION 61% COMPARISON:  None. FINDINGS: Lower chest: Right lower lobe scarring/atelectasis. Hepatobiliary: Liver is within normal limits. Gallbladder is unremarkable. No intrahepatic or extrahepatic duct dilatation. Pancreas: Within normal limits. Spleen: Within normal limits. Adrenals/Urinary Tract: Adrenal glands within normal limits. Bilateral renal sinus cysts.  No hydronephrosis. Bladder is within normal limits. Stomach/Bowel: Stomach is within normal limits. No evidence of bowel obstruction. Appendix is not discretely visualized. Extensive sigmoid  diverticulosis, without evidence of diverticulitis. Vascular/Lymphatic: No evidence of abdominal aortic aneurysm. Atherosclerotic calcifications of the abdominal aorta and branch vessels. No suspicious abdominopelvic lymphadenopathy. Reproductive: Status post hysterectomy. No adnexal masses. Other: No abdominopelvic ascites. Musculoskeletal: Degenerative changes of the lumbar spine. IMPRESSION: Sigmoid diverticulosis, without evidence of diverticulitis. No CT findings to account for the patient's chronic abdominal pain. Electronically Signed   By: Charline Bills M.D.   On: 07/26/2017 17:01   The ECG was personally reviewed and shows rate 71, normal sinus rhythm, normal intervals.    Impression/Recommendations  #1 possible syncope: It is unclear what happened, patient has no recollection of the event.  What appears  to be related is a spontaneous loss of consciousness, followed by immediate recovery, which would be most typical of a vagal event, even though she was laying down.  She has normal electro lites and renal function.  Her vital signs are completely within normal limits.  She has normal neuraxial imaging, normal ECG.  She has no previous coronary disease.  Canadian syncope risk score is 0, and she is a low risk patient. -No further work up at this time -Follow up with PCP if events recur for ECG and possible Holter    Thank you for this consultation.  Our [facility] hospitalist team will follow the patient with you.     Alberteen Sam M.D. [facility] 07/26/2017, 5:54 PM

## 2017-08-14 ENCOUNTER — Telehealth: Payer: Self-pay | Admitting: *Deleted

## 2017-08-14 NOTE — Telephone Encounter (Signed)
Received Physician Orders from [facility]; forwarded to provider/SLS 03/18

## 2017-08-21 ENCOUNTER — Ambulatory Visit (INDEPENDENT_AMBULATORY_CARE_PROVIDER_SITE_OTHER): Payer: Medicare Other | Admitting: Adult Health

## 2017-08-21 ENCOUNTER — Encounter: Payer: Self-pay | Admitting: Adult Health

## 2017-08-21 VITALS — BP 113/74 | HR 87 | Wt 141.6 lb

## 2017-08-21 DIAGNOSIS — G301 Alzheimer's disease with late onset: Secondary | ICD-10-CM

## 2017-08-21 DIAGNOSIS — F02818 Dementia in other diseases classified elsewhere, unspecified severity, with other behavioral disturbance: Secondary | ICD-10-CM

## 2017-08-21 DIAGNOSIS — F0281 Dementia in other diseases classified elsewhere with behavioral disturbance: Secondary | ICD-10-CM | POA: Diagnosis not present

## 2017-08-21 NOTE — Progress Notes (Signed)
Marland Kitchen.    PATIENT: Darlene SchmidtJanie B Wilson DOB: 08-Apr-1927  REASON FOR VISIT: follow up HISTORY FROM: patient  HISTORY OF PRESENT ILLNESS: Ms. Darlene RiisWoodrow, 84 year old white female returns today for followup. She was first seen in this office 10/12/11 by Dr. Pearlean BrownieSethi. She has a history of TIA and is on Aggrenox. She has a history of 3 TIAs in the past. She went to the emergency room on Oct 14, 2009 for some weakness, numbness in her lip and tingling in the feet, it was characterized as generalized weakness. Her lab values were normal. MRI of the brain was found to be negative for any acute event. Mild to moderate small vessel disease changes. Vascular risk factors include hypertension and history of TIA's in addition to a history of hypothyroidism. Patient was started on Aricept 10mg  daily for dementia in 2015.  History 08/12/2015 (PS): She returns for follow-up after last visit 6 months ago. She is accompanied by her daughter-in-law Darlene BasqueBecky today. [facility]. She remains on Aggrenox which is tolerating well as well as Aricept 10 mg daily. The patient did not do well on cognitive testing today and scored only 15/30 compared to 21/30 at last visit but patient and daughter-in-law feel that she is just had a bad day today. They are reluctant in considering increasing the dose of Aricept or adding Namenda at the current time History 08/10/2016 (PS) : She returns for follow-up after last visit 1 year ago. She is accompanied by her daughter. [facility]. She is tolerating Aricept 10 mg a day well without side effects. Family has refused to consider increasing the dose and prior visits. She remains on Aggrenox for stroke prevention and has not had any recurrent stroke or TIA symptoms. Her blood pressure is well controlled and today it is 115/60. She has no other new complaints   UPDATE 08/21/17: Patient is being seen today  for a one-year follow-up was accompanied by her daughter-in-law.  Patient states she is doing well overall without any decline in her memory.  MMSE today 12/30 (previously 14/30 10/2016). [facility] and continues to do well there.  She does state that she can get agitated at times especially in the evening and just wants to be left alone.  Continues to take Aricept 10 mg daily without side effects.  Daughter-in-law considering  increasing dose or possibly starting Namenda and medication such as Seroquel to help with dementia related agitation.  Patient continues Aggrenox for stroke prevention without increase in bleeding or bruising.  Her blood pressure is satisfactory at today's visit 113/74.  Denies new or worsening stroke/TIA symptoms.   REVIEW OF SYSTEMS: Full 14 system review of systems performed and notable only for:  Memory loss, gait difficulties, confusion, cough and all other systems negative  ALLERGIES: Allergies  Allergen Reactions  . Ciprofloxacin Hives    Given with 2 other meds/unsure if truly allergic  . Combigan [Brimonidine Tartrate-Timolol] Other (See Comments)    Inflamation  . Detrol [Tolterodine Tartrate] Hives    Given with 2 other medications/unsure if truly allergic  . Oxycodone Hives    Given with 2 other medications/unsure if truly allergic  . Penicillins Hives, Itching and Rash  . Sulfa Antibiotics Hives, Itching and Rash    HOME MEDICATIONS: Outpatient Medications Prior to Visit  Medication Sig Dispense Refill  . acetaminophen (TYLENOL) 325 MG tablet Take 2  tablets (650 mg total) by mouth every 6 (six) hours as needed.    Marland Kitchen amLODipine (NORVASC) 5 MG tablet Take 1 tablet (5 mg total) by mouth daily. 30 tablet 6  . citalopram (CELEXA) 10 MG tablet Take 1 tablet (10 mg total) by mouth daily. 90 tablet 2  . dipyridamole-aspirin (AGGRENOX) 200-25 MG per 12 hr capsule TAKE 1 CAPSULE BY MOUTH 2 (TWO) TIMES  DAILY. 180 capsule 2  . donepezil (ARICEPT) 10 MG tablet Take 1 tablet (10 mg total) by mouth at bedtime. 90 tablet 1  . doxycycline (VIBRAMYCIN) 100 MG capsule Take 1 capsule (100 mg total) by mouth 2 (two) times daily. 20 capsule 0  . gabapentin (NEURONTIN) 100 MG capsule TAKE 1 CAPSULE (100 MG TOTAL) BY MOUTH AT BEDTIME. 90 capsule 1  . latanoprost (XALATAN) 0.005 % ophthalmic solution Place 1 drop into both eyes at bedtime. 2.5 mL 0  . levothyroxine (SYNTHROID, LEVOTHROID) 75 MCG tablet Take 1 tablet (75 mcg total) by mouth daily. 90 tablet 1  . lisinopril (PRINIVIL,ZESTRIL) 10 MG tablet 1 tab po bid (Patient taking differently: Take 10 mg by mouth daily. ) 60 tablet 0  . loperamide (IMODIUM A-D) 2 MG tablet Take 2 mg by mouth 4 (four) times daily as needed for diarrhea or loose stools.    . ondansetron (ZOFRAN) 4 MG tablet Take 4 mg by mouth every 8 (eight) hours as needed for nausea or vomiting.    . timolol (BETIMOL) 0.5 % ophthalmic solution Place 1 drop into both eyes every morning.       No facility-administered medications prior to visit.      PHYSICAL EXAM  Vitals:   08/21/17 1508  BP: 113/74  Pulse: 87  Weight: 141 lb 9.6 oz (64.2 kg)   Body mass index is 24.31 kg/m.  Generalized:  pleasant frail elderly Caucasian female  Neck: Supple, no carotid bruits  Cardiac: Regular rate rhythm, no murmur Skin: Few petechiae   Neurologic Exam  Mental Status: Awake and fully alert. Follows 1,2 step commands. Mood and affect appropriate. No aphasia, apraxia or dysarthia.  MMSE - Mini Mental State Exam 08/21/2017 11/17/2016  Orientation to time 1 0  Orientation to Place 2 2  Registration 3 3  Attention/ Calculation 0 1  Recall 0 1  Language- name 2 objects 2 2  Language- repeat 0 1  Language- follow 3 step command 2 2  Language- read & follow direction 1 1  Write a sentence 1 1  Copy design 0 0  Total score 12 14   Cranial Nerves: Pupils equal, briskly reactive to light.  Extraocular movements full without nystagmus. Visual fields full to confrontation. Hearing intact and symmetric to finger snap. Facial sensation intact. Face, tongue, palate move normally and symmetrically.  Motor: Normal bulk and tone. Normal strength in all tested extremity muscles.No drift.  Sensory: Intact to touch and temperature, pinprick and vibratory in all extremities.  Coordination: Rapid alternating movements normal in all extremities. Finger-to-nose and heel-to-shin performed accurately bilaterally.  Gait and Station: Arises from chair without difficulty. Stance is wide based.uses a cane to walk. Unable to heel, toe, and slight difficulty with tandem. Romberg negative.    Reflexes: 1+ and symmetric. Toes downgoing.   ASSESSMENT AND PLAN 84 year old female with a history of multiple TIA's most recent Oct 14, 2009. Vascular risk factors inclued hypertension and previous TIA's. Headaches and paresthesias have improved. 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   Patient and daughter-in-law I  have agreed to hold off on starting Namenda or medication such as Seroquel to help with sundowning agitation.  Daughter-in-law states that she will talk to her husband and they will discuss possibly starting Seroquel or Namenda and will call us if interested.  Patient will continue to take Aricept 10 mg daily.  Continue to ensure blood pressure less than 130/90.  Continue LDL less than 70.  Continue to be active and eating healthy diet.  Continue Aggrenox for secondary stroke prevention.  Schedule follow-up appointment for 1 year or call earlier if needed  Greater than 50% time during this 25 minute consultation visit was spent on counseling and coordination of care about cognitive impairment and agitation.  Discussed risk and benefits of starting Namenda and Seroquel.   George Hugh, AGNP-BC  [facility] [address] [place], [place]  [address]  Phone [phone] Fax [phone]

## 2017-08-21 NOTE — Patient Instructions (Addendum)
Your Plan:  Continue Aricept 10mg  daily  Consider adding Namenda for additional memory support  Consider adding Seroquel for sun downing agitation    Follow up in 1 year or call earlier if needed      Thank you for coming to see us at [facility]. I hope we have been able to provide you high quality care today.  You may receive a patient satisfaction survey over the next few weeks. We would appreciate your feedback and comments so that we may continue to improve ourselves and the health of our patients.

## 2017-08-21 NOTE — Progress Notes (Signed)
I agree with the above plan 

## 2017-08-22 ENCOUNTER — Telehealth: Payer: Self-pay | Admitting: Family Medicine

## 2017-08-22 DIAGNOSIS — R062 Wheezing: Secondary | ICD-10-CM | POA: Diagnosis not present

## 2017-08-22 NOTE — Telephone Encounter (Unsigned)
Copied from CRM [id number]. Topic: Quick Communication - See Telephone Encounter >> Aug 22, 2017  9:31 AM Floria RavelingStovall, Shana A wrote: CRM for notification. See Telephone encounter for: 08/22/17. Devonya from [facility] [place] [phone] She has questions about an order that was faxed over.  She would like the nurse to call when she get a chance

## 2017-08-25 NOTE — Telephone Encounter (Signed)
Notify CXR normal. No treatment if she is symptomatically stable.

## 2017-08-25 NOTE — Telephone Encounter (Signed)
[facility] to [facility], stamped form and faxed back.

## 2017-08-25 NOTE — Telephone Encounter (Signed)
[facility], Normal chest x-ray; forwarded to provider's assistant [provider out of office until [place], 04/01//SLS 03/29

## 2017-09-21 DIAGNOSIS — R0789 Other chest pain: Secondary | ICD-10-CM | POA: Diagnosis not present

## 2017-09-29 ENCOUNTER — Telehealth: Payer: Self-pay | Admitting: *Deleted

## 2017-09-29 NOTE — Telephone Encounter (Signed)
Received Physician Orders from [facility]; forwarded to provider/SLS 05/03

## 2017-11-14 DIAGNOSIS — F0390 Unspecified dementia without behavioral disturbance: Secondary | ICD-10-CM | POA: Diagnosis not present

## 2017-11-14 DIAGNOSIS — I10 Essential (primary) hypertension: Secondary | ICD-10-CM | POA: Diagnosis not present

## 2017-11-14 DIAGNOSIS — R269 Unspecified abnormalities of gait and mobility: Secondary | ICD-10-CM | POA: Diagnosis not present

## 2017-11-14 DIAGNOSIS — E039 Hypothyroidism, unspecified: Secondary | ICD-10-CM | POA: Diagnosis not present

## 2017-11-15 NOTE — Progress Notes (Deleted)
Subjective:   Darlene Wilson is a 84 y.o. female who presents for Medicare Annual (Subsequent) preventive examination.  Review of Systems: No ROS.  Medicare Wellness Visit. Additional risk factors are reflected in the social history.   Sleep patterns:   Home Safety/Smoke Alarms: Feels safe in home. Smoke alarms in place.  Living environment; residence and Solicitor: Lives at [facility] in private room. Wears life alert pendant.   Female:        Mammo- No longer doing routine screening due to age.      Dexa scan-  No longer doing routine screening due to age.     Objective:     Vitals: There were no vitals taken for this visit.  There is no height or weight on file to calculate BMI.  Advanced Directives 07/26/2017 11/17/2016 06/23/2015 03/08/2015 10/21/2014 04/07/2014 11/20/2012  Does Patient Have a Medical Advance Directive? Yes Yes Yes Yes Yes Yes Patient has advance directive, copy not in chart  Type of Advance Directive Healthcare Power of [employer] of [place];Living will;Out of facility DNR (pink MOST or yellow form) Healthcare Power of [place];Living will;Out of facility DNR (pink MOST or yellow form) Living will;Healthcare Power of Attorney Living will - Living will  Does patient want to make changes to medical advance directive? - - No - Patient declined No - Patient declined - No - Patient declined -  Copy of Healthcare Power of Attorney in Chart? - No - copy requested Yes No - copy requested - No - copy requested -  Pre-existing out of facility DNR order (yellow form or pink MOST form) - - Yellow form placed in chart (order not valid for inpatient use);Pink MOST form placed in chart (order not valid for inpatient use) - - - -    Tobacco Social History   Tobacco Use  Smoking Status Never Smoker  Smokeless Tobacco Never Used     Counseling given: Not Answered   Clinical Intake:                       Past Medical History:    Diagnosis Date  . Chronic kidney disease 1998   stones  . Depression with anxiety 09/14/2014  . Fibrocystic breast 01/12/2011  . Glaucoma    both eyes  . History of chicken pox   . Hypertension   . Insomnia 01/13/2011  . Kidney stone    stones   . Memory loss 10/12/2012  . Nausea 05/31/2012  . Spinal stenosis 06/14/2011  . Stroke (HCC) 04/2007   x 3 total  . Thyroid disease    hypothyroid  . Urinary frequency 02/09/2011  . Vertigo 02/09/2011   Past Surgical History:  Procedure Laterality Date  . ABDOMINAL HYSTERECTOMY  1963  . BACK SURGERY  1989   discectomy L4-5 1989, good results  . BREAST SURGERY  1970   biopsy, benign, b/l   Family History  Problem Relation Age of Onset  . Heart disease Mother   . Hypertension Mother   . Diabetes Mother   . Heart disease Father   . Hypertension Father   . Cancer Sister        breast cancer  . Mental illness Brother        service connected   Social History   Socioeconomic History  . Marital status: Widowed    Spouse name: Not on file  . Number of children: 2  . Years of education: HS  .  Highest education level: Not on file  Occupational History  . Occupation: Retired  Engineer, production  . Financial resource strain: Not on file  . Food insecurity:    Worry: Not on file    Inability: Not on file  . Transportation needs:    Medical: Not on file    Non-medical: Not on file  Tobacco Use  . Smoking status: Never Smoker  . Smokeless tobacco: Never Used  Substance and Sexual Activity  . Alcohol use: No  . Drug use: No  . Sexual activity: Not Currently    Comment: lives alone, no dietary restrictions  Lifestyle  . Physical activity:    Days per week: Not on file    Minutes per session: Not on file  . Stress: Not on file  Relationships  . Social connections:    Talks on phone: Not on file    Gets together: Not on file    Attends religious service: Not on file    Active member of club or organization: Not on file    Attends  meetings of clubs or organizations: Not on file    Relationship status: Not on file  Other Topics Concern  . Not on file  Social History Narrative   Patient lives at home alone.   Caffeine Use: 1-2 cups  daily    Outpatient Encounter Medications as of 11/20/2017  Medication Sig  . acetaminophen (TYLENOL) 325 MG tablet Take 2 tablets (650 mg total) by mouth every 6 (six) hours as needed.  Marland Kitchen amLODipine (NORVASC) 5 MG tablet Take 1 tablet (5 mg total) by mouth daily.  . citalopram (CELEXA) 10 MG tablet Take 1 tablet (10 mg total) by mouth daily.  Marland Kitchen dipyridamole-aspirin (AGGRENOX) 200-25 MG per 12 hr capsule TAKE 1 CAPSULE BY MOUTH 2 (TWO) TIMES DAILY.  Marland Kitchen donepezil (ARICEPT) 10 MG tablet Take 1 tablet (10 mg total) by mouth at bedtime.  Marland Kitchen doxycycline (VIBRAMYCIN) 100 MG capsule Take 1 capsule (100 mg total) by mouth 2 (two) times daily.  Marland Kitchen gabapentin (NEURONTIN) 100 MG capsule TAKE 1 CAPSULE (100 MG TOTAL) BY MOUTH AT BEDTIME.  Marland Kitchen latanoprost (XALATAN) 0.005 % ophthalmic solution Place 1 drop into both eyes at bedtime.  Marland Kitchen levothyroxine (SYNTHROID, LEVOTHROID) 75 MCG tablet Take 1 tablet (75 mcg total) by mouth daily.  Marland Kitchen lisinopril (PRINIVIL,ZESTRIL) 10 MG tablet 1 tab po bid (Patient taking differently: Take 10 mg by mouth daily. )  . loperamide (IMODIUM A-D) 2 MG tablet Take 2 mg by mouth 4 (four) times daily as needed for diarrhea or loose stools.  . ondansetron (ZOFRAN) 4 MG tablet Take 4 mg by mouth every 8 (eight) hours as needed for nausea or vomiting.  . timolol (BETIMOL) 0.5 % ophthalmic solution Place 1 drop into both eyes every morning.     No facility-administered encounter medications on file as of 11/20/2017.     Activities of Daily Living In your present state of health, do you have any difficulty performing the following activities: 11/17/2016  Hearing? N  Vision? N  Difficulty concentrating or making decisions? Y  Walking or climbing stairs? Y  Dressing or bathing? N  Doing  errands, shopping? Y  Preparing Food and eating ? Y  Using the Toilet? Y  In the past six months, have you accidently leaked urine? N  Do you have problems with loss of bowel control? N  Managing your Medications? Y  Managing your Finances? Y  Housekeeping or managing your Housekeeping? Jeannie Fend  Some recent data might be hidden    Patient Care Team: Bradd CanaryBlyth, Stacey A, MD as PCP - General (Family Medicine) Nelson Chimesigby, Donald, MD as Consulting Physician (Ophthalmology) Micki RileySethi, Pramod S, MD as Consulting Physician (Neurology)    Assessment:   This is a routine wellness examination for Laelani.  Exercise Activities and Dietary recommendations    Goals    None      Fall Risk Fall Risk  08/21/2017 11/17/2016 08/10/2016 08/12/2015 06/23/2015  Falls in the past year? No No No No No  Risk for fall due to : - Impaired balance/gait - - -   Is the patient's home free of loose throw rugs in walkways, pet beds, electrical cords, etc?   {Blank single:19197::"yes","no"}      Grab bars in the bathroom? {Blank single:19197::"yes","no"}      Handrails on the stairs?   {Blank single:19197::"yes","no"}      Adequate lighting?   {Blank single:19197::"yes","no"}  Timed Get Up and Go performed: ***  Depression Screen PHQ 2/9 Scores 11/17/2016 06/23/2015 04/07/2014  PHQ - 2 Score 0 0 0     Cognitive Function MMSE - Mini Mental State Exam 08/21/2017 11/17/2016  Orientation to time 1 0  Orientation to Place 2 2  Registration 3 3  Attention/ Calculation 0 1  Recall 0 1  Language- name 2 objects 2 2  Language- repeat 0 1  Language- follow 3 step command 2 2  Language- read & follow direction 1 1  Write a sentence 1 1  Copy design 0 0  Total score 12 14        Immunization History  Administered Date(s) Administered  . Influenza Split 02/09/2011, 02/28/2012  . Influenza,inj,Quad PF,6+ Mos 02/06/2013, 04/07/2014  . PPD Test 11/14/2014  . Pneumococcal Conjugate-13 04/07/2014  . Pneumococcal  Polysaccharide-23 07/28/2008  . Tdap 04/30/2007    Qualifies for Shingles Vaccine?***  Screening Tests Health Maintenance  Topic Date Due  . DEXA SCAN  01/15/1994  . TETANUS/TDAP  04/29/2017  . INFLUENZA VACCINE  12/28/2017  . PNA vac Low Risk Adult  Completed    Cancer Screenings: Lung: Low Dose CT Chest recommended if Age 71-80 years, 30 pack-year currently smoking OR have quit w/in 15years. Patient {DOES NOT does:27190::"does not"} qualify. Breast:  Up to date on Mammogram? {Yes/No:30480221}   Up to date of Bone Density/Dexa? {Yes/No:30480221} Colorectal: ***  Additional Screenings: ***: Hepatitis C Screening:      Plan:   ***   I have personally reviewed and noted the following in the patient's chart:   . Medical and social history . Use of alcohol, tobacco or illicit drugs  . Current medications and supplements . Functional ability and status . Nutritional status . Physical activity . Advanced directives . List of other physicians . Hospitalizations, surgeries, and ER visits in previous 12 months . Vitals . Screenings to include cognitive, depression, and falls . Referrals and appointments  In addition, I have reviewed and discussed with patient certain preventive protocols, quality metrics, and best practice recommendations. A written personalized care plan for preventive services as well as general preventive health recommendations were provided to patient.     Avon GullyBritt, Davy Faught Angel, [place]RN  11/15/2017

## 2017-11-16 ENCOUNTER — Ambulatory Visit: Payer: Medicare Other | Admitting: Family Medicine

## 2017-11-20 ENCOUNTER — Ambulatory Visit: Payer: Medicare Other | Admitting: *Deleted

## 2017-11-29 DIAGNOSIS — I10 Essential (primary) hypertension: Secondary | ICD-10-CM | POA: Diagnosis not present

## 2017-11-29 DIAGNOSIS — F0390 Unspecified dementia without behavioral disturbance: Secondary | ICD-10-CM | POA: Diagnosis not present

## 2017-12-11 ENCOUNTER — Other Ambulatory Visit: Payer: Self-pay

## 2017-12-11 MED ORDER — TIMOLOL HEMIHYDRATE 0.5 % OP SOLN: 1.0000 [drp] | OPHTHALMIC | 11 refills | Status: DC

## 2017-12-12 DIAGNOSIS — Z79899 Other long term (current) drug therapy: Secondary | ICD-10-CM | POA: Diagnosis not present

## 2017-12-15 ENCOUNTER — Other Ambulatory Visit: Payer: Self-pay

## 2017-12-15 ENCOUNTER — Telehealth: Payer: Self-pay | Admitting: *Deleted

## 2017-12-15 MED ORDER — BETAXOLOL HCL 0.5 % OP SOLN: 1.0000 [drp] | Freq: Two times a day (BID) | OPHTHALMIC | 12 refills | Status: DC

## 2017-12-15 NOTE — Telephone Encounter (Signed)
Received notice of non-covered medication, Betimol 0.5% drops is not covered; Betaxolol HCL Sterile 0.5% drops is on the formulary list; forwarded to provider's MA/SLS 07/19

## 2017-12-22 NOTE — Telephone Encounter (Signed)
[facility] duplication [facility] the Betimol and Betaxolol drops. Wrote note on Betaxolol Rx from pharmacy and explained  [See Phone Note from 07/19] that Betimol was to be D/C due to non-coverage from Insurance and was to be changed to the covered formulary Betaxolol/SLS 07/26

## 2018-01-12 ENCOUNTER — Emergency Department (HOSPITAL_COMMUNITY): Payer: Medicare Other

## 2018-01-12 ENCOUNTER — Encounter (HOSPITAL_COMMUNITY): Payer: Self-pay | Admitting: Emergency Medicine

## 2018-01-12 ENCOUNTER — Other Ambulatory Visit: Payer: Self-pay

## 2018-01-12 ENCOUNTER — Inpatient Hospital Stay (HOSPITAL_COMMUNITY)
Admission: EM | Admit: 2018-01-12 | Discharge: 2018-01-15 | DRG: 194 | Disposition: A | Discharge status: Home / Self Care | Payer: Medicare Other | Source: Skilled Nursing Facility | Attending: Family Medicine | Admitting: Family Medicine

## 2018-01-12 DIAGNOSIS — Z66 Do not resuscitate: Secondary | ICD-10-CM | POA: Diagnosis not present

## 2018-01-12 DIAGNOSIS — Z882 Allergy status to sulfonamides status: Secondary | ICD-10-CM

## 2018-01-12 DIAGNOSIS — J189 Pneumonia, unspecified organism: Secondary | ICD-10-CM | POA: Diagnosis not present

## 2018-01-12 DIAGNOSIS — I639 Cerebral infarction, unspecified: Secondary | ICD-10-CM | POA: Diagnosis present

## 2018-01-12 DIAGNOSIS — Z881 Allergy status to other antibiotic agents status: Secondary | ICD-10-CM

## 2018-01-12 DIAGNOSIS — N3000 Acute cystitis without hematuria: Secondary | ICD-10-CM | POA: Diagnosis not present

## 2018-01-12 DIAGNOSIS — R062 Wheezing: Secondary | ICD-10-CM | POA: Diagnosis not present

## 2018-01-12 DIAGNOSIS — Z79890 Hormone replacement therapy (postmenopausal): Secondary | ICD-10-CM

## 2018-01-12 DIAGNOSIS — R413 Other amnesia: Secondary | ICD-10-CM | POA: Diagnosis present

## 2018-01-12 DIAGNOSIS — Z8249 Family history of ischemic heart disease and other diseases of the circulatory system: Secondary | ICD-10-CM

## 2018-01-12 DIAGNOSIS — I10 Essential (primary) hypertension: Secondary | ICD-10-CM | POA: Diagnosis not present

## 2018-01-12 DIAGNOSIS — F0280 Dementia in other diseases classified elsewhere without behavioral disturbance: Secondary | ICD-10-CM | POA: Diagnosis present

## 2018-01-12 DIAGNOSIS — Z90710 Acquired absence of both cervix and uterus: Secondary | ICD-10-CM

## 2018-01-12 DIAGNOSIS — R0602 Shortness of breath: Secondary | ICD-10-CM

## 2018-01-12 DIAGNOSIS — H9190 Unspecified hearing loss, unspecified ear: Secondary | ICD-10-CM | POA: Diagnosis present

## 2018-01-12 DIAGNOSIS — F418 Other specified anxiety disorders: Secondary | ICD-10-CM | POA: Diagnosis not present

## 2018-01-12 DIAGNOSIS — F0281 Dementia in other diseases classified elsewhere with behavioral disturbance: Secondary | ICD-10-CM | POA: Diagnosis present

## 2018-01-12 DIAGNOSIS — Z87442 Personal history of urinary calculi: Secondary | ICD-10-CM

## 2018-01-12 DIAGNOSIS — Z803 Family history of malignant neoplasm of breast: Secondary | ICD-10-CM

## 2018-01-12 DIAGNOSIS — G309 Alzheimer's disease, unspecified: Secondary | ICD-10-CM | POA: Diagnosis present

## 2018-01-12 DIAGNOSIS — R079 Chest pain, unspecified: Secondary | ICD-10-CM | POA: Diagnosis not present

## 2018-01-12 DIAGNOSIS — E039 Hypothyroidism, unspecified: Secondary | ICD-10-CM | POA: Diagnosis present

## 2018-01-12 DIAGNOSIS — I69351 Hemiplegia and hemiparesis following cerebral infarction affecting right dominant side: Secondary | ICD-10-CM

## 2018-01-12 DIAGNOSIS — R0789 Other chest pain: Secondary | ICD-10-CM | POA: Diagnosis not present

## 2018-01-12 DIAGNOSIS — E079 Disorder of thyroid, unspecified: Secondary | ICD-10-CM | POA: Diagnosis present

## 2018-01-12 DIAGNOSIS — H409 Unspecified glaucoma: Secondary | ICD-10-CM | POA: Diagnosis present

## 2018-01-12 DIAGNOSIS — Z885 Allergy status to narcotic agent status: Secondary | ICD-10-CM

## 2018-01-12 DIAGNOSIS — Z79899 Other long term (current) drug therapy: Secondary | ICD-10-CM

## 2018-01-12 DIAGNOSIS — F0390 Unspecified dementia without behavioral disturbance: Secondary | ICD-10-CM | POA: Diagnosis not present

## 2018-01-12 DIAGNOSIS — I69959 Hemiplegia and hemiparesis following unspecified cerebrovascular disease affecting unspecified side: Secondary | ICD-10-CM

## 2018-01-12 DIAGNOSIS — Z880 Allergy status to penicillin: Secondary | ICD-10-CM

## 2018-01-12 DIAGNOSIS — G8929 Other chronic pain: Secondary | ICD-10-CM | POA: Diagnosis present

## 2018-01-12 DIAGNOSIS — N6019 Diffuse cystic mastopathy of unspecified breast: Secondary | ICD-10-CM | POA: Diagnosis present

## 2018-01-12 DIAGNOSIS — Y95 Nosocomial condition: Secondary | ICD-10-CM | POA: Diagnosis present

## 2018-01-12 HISTORY — DX: Dyspnea, unspecified: R06.00

## 2018-01-12 LAB — CBC
HCT: 42.8 % (ref 36.0–46.0)
Hemoglobin: 13 g/dL (ref 12.0–15.0)
MCH: 31.4 pg (ref 26.0–34.0)
MCHC: 30.4 g/dL (ref 30.0–36.0)
MCV: 103.4 fL — ABNORMAL HIGH (ref 78.0–100.0)
PLATELETS: 174 10*3/uL (ref 150–400)
RBC: 4.14 MIL/uL (ref 3.87–5.11)
RDW: 14.6 % (ref 11.5–15.5)
WBC: 8.6 10*3/uL (ref 4.0–10.5)

## 2018-01-12 LAB — I-STAT CHEM 8, ED
BUN: 22 mg/dL (ref 8–23)
CALCIUM ION: 1.05 mmol/L — AB (ref 1.15–1.40)
Chloride: 104 mmol/L (ref 98–111)
Creatinine, Ser: 0.7 mg/dL (ref 0.44–1.00)
GLUCOSE: 107 mg/dL — AB (ref 70–99)
HCT: 42 % (ref 36.0–46.0)
HEMOGLOBIN: 14.3 g/dL (ref 12.0–15.0)
POTASSIUM: 4.3 mmol/L (ref 3.5–5.1)
SODIUM: 141 mmol/L (ref 135–145)
TCO2: 28 mmol/L (ref 22–32)

## 2018-01-12 LAB — URINALYSIS, ROUTINE W REFLEX MICROSCOPIC
Bilirubin Urine: NEGATIVE
Glucose, UA: NEGATIVE mg/dL
HGB URINE DIPSTICK: NEGATIVE
Ketones, ur: NEGATIVE mg/dL
Nitrite: NEGATIVE
PROTEIN: NEGATIVE mg/dL
Specific Gravity, Urine: 1.021 (ref 1.005–1.030)
pH: 7 (ref 5.0–8.0)

## 2018-01-12 LAB — BASIC METABOLIC PANEL
ANION GAP: 8 (ref 5–15)
BUN: 15 mg/dL (ref 8–23)
CALCIUM: 8.5 mg/dL — AB (ref 8.9–10.3)
CO2: 27 mmol/L (ref 22–32)
CREATININE: 0.77 mg/dL (ref 0.44–1.00)
Chloride: 106 mmol/L (ref 98–111)
GFR calc Af Amer: >60 mL/min (ref >60)
GLUCOSE: 113 mg/dL — AB (ref 70–99)
Potassium: 3.6 mmol/L (ref 3.5–5.1)
Sodium: 141 mmol/L (ref 135–145)

## 2018-01-12 LAB — HEPATIC FUNCTION PANEL
ALK PHOS: 157 U/L — AB (ref 38–126)
ALT: 16 U/L (ref 0–44)
AST: 24 U/L (ref 15–41)
Albumin: 3.3 g/dL — ABNORMAL LOW (ref 3.5–5.0)
BILIRUBIN DIRECT: 0.2 mg/dL (ref 0.0–0.2)
BILIRUBIN INDIRECT: 0.5 mg/dL (ref 0.3–0.9)
TOTAL PROTEIN: 6.1 g/dL — AB (ref 6.5–8.1)
Total Bilirubin: 0.7 mg/dL (ref 0.3–1.2)

## 2018-01-12 LAB — PROCALCITONIN: Procalcitonin: <0.1 ng/mL

## 2018-01-12 LAB — I-STAT TROPONIN, ED
TROPONIN I, POC: 0 ng/mL (ref 0.00–0.08)
TROPONIN I, POC: 0 ng/mL (ref 0.00–0.08)

## 2018-01-12 LAB — BRAIN NATRIURETIC PEPTIDE: B NATRIURETIC PEPTIDE 5: 41.5 pg/mL (ref 0.0–100.0)

## 2018-01-12 LAB — I-STAT CG4 LACTIC ACID, ED
LACTIC ACID, VENOUS: 2.75 mmol/L — AB (ref 0.5–1.9)
Lactic Acid, Venous: 2.07 mmol/L (ref 0.5–1.9)

## 2018-01-12 MED ORDER — VANCOMYCIN HCL IN DEXTROSE 1-5 GM/200ML-% IV SOLN
1000.0000 mg | Freq: Once | INTRAVENOUS | Status: AC
  Administered 2018-01-12: 1000 mg via INTRAVENOUS
  Filled 2018-01-12: qty 200

## 2018-01-12 MED ORDER — TIMOLOL MALEATE 0.5 % OP SOLG
1.0000 [drp] | Freq: Every day | OPHTHALMIC | Status: DC
  Administered 2018-01-13 – 2018-01-14 (×2): 1 [drp] via OPHTHALMIC
  Filled 2018-01-12: qty 5

## 2018-01-12 MED ORDER — SODIUM CHLORIDE 0.9% FLUSH: 3.0000 mL | INTRAVENOUS | Status: DC | PRN

## 2018-01-12 MED ORDER — ASPIRIN-DIPYRIDAMOLE ER 25-200 MG PO CP12
1.0000 | ORAL_CAPSULE | Freq: Two times a day (BID) | ORAL | Status: DC
  Administered 2018-01-12 – 2018-01-15 (×6): 1 via ORAL
  Filled 2018-01-12 (×6): qty 1

## 2018-01-12 MED ORDER — AMLODIPINE BESYLATE 5 MG PO TABS
5.0000 mg | ORAL_TABLET | Freq: Every day | ORAL | Status: DC
  Administered 2018-01-12 – 2018-01-15 (×4): 5 mg via ORAL
  Filled 2018-01-12 (×4): qty 1

## 2018-01-12 MED ORDER — MENTHOL (TOPICAL ANALGESIC) 4 % EX GEL: 1.0000 "application " | Freq: Two times a day (BID) | CUTANEOUS | Status: DC | PRN

## 2018-01-12 MED ORDER — SODIUM CHLORIDE 0.9 % IV SOLN
INTRAVENOUS | Status: DC
  Administered 2018-01-12: 12:00:00 via INTRAVENOUS

## 2018-01-12 MED ORDER — ONDANSETRON HCL 4 MG/2ML IJ SOLN: 4.0000 mg | Freq: Four times a day (QID) | INTRAMUSCULAR | Status: DC | PRN

## 2018-01-12 MED ORDER — ALBUTEROL SULFATE (2.5 MG/3ML) 0.083% IN NEBU
5.0000 mg | INHALATION_SOLUTION | Freq: Once | RESPIRATORY_TRACT | Status: AC
  Administered 2018-01-12: 5 mg via RESPIRATORY_TRACT
  Filled 2018-01-12: qty 6

## 2018-01-12 MED ORDER — LATANOPROST 0.005 % OP SOLN
1.0000 [drp] | Freq: Every day | OPHTHALMIC | Status: DC
  Administered 2018-01-12: 1 [drp] via OPHTHALMIC
  Filled 2018-01-12 (×2): qty 2.5

## 2018-01-12 MED ORDER — ACETAMINOPHEN 325 MG PO TABS: 650.0000 mg | ORAL_TABLET | Freq: Four times a day (QID) | ORAL | Status: DC | PRN

## 2018-01-12 MED ORDER — SODIUM CHLORIDE 0.9% FLUSH
3.0000 mL | Freq: Two times a day (BID) | INTRAVENOUS | Status: DC
  Administered 2018-01-12 – 2018-01-15 (×4): 3 mL via INTRAVENOUS

## 2018-01-12 MED ORDER — SODIUM CHLORIDE 0.9 % IV SOLN
2.0000 g | Freq: Once | INTRAVENOUS | Status: AC
  Administered 2018-01-12: 2 g via INTRAVENOUS
  Filled 2018-01-12: qty 2

## 2018-01-12 MED ORDER — LEVOTHYROXINE SODIUM 75 MCG PO TABS
37.5000 ug | ORAL_TABLET | Freq: Every day | ORAL | Status: DC
  Administered 2018-01-14 – 2018-01-15 (×2): 37.5 ug via ORAL
  Filled 2018-01-12 (×3): qty 1

## 2018-01-12 MED ORDER — VANCOMYCIN HCL 500 MG IV SOLR
500.0000 mg | Freq: Two times a day (BID) | INTRAVENOUS | Status: DC
  Administered 2018-01-13: 500 mg via INTRAVENOUS
  Filled 2018-01-12 (×2): qty 500

## 2018-01-12 MED ORDER — IOPAMIDOL (ISOVUE-370) INJECTION 76%
INTRAVENOUS | Status: AC
  Filled 2018-01-12: qty 100

## 2018-01-12 MED ORDER — IPRATROPIUM-ALBUTEROL 0.5-2.5 (3) MG/3ML IN SOLN
3.0000 mL | Freq: Four times a day (QID) | RESPIRATORY_TRACT | Status: DC
  Filled 2018-01-12: qty 3

## 2018-01-12 MED ORDER — SODIUM CHLORIDE 0.9 % IV SOLN: 250.0000 mL | INTRAVENOUS | Status: DC | PRN

## 2018-01-12 MED ORDER — ENOXAPARIN SODIUM 40 MG/0.4ML ~~LOC~~ SOLN
40.0000 mg | SUBCUTANEOUS | Status: DC
  Administered 2018-01-12: 40 mg via SUBCUTANEOUS
  Filled 2018-01-12 (×2): qty 0.4

## 2018-01-12 MED ORDER — SODIUM CHLORIDE 0.9 % IV SOLN: 2.0000 g | Freq: Once | INTRAVENOUS | Status: DC

## 2018-01-12 MED ORDER — LOPERAMIDE HCL 2 MG PO CAPS: 2.0000 mg | ORAL_CAPSULE | Freq: Four times a day (QID) | ORAL | Status: DC | PRN

## 2018-01-12 MED ORDER — ONDANSETRON HCL 4 MG PO TABS: 4.0000 mg | ORAL_TABLET | Freq: Four times a day (QID) | ORAL | Status: DC | PRN

## 2018-01-12 MED ORDER — CITALOPRAM HYDROBROMIDE 10 MG PO TABS
5.0000 mg | ORAL_TABLET | Freq: Every day | ORAL | Status: DC
  Administered 2018-01-13 – 2018-01-15 (×3): 5 mg via ORAL
  Filled 2018-01-12 (×4): qty 1

## 2018-01-12 MED ORDER — IPRATROPIUM-ALBUTEROL 0.5-2.5 (3) MG/3ML IN SOLN: 3.0000 mL | Freq: Four times a day (QID) | RESPIRATORY_TRACT | Status: DC | PRN

## 2018-01-12 MED ORDER — POLYETHYLENE GLYCOL 3350 17 G PO PACK
17.0000 g | PACK | Freq: Every day | ORAL | Status: DC | PRN
Start: 2018-01-12 — End: 2018-01-15

## 2018-01-12 MED ORDER — DONEPEZIL HCL 10 MG PO TABS
10.0000 mg | ORAL_TABLET | Freq: Every day | ORAL | Status: DC
  Administered 2018-01-12 – 2018-01-14 (×3): 10 mg via ORAL
  Filled 2018-01-12 (×3): qty 1

## 2018-01-12 MED ORDER — GABAPENTIN 100 MG PO CAPS
100.0000 mg | ORAL_CAPSULE | Freq: Every day | ORAL | Status: DC
  Administered 2018-01-12 – 2018-01-14 (×3): 100 mg via ORAL
  Filled 2018-01-12 (×3): qty 1

## 2018-01-12 MED ORDER — SODIUM CHLORIDE 0.9 % IV BOLUS
500.0000 mL | Freq: Once | INTRAVENOUS | Status: AC
  Administered 2018-01-12: 500 mL via INTRAVENOUS

## 2018-01-12 MED ORDER — IOPAMIDOL (ISOVUE-370) INJECTION 76%
100.0000 mL | Freq: Once | INTRAVENOUS | Status: AC | PRN
  Administered 2018-01-12: 100 mL via INTRAVENOUS

## 2018-01-12 MED ORDER — ACETAMINOPHEN 650 MG RE SUPP: 650.0000 mg | Freq: Four times a day (QID) | RECTAL | Status: DC | PRN

## 2018-01-12 MED ORDER — BETAXOLOL HCL 0.5 % OP SOLN: 1.0000 [drp] | Freq: Two times a day (BID) | OPHTHALMIC | Status: DC

## 2018-01-12 MED ORDER — CEFEPIME HCL 2 G IJ SOLR
2.0000 g | INTRAMUSCULAR | Status: DC
  Administered 2018-01-13: 2 g via INTRAVENOUS
  Filled 2018-01-12: qty 2

## 2018-01-12 MED ORDER — MUSCLE RUB 10-15 % EX CREA
TOPICAL_CREAM | Freq: Two times a day (BID) | CUTANEOUS | Status: DC | PRN
  Filled 2018-01-12: qty 85

## 2018-01-12 NOTE — ED Notes (Signed)
Admitting at the beside

## 2018-01-12 NOTE — Progress Notes (Signed)
Pharmacy Antibiotic Note  Darlene SchmidtJanie B Wilson is a 84 y.o. female admitted on 01/12/2018 with pneumonia.  Pharmacy has been consulted for cefepime and vancomycin dosing. Patient with difficulty breathing. WBC 8.6, LA 2.7  Plan: Vancomycin 1000mg  IV once in the ED then 500mg  IV every 12 hours.  Goal trough 15-20 mcg/mL.  Cefepime 2G IV QD Monitor clinical progression and order levels as appropriate  Height: 5\' 5"  (165.1 cm) Weight: 140 lb (63.5 kg) IBW/kg (Calculated) : 57  Temp (24hrs), Avg:97.6 F (36.4 C), Min:97.6 F (36.4 C), Max:97.6 F (36.4 C)  Recent Labs  Lab 01/12/18 0842 01/12/18 0856 01/12/18 1302  WBC 8.6  --   --   CREATININE 0.77 0.70  --   LATICACIDVEN  --  2.07* 2.75*    Estimated Creatinine Clearance: 43.7 mL/min (by C-G formula based on SCr of 0.7 mg/dL).    Allergies  Allergen Reactions  . Ciprofloxacin Hives    Given with 2 other meds/unsure if truly allergic  . Combigan [Brimonidine Tartrate-Timolol] Other (See Comments)    Inflamation  . Detrol [Tolterodine Tartrate] Hives    Given with 2 other medications/unsure if truly allergic  . Oxycodone Hives    Given with 2 other medications/unsure if truly allergic  . Penicillins Hives, Itching and Rash  . Sulfa Antibiotics Hives, Itching and Rash    Antimicrobials this admission: Vancomycin 8/16>> Cefepime 8/16>>  Thank you for allowing pharmacy to be a part of this patient's care.  Ruben Imony Tyesha Joffe, PharmD Clinical Pharmacist 01/12/2018 2:58 PM Please check AM[facility] for all [facility] numbers

## 2018-01-12 NOTE — ED Provider Notes (Signed)
[facility] Provider Note   CSN: [id number] Arrival date & time: 01/12/18  [id number]     History   Chief Complaint Chief Complaint  Patient presents with  . Chest Pain  . Shortness of Breath    HPI Darlene Wilson is a 84 y.o. female.  Patient brought in by EMS from [facility].  Patient woke up this morning with chest pressure and shortness of breath.  Upon EMS arrival lungs were clear.  Patient developed wheezing in route.  She was given 5 mg of albuterol breathing treatment.  Wheezing and chest pressure improved.  Patient was also given 324 mg of aspirin as well.  Patient has a history of Alzheimer's facility stated the patient's mental status was baseline.  Patient is actually in in more of an assisted living type environment.  Upon arrival patient is stated that she could not breathe and she did not understand what was wrong.  She was tachypneic.  Patient is a DNR.     Past Medical History:  Diagnosis Date  . Chronic kidney disease 1998   stones  . Depression with anxiety 09/14/2014  . Fibrocystic breast 01/12/2011  . Glaucoma    both eyes  . History of chicken pox   . Hypertension   . Insomnia 01/13/2011  . Kidney stone    stones   . Memory loss 10/12/2012  . Nausea 05/31/2012  . Spinal stenosis 06/14/2011  . Stroke (HCC) 04/2007   x 3 total  . Thyroid disease    hypothyroid  . Urinary frequency 02/09/2011  . Vertigo 02/09/2011    Patient Active Problem List   Diagnosis Date Noted  . Alzheimer disease 08/10/2016  . Malnutrition of moderate degree 03/10/2015  . Syncope 03/08/2015  . Glaucoma 03/08/2015  . Hearing loss 09/14/2014  . Depression with anxiety 09/14/2014  . Medicare annual wellness visit, subsequent 04/13/2014  . Hyperglycemia 10/06/2013  . Neck pain 11/20/2012  . Dizziness and giddiness 11/08/2012  . Degeneration of cervical intervertebral disc 11/08/2012  . Cervical spondylosis without myelopathy  11/08/2012  . Other malaise and fatigue 11/08/2012  . Disturbance of skin sensation 11/08/2012  . Hemiplegia of dominant side as late effect following cerebrovascular disease (HCC) 11/08/2012  . Other late effects of cerebrovascular disease(438.89) 11/08/2012  . Memory loss 10/12/2012  . Spinal stenosis 06/14/2011  . Urinary frequency 02/09/2011  . Vertigo 02/09/2011  . Insomnia 01/13/2011  . Fibrocystic breast 01/12/2011  . Hypertension   . Kidney stone   . Thyroid disease   . Stroke [facility](HCC) 04/30/2007    Past Surgical History:  Procedure Laterality Date  . ABDOMINAL HYSTERECTOMY  1963  . BACK SURGERY  1989   discectomy L4-5 1989, good results  . BREAST SURGERY  1970   biopsy, benign, b/l     OB History   None      Home Medications    Prior to Admission medications   Medication Sig Start Date End Date Taking? Authorizing Provider  acetaminophen (TYLENOL) 325 MG tablet Take 2 tablets (650 mg total) by mouth every 6 (six) hours as needed. Patient taking differently: Take 650 mg by mouth daily.  11/21/12  Yes Christiane HaSullivan, Corinna L, MD  amLODipine (NORVASC) 5 MG tablet Take 1 tablet (5 mg total) by mouth daily. 03/30/15  Yes Bradd CanaryBlyth, Stacey A, MD  betaxolol (BETOPTIC-S) 0.5 % ophthalmic suspension Place 1 drop into both eyes 2 (two) times daily. 12/15/17  Yes Bradd CanaryBlyth, Stacey A, MD  citalopram (CELEXA) 10 MG tablet Take 1 tablet (10 mg total) by mouth daily. Patient taking differently: Take 5 mg by mouth daily.  04/02/15  Yes Bradd CanaryBlyth, Stacey A, MD  dipyridamole-aspirin (AGGRENOX) 200-25 MG per 12 hr capsule TAKE 1 CAPSULE BY MOUTH 2 (TWO) TIMES DAILY. Patient taking differently: Take 1 capsule by mouth 2 (two) times daily.  09/30/14  Yes Bradd CanaryBlyth, Stacey A, MD  donepezil (ARICEPT) 10 MG tablet Take 1 tablet (10 mg total) by mouth at bedtime. 09/08/14  Yes Bradd CanaryBlyth, Stacey A, MD  gabapentin (NEURONTIN) 100 MG capsule TAKE 1 CAPSULE (100 MG TOTAL) BY MOUTH AT BEDTIME. Patient taking differently:  Take 100 mg by mouth at bedtime.  04/10/15  Yes Bradd CanaryBlyth, Stacey A, MD  latanoprost (XALATAN) 0.005 % ophthalmic solution Place 1 drop into both eyes at bedtime. 05/02/14  Yes Bradd CanaryBlyth, Stacey A, MD  levothyroxine (SYNTHROID, LEVOTHROID) 75 MCG tablet Take 1 tablet (75 mcg total) by mouth daily. Patient taking differently: Take 37.5 mcg by mouth daily.  06/25/14  Yes Bradd CanaryBlyth, Stacey A, MD  loperamide (IMODIUM A-D) 2 MG tablet Take 2 mg by mouth every 6 (six) hours as needed for diarrhea or loose stools.    Yes [provider]  Menthol, Topical Analgesic, (BIOFREEZE) 4 % GEL Apply 1 application topically every 12 (twelve) hours as needed (for right shoulder pain).   Yes [provider]  ondansetron (ZOFRAN) 4 MG tablet Take 4 mg by mouth every 8 (eight) hours as needed for nausea or vomiting.    Yes [provider]  timolol (BETIMOL) 0.5 % ophthalmic solution Place 1 drop into both eyes every morning.   Yes [provider]  lisinopril (PRINIVIL,ZESTRIL) 10 MG tablet 1 tab po bid Patient not taking: Reported on 01/12/2018 11/14/14   Saguier, Ramon DredgeEdward, PA-C    Family History Family History  Problem Relation Age of Onset  . Heart disease Mother   . Hypertension Mother   . Diabetes Mother   . Heart disease Father   . Hypertension Father   . Cancer Sister        breast cancer  . Mental illness Brother        service connected    Social History Social History   Tobacco Use  . Smoking status: Never Smoker  . Smokeless tobacco: Never Used  Substance Use Topics  . Alcohol use: No  . Drug use: No     Allergies   Ciprofloxacin; Combigan [brimonidine tartrate-timolol]; Detrol [tolterodine tartrate]; Oxycodone; Penicillins; and Sulfa antibiotics   Review of Systems Review of Systems  Unable to perform ROS: Dementia  Respiratory: Positive for shortness of breath.   Cardiovascular: Positive for chest pain.  Gastrointestinal: Negative for abdominal pain.    Musculoskeletal: Negative for myalgias.  Psychiatric/Behavioral: Negative for confusion.     Physical Exam Updated Vital Signs BP (!) 127/55   Pulse 91   Temp 97.6 F (36.4 C) (Rectal)   Resp (!) 27   Ht 1.651 m (5\' 5" )   Wt 63.5 kg   SpO2 98%   BMI 23.30 kg/m   Physical Exam  Constitutional: She appears well-developed and well-nourished. She appears distressed.  HENT:  Head: Normocephalic and atraumatic.  Mucous membranes dry.  Eyes: Pupils are equal, round, and reactive to light. Conjunctivae and EOM are normal.  Neck: Neck supple.  Cardiovascular: Normal rate and regular rhythm.  Pulmonary/Chest: Breath sounds normal. She is in respiratory distress. She has no wheezes. She has no rales.  Abdominal: Soft. Bowel sounds are normal. There is no tenderness.  Musculoskeletal: Normal range of motion. She exhibits no edema.  Neurological: She is alert. No cranial nerve deficit or sensory deficit. She exhibits normal muscle tone. Coordination normal.  Skin: Skin is warm. No rash noted.  Nursing note and vitals reviewed.    ED Treatments / Results  Labs (all labs ordered are listed, but only abnormal results are displayed) Labs Reviewed  BASIC METABOLIC PANEL - Abnormal; Notable for the following components:      Result Value   Glucose, Bld 113 (*)    Calcium 8.5 (*)    All other components within normal limits  CBC - Abnormal; Notable for the following components:   MCV 103.4 (*)    All other components within normal limits  URINALYSIS, ROUTINE W REFLEX MICROSCOPIC - Abnormal; Notable for the following components:   APPearance CLOUDY (*)    Leukocytes, UA MODERATE (*)    Bacteria, UA RARE (*)    All other components within normal limits  HEPATIC FUNCTION PANEL - Abnormal; Notable for the following components:   Total Protein 6.1 (*)    Albumin 3.3 (*)    Alkaline Phosphatase 157 (*)    All other components within normal limits  I-STAT CHEM 8, ED - Abnormal;  Notable for the following components:   Glucose, Bld 107 (*)    Calcium, Ion 1.05 (*)    All other components within normal limits  I-STAT CG4 LACTIC ACID, ED - Abnormal; Notable for the following components:   Lactic Acid, Venous 2.07 (*)    All other components within normal limits  I-STAT CG4 LACTIC ACID, ED - Abnormal; Notable for the following components:   Lactic Acid, Venous 2.75 (*)    All other components within normal limits  CULTURE, BLOOD (ROUTINE X 2)  CULTURE, BLOOD (ROUTINE X 2)  URINE CULTURE  BRAIN NATRIURETIC PEPTIDE  I-STAT TROPONIN, ED  I-STAT TROPONIN, ED  I-STAT TROPONIN, ED    EKG EKG Interpretation  Date/Time:  Friday January 12 2018 08:36:18 EDT Ventricular Rate:  91 PR Interval:    QRS Duration: 88 QT Interval:  367 QTC Calculation: 452 R Axis:   -12 Text Interpretation:  Sinus rhythm Borderline low voltage, extremity leads No significant change since last tracing Confirmed by Vanetta Mulders [phone]) on 01/12/2018 8:47:25 AM   Radiology Ct Angio Chest Pe W/cm &/or Wo Cm  Result Date: 01/12/2018 CLINICAL DATA:  Chest pressure and shortness of breath since this morning. EXAM: CT ANGIOGRAPHY CHEST WITH CONTRAST TECHNIQUE: Multidetector CT imaging of the chest was performed using the standard protocol during bolus administration of intravenous contrast. Multiplanar CT image reconstructions and MIPs were obtained to evaluate the vascular anatomy. CONTRAST:  ISOVUE-370 IOPAMIDOL (ISOVUE-370) INJECTION 76% COMPARISON:  10/21/2014 FINDINGS: Cardiovascular: Heart is normal size. Mild calcified plaque over the thoracic aorta. Calcified plaque over the left anterior descending coronary artery. Pulmonary arterial system is normal without emboli. Mediastinum/Nodes: No mediastinal or hilar adenopathy. Remaining mediastinal structures are within normal. Lungs/Pleura: Lungs are adequately inflated demonstrate subtle peripheral reticulonodular opacification over the  posterolateral right upper lobe. Minimal atelectasis in the right lower lobe adjacent elevated hemidiaphragm. No evidence of effusion. Airways are within normal. Upper Abdomen: No acute findings. Possible punctate nonobstructing stone over the mid pole left kidney. Calcified plaque over the abdominal aorta. Musculoskeletal: Mild degenerate change of the spine. Review of the MIP images confirms the above findings. IMPRESSION: No evidence of pulmonary embolism.  Subtle focal reticulonodular opacification over the posterolateral right upper lobe which may be due to focal infectious or inflammatory process. Recommend follow-up CT 4-6 weeks. Minimal atherosclerotic coronary artery disease. Aortic Atherosclerosis (ICD10-I70.0). Nonobstructing punctate left renal stone Electronically Signed   By: Elberta Fortis M.D.   On: 01/12/2018 13:25   Dg Chest Port 1 View  Result Date: 01/12/2018 CLINICAL DATA:  Shortness of breath and chest pain. EXAM: PORTABLE CHEST 1 VIEW COMPARISON:  07/26/2017 FINDINGS: Lungs are adequately inflated without airspace consolidation or effusion. Cardiomediastinal silhouette and remainder of the exam is unchanged. IMPRESSION: No active disease. Electronically Signed   By: Elberta Fortis M.D.   On: 01/12/2018 09:02    Procedures Procedures (including critical care time)    CRITICAL CARE Performed by: Vanetta Mulders Total critical care time: 30 minutes Critical care time was exclusive of separately billable procedures and treating other patients. Critical care was necessary to treat or prevent imminent or life-threatening deterioration. Critical care was time spent personally by me on the following activities: development of treatment plan with patient and/or surrogate as well as nursing, discussions with consultants, evaluation of patient's response to treatment, examination of patient, obtaining history from patient or surrogate, ordering and performing treatments and interventions,  ordering and review of laboratory studies, ordering and review of radiographic studies, pulse oximetry and re-evaluation of patient's condition.   Medications Ordered in ED Medications  0.9 %  sodium chloride infusion ( Intravenous New Bag/Given 01/12/18 1214)  iopamidol (ISOVUE-370) 76 % injection (has no administration in time range)  vancomycin (VANCOCIN) IVPB 1000 mg/200 mL premix (has no administration in time range)  vancomycin (VANCOCIN) 500 mg in sodium chloride 0.9 % 100 mL IVPB (has no administration in time range)  ceFEPIme (MAXIPIME) 2 g in sodium chloride 0.9 % 100 mL IVPB (has no administration in time range)  albuterol (PROVENTIL) (2.5 MG/3ML) 0.083% nebulizer solution 5 mg (5 mg Nebulization Given 01/12/18 1037)  sodium chloride 0.9 % bolus 500 mL (0 mLs Intravenous Stopped 01/12/18 1037)  iopamidol (ISOVUE-370) 76 % injection 100 mL (100 mLs Intravenous Contrast Given 01/12/18 1253)  ceFEPIme (MAXIPIME) 2 g in sodium chloride 0.9 % 100 mL IVPB (2 g Intravenous New Bag/Given 01/12/18 1400)     Initial Impression / Assessment and Plan / ED Course  I have reviewed the triage vital signs and the nursing notes.  Pertinent labs & imaging results that were available during my care of the patient were reviewed by me and considered in my medical decision making (see chart for details).     Patient with persistent tachypnea.  Short of breath.  But in route oxygen sats though on oxygen was in the upper 90s but it did not help her shortness of breath.  Chest x-ray negative troponins x2-.  Had CT angios chest no evidence of pulmonary embolus.  However did raise concerns for a right upper lobe focal finding that could be early infection.  Patient certainly seems to have something respiratory going on.  So patient was started on H CAP pneumonia protocol antibiotics for this.  In addition patient's urinalysis was consistent with infection and urine sent for culture.  Patient's initial lactic  acid was slightly elevated in the low twos.  Repeat even with being given some fluids it raised to 2.75 so this is also concerning.  Patient will be admitted to the hospitalist service.  Started on broad-spectrum antibiotics for H CAP pneumonia.  And this will also cover urinary tract infection.  Patient shortness of breath is starting to show some signs of improvement following the antibiotics.  Final Clinical Impressions(s) / ED Diagnoses   Final diagnoses:  HCAP (healthcare-associated pneumonia)  Shortness of breath  Acute cystitis without hematuria    ED Discharge Orders    None       Vanetta Mulders, MD 01/12/18 1650

## 2018-01-12 NOTE — ED Notes (Signed)
Report given to shirley rn on 2w

## 2018-01-12 NOTE — ED Triage Notes (Signed)
PT arrives via EMS from [facility]. PT woke up this AM with chest pressure and SOB. Upon EMS arrival lung sounds were clear. PT developed wheezing in route. PT was given 5mg  albuterol breathing treatment. Wheezing and chest pressure improved. PT had 324 ASA as well. PT has history of alzheimers and is at baseline mental status per facility.

## 2018-01-12 NOTE — H&P (Signed)
History and Physical    Darlene Wilson ZOX:[id number] DOB: 07-08-1926 DOA: 01/12/2018  PCP: [name], MD   Patient coming from: ALF   Chief Complaint: SOB  HPI: Darlene Wilson is a 84 y.o. female with medical history significant of dementia, CVA with deficits, chronic pain, hypothyroidism, hypertension who comes in from nursing home with shortness of breath.  History is limited by patient's dementia.  Patient was apparently nursing home and became progressively short of breath and EMS was called.  At the time her vitals were reportedly stable however she was noted to be wheezing and she was given a bronchodilator with improvement in symptoms.  Patient is an unreliable historian but apparently she reports that she has had intermittent episodes of shortness of breath and difficulty catching her breath.  She does not have any significant dyspnea on exertion.  She did not have any chest pain, nausea, vomiting, diarrhea, cough, congestion, rhinorrhea.  She has not had any fevers or productive sputum.  Per discussion with the patient's daughter-in-law who is the caregiver the patient often has some shortness of breath and then becomes anxious that subsequent exacerbates her shortness of breath.  ED Course: In the ED patient's vitals were notable for tachypnea.  Troponins were negative.  Labs are notable for a normal lactate there is an increase to 2.75 after some IV fluids.  Chest x-ray was normal.  CTA for PE was negative for PE but did show possible small pneumonia.  Blood cultures were taken and antibiotics were given for presumed H CAP.   Review of Systems: As per HPI otherwise 10 point review of systems negative.   Past Medical History:  Diagnosis Date  . Chronic kidney disease 1998   stones  . Depression with anxiety 09/14/2014  . Fibrocystic breast 01/12/2011  . Glaucoma    both eyes  . History of chicken pox   . Hypertension   . Insomnia 01/13/2011  . Kidney stone    stones   .  Memory loss 10/12/2012  . Nausea 05/31/2012  . Spinal stenosis 06/14/2011  . Stroke (HCC) 04/2007   x 3 total  . Thyroid disease    hypothyroid  . Urinary frequency 02/09/2011  . Vertigo 02/09/2011    Past Surgical History:  Procedure Laterality Date  . ABDOMINAL HYSTERECTOMY  1963  . BACK SURGERY  1989   discectomy L4-5 1989, good results  . BREAST SURGERY  1970   biopsy, benign, b/l     reports that she has never smoked. She has never used smokeless tobacco. She reports that she does not drink alcohol or use drugs.  Allergies  Allergen Reactions  . Ciprofloxacin Hives    Given with 2 other meds/unsure if truly allergic  . Combigan [Brimonidine Tartrate-Timolol] Other (See Comments)    Inflamation  . Detrol [Tolterodine Tartrate] Hives    Given with 2 other medications/unsure if truly allergic  . Oxycodone Hives    Given with 2 other medications/unsure if truly allergic  . Penicillins Hives, Itching and Rash  . Sulfa Antibiotics Hives, Itching and Rash    Family History  Problem Relation Age of Onset  . Heart disease Mother   . Hypertension Mother   . Diabetes Mother   . Heart disease Father   . Hypertension Father   . Cancer Sister        breast cancer  . Mental illness Brother        service connected   Unacceptable: Noncontributory,  unremarkable, or negative. Acceptable: Family history reviewed and not pertinent (If you reviewed it)  Prior to Admission medications   Medication Sig Start Date End Date Taking? Authorizing Provider  acetaminophen (TYLENOL) 325 MG tablet Take 2 tablets (650 mg total) by mouth every 6 (six) hours as needed. Patient taking differently: Take 650 mg by mouth daily.  11/21/12  Yes Christiane HaSullivan, Corinna L, MD  amLODipine (NORVASC) 5 MG tablet Take 1 tablet (5 mg total) by mouth daily. 03/30/15  Yes Bradd CanaryBlyth, Stacey A, MD  betaxolol (BETOPTIC-S) 0.5 % ophthalmic suspension Place 1 drop into both eyes 2 (two) times daily. 12/15/17  Yes Bradd CanaryBlyth, Stacey  A, MD  citalopram (CELEXA) 10 MG tablet Take 1 tablet (10 mg total) by mouth daily. Patient taking differently: Take 5 mg by mouth daily.  04/02/15  Yes Bradd CanaryBlyth, Stacey A, MD  dipyridamole-aspirin (AGGRENOX) 200-25 MG per 12 hr capsule TAKE 1 CAPSULE BY MOUTH 2 (TWO) TIMES DAILY. Patient taking differently: Take 1 capsule by mouth 2 (two) times daily.  09/30/14  Yes Bradd CanaryBlyth, Stacey A, MD  donepezil (ARICEPT) 10 MG tablet Take 1 tablet (10 mg total) by mouth at bedtime. 09/08/14  Yes Bradd CanaryBlyth, Stacey A, MD  gabapentin (NEURONTIN) 100 MG capsule TAKE 1 CAPSULE (100 MG TOTAL) BY MOUTH AT BEDTIME. Patient taking differently: Take 100 mg by mouth at bedtime.  04/10/15  Yes Bradd CanaryBlyth, Stacey A, MD  latanoprost (XALATAN) 0.005 % ophthalmic solution Place 1 drop into both eyes at bedtime. 05/02/14  Yes Bradd CanaryBlyth, Stacey A, MD  levothyroxine (SYNTHROID, LEVOTHROID) 75 MCG tablet Take 1 tablet (75 mcg total) by mouth daily. Patient taking differently: Take 37.5 mcg by mouth daily.  06/25/14  Yes Bradd CanaryBlyth, Stacey A, MD  loperamide (IMODIUM A-D) 2 MG tablet Take 2 mg by mouth every 6 (six) hours as needed for diarrhea or loose stools.    Yes [provider]  Menthol, Topical Analgesic, (BIOFREEZE) 4 % GEL Apply 1 application topically every 12 (twelve) hours as needed (for right shoulder pain).   Yes [provider]  ondansetron (ZOFRAN) 4 MG tablet Take 4 mg by mouth every 8 (eight) hours as needed for nausea or vomiting.    Yes [provider]  timolol (BETIMOL) 0.5 % ophthalmic solution Place 1 drop into both eyes every morning.   Yes [provider]  lisinopril (PRINIVIL,ZESTRIL) 10 MG tablet 1 tab po bid Patient not taking: Reported on 01/12/2018 11/14/14   Esperanza RichtersSaguier, Edward, PA-C    Physical Exam: Vitals:   01/12/18 1000 01/12/18 1030 01/12/18 1100 01/12/18 1325  BP:  (!) 115/51 (!) 127/55   Pulse: 82 83 91   Resp: (!) 26 (!) 29 (!) 27   Temp: 97.6 F (36.4 C)     TempSrc: Rectal       SpO2: 98% 98% 98%   Weight:    63.5 kg  Height:    5\' 5"  (1.651 m)    Constitutional: NAD, calm, comfortable Vitals:   01/12/18 1000 01/12/18 1030 01/12/18 1100 01/12/18 1325  BP:  (!) 115/51 (!) 127/55   Pulse: 82 83 91   Resp: (!) 26 (!) 29 (!) 27   Temp: 97.6 F (36.4 C)     TempSrc: Rectal     SpO2: 98% 98% 98%   Weight:    63.5 kg  Height:    5\' 5"  (1.651 m)   Eyes: Anicteric sclera ENMT: Mucous membranes, edentulous.  Neck: normal, supple Respiratory: Tachypnea, no increased work of  breathing, no wheezes, rhonchi, rales Cardiovascular: Regular rate and rhythm, no murmurs Abdomen: no tenderness, no masses palpated. No hepatosplenomegaly. Bowel sounds positive.  Musculoskeletal: Trace lower extremity edema Skin: no rashes visible skin Neurologic: Mild left-sided weakness particularly in left lower extremity Psychiatric: Cannot assess due to underlying dementia  Labs on Admission: I have personally reviewed following labs and imaging studies  CBC: Recent Labs  Lab 01/12/18 0842 01/12/18 0856  WBC 8.6  --   HGB 13.0 14.3  HCT 42.8 42.0  MCV 103.4*  --   PLT 174  --    Basic Metabolic Panel: Recent Labs  Lab 01/12/18 0842 01/12/18 0856  NA 141 141  K 3.6 4.3  CL 106 104  CO2 27  --   GLUCOSE 113* 107*  BUN 15 22  CREATININE 0.77 0.70  CALCIUM 8.5*  --    GFR: Estimated Creatinine Clearance: 43.7 mL/min (by C-G formula based on SCr of 0.7 mg/dL). Liver Function Tests: Recent Labs  Lab 01/12/18 0842  AST 24  ALT 16  ALKPHOS 157*  BILITOT 0.7  PROT 6.1*  ALBUMIN 3.3*   No results for input(s): LIPASE, AMYLASE in the last 168 hours. No results for input(s): AMMONIA in the last 168 hours. Coagulation Profile: No results for input(s): INR, PROTIME in the last 168 hours. Cardiac Enzymes: No results for input(s): CKTOTAL, CKMB, CKMBINDEX, TROPONINI in the last 168 hours. BNP (last 3 results) No results for input(s): PROBNP in the last 8760  hours. HbA1C: No results for input(s): HGBA1C in the last 72 hours. CBG: No results for input(s): GLUCAP in the last 168 hours. Lipid Profile: No results for input(s): CHOL, HDL, LDLCALC, TRIG, CHOLHDL, LDLDIRECT in the last 72 hours. Thyroid Function Tests: No results for input(s): TSH, T4TOTAL, FREET4, T3FREE, THYROIDAB in the last 72 hours. Anemia Panel: No results for input(s): VITAMINB12, FOLATE, FERRITIN, TIBC, IRON, RETICCTPCT in the last 72 hours. Urine analysis:    Component Value Date/Time   COLORURINE YELLOW 01/12/2018 0843   APPEARANCEUR CLOUDY (A) 01/12/2018 0843   LABSPEC 1.021 01/12/2018 0843   PHURINE 7.0 01/12/2018 0843   GLUCOSEU NEGATIVE 01/12/2018 0843   HGBUR NEGATIVE 01/12/2018 0843   BILIRUBINUR NEGATIVE 01/12/2018 0843   BILIRUBINUR neg 05/31/2012 1112   KETONESUR NEGATIVE 01/12/2018 0843   PROTEINUR NEGATIVE 01/12/2018 0843   UROBILINOGEN 1.0 03/08/2015 1526   NITRITE NEGATIVE 01/12/2018 0843   LEUKOCYTESUR MODERATE (A) 01/12/2018 0843    Radiological Exams on Admission: Ct Angio Chest Pe W/cm &/or Wo Cm  Result Date: 01/12/2018 CLINICAL DATA:  Chest pressure and shortness of breath since this morning. EXAM: CT ANGIOGRAPHY CHEST WITH CONTRAST TECHNIQUE: Multidetector CT imaging of the chest was performed using the standard protocol during bolus administration of intravenous contrast. Multiplanar CT image reconstructions and MIPs were obtained to evaluate the vascular anatomy. CONTRAST:  100mL ISOVUE-370 IOPAMIDOL (ISOVUE-370) INJECTION 76% COMPARISON:  10/21/2014 FINDINGS: Cardiovascular: Heart is normal size. Mild calcified plaque over the thoracic aorta. Calcified plaque over the left anterior descending coronary artery. Pulmonary arterial system is normal without emboli. Mediastinum/Nodes: No mediastinal or hilar adenopathy. Remaining mediastinal structures are within normal. Lungs/Pleura: Lungs are adequately inflated demonstrate subtle peripheral  reticulonodular opacification over the posterolateral right upper lobe. Minimal atelectasis in the right lower lobe adjacent elevated hemidiaphragm. No evidence of effusion. Airways are within normal. Upper Abdomen: No acute findings. Possible punctate nonobstructing stone over the mid pole left kidney. Calcified plaque over the abdominal aorta. Musculoskeletal: Mild degenerate change  of the spine. Review of the MIP images confirms the above findings. IMPRESSION: No evidence of pulmonary embolism. Subtle focal reticulonodular opacification over the posterolateral right upper lobe which may be due to focal infectious or inflammatory process. Recommend follow-up CT 4-6 weeks. Minimal atherosclerotic coronary artery disease. Aortic Atherosclerosis (ICD10-I70.0). Nonobstructing punctate left renal stone Electronically Signed   By: Elberta Fortis M.D.   On: 01/12/2018 13:25   Dg Chest Port 1 View  Result Date: 01/12/2018 CLINICAL DATA:  Shortness of breath and chest pain. EXAM: PORTABLE CHEST 1 VIEW COMPARISON:  07/26/2017 FINDINGS: Lungs are adequately inflated without airspace consolidation or effusion. Cardiomediastinal silhouette and remainder of the exam is unchanged. IMPRESSION: No active disease. Electronically Signed   By: Elberta Fortis M.D.   On: 01/12/2018 09:02    EKG: Independently reviewed.  Sinus, no acute ST segment changes, relatively unchanged from prior  Assessment/Plan Principal Problem:   HCAP (healthcare-associated pneumonia) Active Problems:   Hypertension   Stroke [facility])   Thyroid disease   Memory loss   Hemiplegia of dominant side as late effect following cerebrovascular disease (HCC)   Depression with anxiety   Alzheimer disease    #) Shortness of breath due to H CAP: While nothing is noted on her chest x-ray she does have some evidence of possible pneumonia on CT of the chest.  She has been afebrile and her white count is reassuring.  It is not clear how severe or even  relevant this pneumonia as to her presentation.  There is appear to be a strong component of anxiety to this as well.  Additionally while she does have a remote smoking history she has no history of COPD and query whether a viral illness could contribute to the symptoms. -Every 6 hours short acting bronchodilators -Continue IV cefepime and vancomycin started 01/12/2018 -Follow-up blood cultures ordered 01/12/2018 -Procalcitonin ordered  #) Hypertension: -Continue amlodipine 5 mg daily - Continue lisinopril 10 mg daily  #) CVA: Deficits on the left appears stable next line-continue Aggrenox twice daily  #) Hypothyroid ism: -Continue levothyroxine 37.5 mcg daily  #)  dementia: -Continue donepezil 10 mg nightly  #)  pain/psych: -Continue citalopram 5 mg daily -Continue gabapentin 100 mg nightly  Fluids: Tolerating p.o. Electrodes: Monitor and supplement Nutrition: Heart healthy diet   prophylaxis: Enoxaparin  Disposition: Pending 24 hours antibiotics  DO NOT RESUSCITATE    Delaine Lame MD [facility]  If 7PM-7AM, please contact night-coverage [email or web address] Password [email or web address]  01/12/2018, 3:09 PM

## 2018-01-13 DIAGNOSIS — Z66 Do not resuscitate: Secondary | ICD-10-CM | POA: Diagnosis present

## 2018-01-13 DIAGNOSIS — Z803 Family history of malignant neoplasm of breast: Secondary | ICD-10-CM | POA: Diagnosis not present

## 2018-01-13 DIAGNOSIS — I69351 Hemiplegia and hemiparesis following cerebral infarction affecting right dominant side: Secondary | ICD-10-CM | POA: Diagnosis not present

## 2018-01-13 DIAGNOSIS — J189 Pneumonia, unspecified organism: Principal | ICD-10-CM

## 2018-01-13 DIAGNOSIS — N3000 Acute cystitis without hematuria: Secondary | ICD-10-CM | POA: Diagnosis present

## 2018-01-13 DIAGNOSIS — Z880 Allergy status to penicillin: Secondary | ICD-10-CM | POA: Diagnosis not present

## 2018-01-13 DIAGNOSIS — F418 Other specified anxiety disorders: Secondary | ICD-10-CM | POA: Diagnosis present

## 2018-01-13 DIAGNOSIS — G309 Alzheimer's disease, unspecified: Secondary | ICD-10-CM | POA: Diagnosis present

## 2018-01-13 DIAGNOSIS — G8929 Other chronic pain: Secondary | ICD-10-CM | POA: Diagnosis present

## 2018-01-13 DIAGNOSIS — Z79899 Other long term (current) drug therapy: Secondary | ICD-10-CM | POA: Diagnosis not present

## 2018-01-13 DIAGNOSIS — H9190 Unspecified hearing loss, unspecified ear: Secondary | ICD-10-CM | POA: Diagnosis present

## 2018-01-13 DIAGNOSIS — Y95 Nosocomial condition: Secondary | ICD-10-CM | POA: Diagnosis present

## 2018-01-13 DIAGNOSIS — E039 Hypothyroidism, unspecified: Secondary | ICD-10-CM | POA: Diagnosis present

## 2018-01-13 DIAGNOSIS — Z885 Allergy status to narcotic agent status: Secondary | ICD-10-CM | POA: Diagnosis not present

## 2018-01-13 DIAGNOSIS — Z881 Allergy status to other antibiotic agents status: Secondary | ICD-10-CM | POA: Diagnosis not present

## 2018-01-13 DIAGNOSIS — F0281 Dementia in other diseases classified elsewhere with behavioral disturbance: Secondary | ICD-10-CM | POA: Diagnosis present

## 2018-01-13 DIAGNOSIS — R0602 Shortness of breath: Secondary | ICD-10-CM | POA: Diagnosis not present

## 2018-01-13 DIAGNOSIS — Z882 Allergy status to sulfonamides status: Secondary | ICD-10-CM | POA: Diagnosis not present

## 2018-01-13 DIAGNOSIS — Z79890 Hormone replacement therapy (postmenopausal): Secondary | ICD-10-CM | POA: Diagnosis not present

## 2018-01-13 DIAGNOSIS — Z8249 Family history of ischemic heart disease and other diseases of the circulatory system: Secondary | ICD-10-CM | POA: Diagnosis not present

## 2018-01-13 DIAGNOSIS — H409 Unspecified glaucoma: Secondary | ICD-10-CM | POA: Diagnosis present

## 2018-01-13 DIAGNOSIS — N6019 Diffuse cystic mastopathy of unspecified breast: Secondary | ICD-10-CM | POA: Diagnosis present

## 2018-01-13 DIAGNOSIS — I10 Essential (primary) hypertension: Secondary | ICD-10-CM | POA: Diagnosis present

## 2018-01-13 DIAGNOSIS — Z87442 Personal history of urinary calculi: Secondary | ICD-10-CM | POA: Diagnosis not present

## 2018-01-13 DIAGNOSIS — Z90710 Acquired absence of both cervix and uterus: Secondary | ICD-10-CM | POA: Diagnosis not present

## 2018-01-13 LAB — URINE CULTURE

## 2018-01-13 MED ORDER — SODIUM CHLORIDE 0.9 % IV SOLN
500.0000 mg | INTRAVENOUS | Status: DC
  Administered 2018-01-13 – 2018-01-14 (×2): 500 mg via INTRAVENOUS
  Filled 2018-01-13 (×3): qty 500

## 2018-01-13 MED ORDER — SODIUM CHLORIDE 0.9 % IV SOLN
1.0000 g | INTRAVENOUS | Status: DC
  Administered 2018-01-13 – 2018-01-14 (×2): 1 g via INTRAVENOUS
  Filled 2018-01-13 (×3): qty 10

## 2018-01-13 NOTE — Progress Notes (Signed)
Patient Demographics:    Darlene Wilson, is a 84 y.o. female, DOB - [birth year], ZOX:[id number]  Admit date - 01/12/2018   Admitting Physician Delaine LameShrey C Purohit, MD  Outpatient Primary MD for the patient is Bradd CanaryBlyth, Stacey A, MD  LOS - 0   Chief Complaint  Patient presents with  . Chest Pain  . Shortness of Breath        Subjective:    Darlene Wilson today has no fevers, no emesis,  No chest pain, remains confused and disoriented, requiring one-to-one sitter  Assessment  & Plan :    Principal Problem:   HCAP (healthcare-associated pneumonia) Active Problems:   Hypertension   Stroke [facility](HCC)   Thyroid disease   Memory loss   Hemiplegia of dominant side as late effect following cerebrovascular disease (HCC)   Depression with anxiety   Alzheimer disease  Brief summary 84 y.o. female with medical history significant of dementia, CVA with deficits, chronic pain, hypothyroidism, hypertension admitted on 01/12/2018 from nursing home with concerns about right-sided pneumonia and dyspnea with a lactic acid  Plan:- 1)Possible HCAP--patient remains afebrile, no leukocytosis, procalcitonin is not particularly elevated, okay to de-escalate antibiotics from Vanco and cefepime to Rocephin and azithromycin pending blood cultures, continue bronchodilators and mucolytics  2) dementia with behavioral disturbance--- patient with significant cognitive deficits, requiring one-to-one sitter, continue Aricept and Celexa  3)HTN--- stable, continue amlodipine 5 mg daily  4)H/o Prior CVA--continue Aggrenox for secondary stroke prophylaxis  Code Status : DNR   Disposition Plan  : NH   DVT Prophylaxis  :  Lovenox / SCDs   Lab Results  Component Value Date   PLT 174 01/12/2018    Inpatient Medications  Scheduled Meds: . amLODipine  5 mg Oral Daily  . citalopram  5 mg Oral Daily  . dipyridamole-aspirin  1 capsule  Oral BID  . donepezil  10 mg Oral QHS  . enoxaparin (LOVENOX) injection  40 mg Subcutaneous Q24H  . gabapentin  100 mg Oral QHS  . latanoprost  1 drop Both Eyes QHS  . levothyroxine  37.5 mcg Oral QAC breakfast  . sodium chloride flush  3 mL Intravenous Q12H  . timolol  1 drop Both Eyes Daily   Continuous Infusions: . sodium chloride 75 mL/hr at 01/12/18 1214  . sodium chloride    . ceFEPime (MAXIPIME) IV 2 g (01/13/18 1000)  . vancomycin 500 mg (01/13/18 0817)   PRN Meds:.sodium chloride, acetaminophen **OR** acetaminophen, ipratropium-albuterol, loperamide, MUSCLE RUB, ondansetron **OR** ondansetron (ZOFRAN) IV, polyethylene glycol, sodium chloride flush    Anti-infectives (From admission, onward)   Start     Dose/Rate Route Frequency Ordered Stop   01/13/18 1000  ceFEPIme (MAXIPIME) 2 g in sodium chloride 0.9 % 100 mL IVPB     2 g 200 mL/hr over 30 Minutes Intravenous Every 24 hours 01/12/18 1353     01/13/18 0800  vancomycin (VANCOCIN) 500 mg in sodium chloride 0.9 % 100 mL IVPB     500 mg 100 mL/hr over 60 Minutes Intravenous Every 12 hours 01/12/18 1348     01/12/18 1400  vancomycin (VANCOCIN) IVPB 1000 mg/200 mL premix     1,000 mg 200 mL/hr over 60 Minutes Intravenous  Once 01/12/18 1348 01/12/18 1612  01/12/18 1400  ceFEPIme (MAXIPIME) 2 g in sodium chloride 0.9 % 100 mL IVPB     2 g 200 mL/hr over 30 Minutes Intravenous  Once 01/12/18 1352 01/12/18 1514   01/12/18 1345  aztreonam (AZACTAM) 2 g in sodium chloride 0.9 % 100 mL IVPB  Status:  Discontinued     2 g 200 mL/hr over 30 Minutes Intravenous  Once 01/12/18 1334 01/12/18 1336   01/12/18 1345  aztreonam (AZACTAM) 2 g in sodium chloride 0.9 % 100 mL IVPB  Status:  Discontinued     2 g 200 mL/hr over 30 Minutes Intravenous  Once 01/12/18 1337 01/12/18 1352        Objective:   Vitals:   01/12/18 1530 01/12/18 1550 01/13/18 0000 01/13/18 0742  BP: (!) 129/55  (!) 147/63 129/66  Pulse: 91  79 69  Resp: (!) 21    18  Temp:  98.9 F (37.2 C) 99.2 F (37.3 C) 97.7 F (36.5 C)  TempSrc:  Oral Oral Oral  SpO2: 97%  96% 100%  Weight:      Height:        Wt Readings from Last 3 Encounters:  01/12/18 63.5 kg  08/21/17 64.2 kg  07/26/17 61.2 kg     Intake/Output Summary (Last 24 hours) at 01/13/2018 1512 Last data filed at 01/13/2018 0700 Gross per 24 hour  Intake 1475 ml  Output 2050 ml  Net -575 ml     Physical Exam  Gen:- Awake Alert, pleasantly confused HEENT:- [place].AT, No sclera icterus Neck-Supple Neck,No JVD,.  Lungs-diminished in bases few scattered rhonchi on right upper lung fields  CV- S1, S2 normal Abd-  +ve B.Sounds, Abd Soft, No tenderness,    Extremity/Skin:- No  edema,   , good pulses Psych-obvious cognitive deficits, Neuro-no new focal deficits, no tremors   Data Review:   Micro Results Recent Results (from the past 240 hour(s))  Culture, blood (Routine X 2) w Reflex to ID Panel     Status: None (Preliminary result)   Collection Time: 01/12/18  9:10 AM  Result Value Ref Range Status   Specimen Description BLOOD RIGHT ANTECUBITAL  Final   Special Requests   Final    BOTTLES DRAWN AEROBIC AND ANAEROBIC Blood Culture adequate volume   Culture   Final    NO GROWTH 1 DAY Performed at [facility], [address] [place], [place] [address]    Report Status PENDING  Incomplete  Culture, blood (Routine X 2) w Reflex to ID Panel     Status: None (Preliminary result)   Collection Time: 01/12/18  9:15 AM  Result Value Ref Range Status   Specimen Description BLOOD RIGHT FOREARM  Final   Special Requests   Final    BOTTLES DRAWN AEROBIC AND ANAEROBIC Blood Culture adequate volume   Culture   Final    NO GROWTH 1 DAY Performed at [facility], [address] [place], [place] [address]    Report Status PENDING  Incomplete  Urine Culture     Status: Abnormal   Collection Time: 01/12/18 12:07 PM  Result Value Ref Range Status   Specimen Description  URINE, CLEAN CATCH  Final   Special Requests   Final    NONE Performed at [facility], [address] [place], [place] [address]    Culture MULTIPLE SPECIES PRESENT, SUGGEST RECOLLECTION (A)  Final   Report Status 01/13/2018 FINAL  Final    Radiology Reports  Ct Angio Chest Pe W/cm &/or Wo Cm  Result Date: 01/12/2018 CLINICAL DATA:  Chest pressure and shortness of breath since this morning. EXAM: CT ANGIOGRAPHY CHEST WITH CONTRAST TECHNIQUE: Multidetector CT imaging of the chest was performed using the standard protocol during bolus administration of intravenous contrast. Multiplanar CT image reconstructions and MIPs were obtained to evaluate the vascular anatomy. CONTRAST:  ISOVUE-370 IOPAMIDOL (ISOVUE-370) INJECTION 76% COMPARISON:  10/21/2014 FINDINGS: Cardiovascular: Heart is normal size. Mild calcified plaque over the thoracic aorta. Calcified plaque over the left anterior descending coronary artery. Pulmonary arterial system is normal without emboli. Mediastinum/Nodes: No mediastinal or hilar adenopathy. Remaining mediastinal structures are within normal. Lungs/Pleura: Lungs are adequately inflated demonstrate subtle peripheral reticulonodular opacification over the posterolateral right upper lobe. Minimal atelectasis in the right lower lobe adjacent elevated hemidiaphragm. No evidence of effusion. Airways are within normal. Upper Abdomen: No acute findings. Possible punctate nonobstructing stone over the mid pole left kidney. Calcified plaque over the abdominal aorta. Musculoskeletal: Mild degenerate change of the spine. Review of the MIP images confirms the above findings. IMPRESSION: No evidence of pulmonary embolism. Subtle focal reticulonodular opacification over the posterolateral right upper lobe which may be due to focal infectious or inflammatory process. Recommend follow-up CT 4-6 weeks. Minimal atherosclerotic coronary artery disease. Aortic Atherosclerosis (ICD10-I70.0).  Nonobstructing punctate left renal stone Electronically Signed   By: Elberta Fortis M.D.   On: 01/12/2018 13:25   Dg Chest Port 1 View  Result Date: 01/12/2018 CLINICAL DATA:  Shortness of breath and chest pain. EXAM: PORTABLE CHEST 1 VIEW COMPARISON:  07/26/2017 FINDINGS: Lungs are adequately inflated without airspace consolidation or effusion. Cardiomediastinal silhouette and remainder of the exam is unchanged. IMPRESSION: No active disease. Electronically Signed   By: Elberta Fortis M.D.   On: 01/12/2018 09:02     CBC Recent Labs  Lab 01/12/18 0842 01/12/18 0856  WBC 8.6  --   HGB 13.0 14.3  HCT 42.8 42.0  PLT 174  --   MCV 103.4*  --   MCH 31.4  --   MCHC 30.4  --   RDW 14.6  --     Chemistries  Recent Labs  Lab 01/12/18 0842 01/12/18 0856  NA 141 141  K 3.6 4.3  CL 106 104  CO2 27  --   GLUCOSE 113* 107*  BUN 15 22  CREATININE 0.77 0.70  CALCIUM 8.5*  --   AST 24  --   ALT 16  --   ALKPHOS 157*  --   BILITOT 0.7  --    ------------------------------------------------------------------------------------------------------------------ No results for input(s): CHOL, HDL, LDLCALC, TRIG, CHOLHDL, LDLDIRECT in the last 72 hours.  Lab Results  Component Value Date   HGBA1C 5.9 12/15/2014   ------------------------------------------------------------------------------------------------------------------ No results for input(s): TSH, T4TOTAL, T3FREE, THYROIDAB in the last 72 hours.  Invalid input(s): FREET3 ------------------------------------------------------------------------------------------------------------------ No results for input(s): VITAMINB12, FOLATE, FERRITIN, TIBC, IRON, RETICCTPCT in the last 72 hours.  Coagulation profile No results for input(s): INR, PROTIME in the last 168 hours.  No results for input(s): DDIMER in the last 72 hours.  Cardiac Enzymes No results for input(s): CKMB, TROPONINI, MYOGLOBIN in the last 168 hours.  Invalid input(s):  CK ------------------------------------------------------------------------------------------------------------------    Component Value Date/Time   BNP 41.5 01/12/2018 0843     Shon Hale M.D on 01/13/2018 at 3:12 PM   Go to [email or web address] - password [email or web address] for contact info  [facility] - Office  [phone]

## 2018-01-14 LAB — PROCALCITONIN: Procalcitonin: <0.1 ng/mL

## 2018-01-14 NOTE — Progress Notes (Addendum)
Patient Demographics:    Darlene Wilson, is a 84 y.o. female, DOB - 27-Nov-1926, ZOX:[id number]  Admit date - 01/12/2018   Admitting Physician Delaine Lame, MD  Outpatient Primary MD for the patient is [name], MD  LOS - 1   Chief Complaint  Patient presents with  . Chest Pain  . Shortness of Breath        Subjective:    Darlene Wilson today has no fevers, no emesis,  No chest pain, less confused, disoriented to place, occasional cough  Assessment  & Plan :    Principal Problem:   HCAP (healthcare-associated pneumonia) Active Problems:   Hypertension   Stroke [facility])   Thyroid disease   Memory loss   Hemiplegia of dominant side as late effect following cerebrovascular disease (HCC)   Depression with anxiety   Alzheimer disease  Brief summary 84 y.o. female with medical history significant of dementia, CVA with deficits, chronic pain, hypothyroidism, hypertension admitted on 01/12/2018 from nursing home with concerns about right-sided pneumonia and dyspnea with a lactic acid  Plan:- 1)Possible HCAP--occasional coughing spells, c/n Rocephin and azithromycin pending blood cultures, continue bronchodilators and mucolytics, confusional episodes from time to time  2) dementia with behavioral disturbance--- she is cooperative, patient with significant cognitive deficits, requiring one-to-one sitter, continue Aricept and Celexa  3)HTN--- stable, continue amlodipine 5 mg daily  4)H/o Prior CVA--continue Aggrenox for secondary stroke prophylaxis  Code Status : DNR   Disposition Plan  : Discharge back to living facility on Monday, 01/15/2018 if she continues to improve   DVT Prophylaxis  :  Lovenox / SCDs   Lab Results  Component Value Date   PLT 174 01/12/2018    Inpatient Medications  Scheduled Meds: . amLODipine  5 mg Oral Daily  . citalopram  5 mg Oral Daily  .  dipyridamole-aspirin  1 capsule Oral BID  . donepezil  10 mg Oral QHS  . enoxaparin (LOVENOX) injection  40 mg Subcutaneous Q24H  . gabapentin  100 mg Oral QHS  . latanoprost  1 drop Both Eyes QHS  . levothyroxine  37.5 mcg Oral QAC breakfast  . sodium chloride flush  3 mL Intravenous Q12H  . timolol  1 drop Both Eyes Daily   Continuous Infusions: . sodium chloride 40 mL/hr at 01/13/18 1230  . sodium chloride    . azithromycin 500 mg (01/13/18 1700)  . cefTRIAXone (ROCEPHIN)  IV 1 g (01/13/18 1600)   PRN Meds:.sodium chloride, acetaminophen **OR** acetaminophen, ipratropium-albuterol, loperamide, MUSCLE RUB, ondansetron **OR** ondansetron (ZOFRAN) IV, polyethylene glycol, sodium chloride flush    Anti-infectives (From admission, onward)   Start     Dose/Rate Route Frequency Ordered Stop   01/13/18 1700  azithromycin (ZITHROMAX) 500 mg in sodium chloride 0.9 % 250 mL IVPB     500 mg 250 mL/hr over 60 Minutes Intravenous Every 24 hours 01/13/18 1533     01/13/18 1600  cefTRIAXone (ROCEPHIN) 1 g in sodium chloride 0.9 % 100 mL IVPB     1 g 200 mL/hr over 30 Minutes Intravenous Every 24 hours 01/13/18 1533     01/13/18 1000  ceFEPIme (MAXIPIME) 2 g in sodium chloride 0.9 % 100 mL IVPB  Status:  Discontinued  2 g 200 mL/hr over 30 Minutes Intravenous Every 24 hours 01/12/18 1353 01/13/18 1533   01/13/18 0800  vancomycin (VANCOCIN) 500 mg in sodium chloride 0.9 % 100 mL IVPB  Status:  Discontinued     500 mg 100 mL/hr over 60 Minutes Intravenous Every 12 hours 01/12/18 1348 01/13/18 1533   01/12/18 1400  vancomycin (VANCOCIN) IVPB 1000 mg/200 mL premix     1,000 mg 200 mL/hr over 60 Minutes Intravenous  Once 01/12/18 1348 01/12/18 1612   01/12/18 1400  ceFEPIme (MAXIPIME) 2 g in sodium chloride 0.9 % 100 mL IVPB     2 g 200 mL/hr over 30 Minutes Intravenous  Once 01/12/18 1352 01/12/18 1514   01/12/18 1345  aztreonam (AZACTAM) 2 g in sodium chloride 0.9 % 100 mL IVPB  Status:   Discontinued     2 g 200 mL/hr over 30 Minutes Intravenous  Once 01/12/18 1334 01/12/18 1336   01/12/18 1345  aztreonam (AZACTAM) 2 g in sodium chloride 0.9 % 100 mL IVPB  Status:  Discontinued     2 g 200 mL/hr over 30 Minutes Intravenous  Once 01/12/18 1337 01/12/18 1352        Objective:   Vitals:   01/13/18 1659 01/13/18 2201 01/14/18 0325 01/14/18 0725  BP: 131/69 139/60  (!) 156/73  Pulse: 83 78  71  Resp: 18  (!) 24 18  Temp: 98.8 F (37.1 C) 98.8 F (37.1 C)  98.6 F (37 C)  TempSrc: Oral Oral  Oral  SpO2: 95% 97% 98% 97%  Weight:      Height:        Wt Readings from Last 3 Encounters:  01/12/18 63.5 kg  08/21/17 64.2 kg  07/26/17 61.2 kg     Intake/Output Summary (Last 24 hours) at 01/14/2018 0858 Last data filed at 01/14/2018 0600 Gross per 24 hour  Intake 2130 ml  Output 1100 ml  Net 1030 ml     Physical Exam  Gen:- Awake Alert, pleasantly confused HEENT:- [place].AT, No sclera icterus Neck-Supple Neck,No JVD,.  Lungs-diminished in bases few scattered rhonchi on right upper lung fields, no wheezing CV- S1, S2 normal Abd-  +ve B.Sounds, Abd Soft, No tenderness,    Extremity/Skin:- No  edema,   , good pulses Psych-obvious cognitive deficits, Neuro-no new focal deficits, no tremors   Data Review:   Micro Results Recent Results (from the past 240 hour(s))  Culture, blood (Routine X 2) w Reflex to ID Panel     Status: None (Preliminary result)   Collection Time: 01/12/18  9:10 AM  Result Value Ref Range Status   Specimen Description BLOOD RIGHT ANTECUBITAL  Final   Special Requests   Final    BOTTLES DRAWN AEROBIC AND ANAEROBIC Blood Culture adequate volume   Culture   Final    NO GROWTH 1 DAY Performed at [facility], [address] [place], [place] [address]    Report Status PENDING  Incomplete  Culture, blood (Routine X 2) w Reflex to ID Panel     Status: None (Preliminary result)   Collection Time: 01/12/18  9:15 AM  Result Value Ref  Range Status   Specimen Description BLOOD RIGHT FOREARM  Final   Special Requests   Final    BOTTLES DRAWN AEROBIC AND ANAEROBIC Blood Culture adequate volume   Culture   Final    NO GROWTH 1 DAY Performed at [facility], [address] [place], [place] [address]  Report Status PENDING  Incomplete  Urine Culture     Status: Abnormal   Collection Time: 01/12/18 12:07 PM  Result Value Ref Range Status   Specimen Description URINE, CLEAN CATCH  Final   Special Requests   Final    NONE Performed at [facility], [address], [place], [place] [address]    Culture MULTIPLE SPECIES PRESENT, SUGGEST RECOLLECTION (A)  Final   Report Status 01/13/2018 FINAL  Final    Radiology Reports Ct Angio Chest Pe W/cm &/or Wo Cm  Result Date: 01/12/2018 CLINICAL DATA:  Chest pressure and shortness of breath since this morning. EXAM: CT ANGIOGRAPHY CHEST WITH CONTRAST TECHNIQUE: Multidetector CT imaging of the chest was performed using the standard protocol during bolus administration of intravenous contrast. Multiplanar CT image reconstructions and MIPs were obtained to evaluate the vascular anatomy. CONTRAST:  ISOVUE-370 IOPAMIDOL (ISOVUE-370) INJECTION 76% COMPARISON:  10/21/2014 FINDINGS: Cardiovascular: Heart is normal size. Mild calcified plaque over the thoracic aorta. Calcified plaque over the left anterior descending coronary artery. Pulmonary arterial system is normal without emboli. Mediastinum/Nodes: No mediastinal or hilar adenopathy. Remaining mediastinal structures are within normal. Lungs/Pleura: Lungs are adequately inflated demonstrate subtle peripheral reticulonodular opacification over the posterolateral right upper lobe. Minimal atelectasis in the right lower lobe adjacent elevated hemidiaphragm. No evidence of effusion. Airways are within normal. Upper Abdomen: No acute findings. Possible punctate nonobstructing stone over the mid pole left kidney. Calcified plaque  over the abdominal aorta. Musculoskeletal: Mild degenerate change of the spine. Review of the MIP images confirms the above findings. IMPRESSION: No evidence of pulmonary embolism. Subtle focal reticulonodular opacification over the posterolateral right upper lobe which may be due to focal infectious or inflammatory process. Recommend follow-up CT 4-6 weeks. Minimal atherosclerotic coronary artery disease. Aortic Atherosclerosis (ICD10-I70.0). Nonobstructing punctate left renal stone Electronically Signed   By: Elberta Fortis M.D.   On: 01/12/2018 13:25   Dg Chest Port 1 View  Result Date: 01/12/2018 CLINICAL DATA:  Shortness of breath and chest pain. EXAM: PORTABLE CHEST 1 VIEW COMPARISON:  07/26/2017 FINDINGS: Lungs are adequately inflated without airspace consolidation or effusion. Cardiomediastinal silhouette and remainder of the exam is unchanged. IMPRESSION: No active disease. Electronically Signed   By: Elberta Fortis M.D.   On: 01/12/2018 09:02     CBC Recent Labs  Lab 01/12/18 0842 01/12/18 0856  WBC 8.6  --   HGB 13.0 14.3  HCT 42.8 42.0  PLT 174  --   MCV 103.4*  --   MCH 31.4  --   MCHC 30.4  --   RDW 14.6  --     Chemistries  Recent Labs  Lab 01/12/18 0842 01/12/18 0856  NA 141 141  K 3.6 4.3  CL 106 104  CO2 27  --   GLUCOSE 113* 107*  BUN 15 22  CREATININE 0.77 0.70  CALCIUM 8.5*  --   AST 24  --   ALT 16  --   ALKPHOS 157*  --   BILITOT 0.7  --    ------------------------------------------------------------------------------------------------------------------ No results for input(s): CHOL, HDL, LDLCALC, TRIG, CHOLHDL, LDLDIRECT in the last 72 hours.  Lab Results  Component Value Date   HGBA1C 5.9 12/15/2014   ------------------------------------------------------------------------------------------------------------------ No results for input(s): TSH, T4TOTAL, T3FREE, THYROIDAB in the last 72 hours.  Invalid input(s):  FREET3 ------------------------------------------------------------------------------------------------------------------ No results for input(s): VITAMINB12, FOLATE, FERRITIN, TIBC, IRON, RETICCTPCT in the last 72 hours.  Coagulation profile No results for input(s): INR, PROTIME  in the last 168 hours.  No results for input(s): DDIMER in the last 72 hours.  Cardiac Enzymes No results for input(s): CKMB, TROPONINI, MYOGLOBIN in the last 168 hours.  Invalid input(s): CK ------------------------------------------------------------------------------------------------------------------    Component Value Date/Time   BNP 41.5 01/12/2018 0843   Shon Haleourage Tarrance Januszewski M.D on 01/14/2018 at 8:58 AM  Go to [email or web address] - password [email or web address] for contact info  [facility] - Office  [phone]

## 2018-01-15 LAB — GLUCOSE, CAPILLARY: Glucose-Capillary: 83 mg/dL (ref 70–99)

## 2018-01-15 MED ORDER — CEFDINIR 300 MG PO CAPS: 300.0000 mg | ORAL_CAPSULE | Freq: Two times a day (BID) | ORAL | 0 refills | Status: DC

## 2018-01-15 MED ORDER — AMLODIPINE BESYLATE 5 MG PO TABS: 5.0000 mg | ORAL_TABLET | Freq: Every day | ORAL | 6 refills | Status: AC

## 2018-01-15 MED ORDER — CITALOPRAM HYDROBROMIDE 10 MG PO TABS: 10.0000 mg | ORAL_TABLET | Freq: Every day | ORAL | 3 refills | Status: DC

## 2018-01-15 MED ORDER — AMLODIPINE BESYLATE 5 MG PO TABS: 5.0000 mg | ORAL_TABLET | Freq: Every day | ORAL | 6 refills | Status: DC

## 2018-01-15 MED ORDER — PREDNISONE 20 MG PO TABS: 20.0000 mg | ORAL_TABLET | Freq: Every day | ORAL | 0 refills | Status: AC

## 2018-01-15 MED ORDER — GUAIFENESIN ER 600 MG PO TB12: 600.0000 mg | ORAL_TABLET | Freq: Two times a day (BID) | ORAL | 0 refills | Status: AC

## 2018-01-15 MED ORDER — AZITHROMYCIN 500 MG PO TABS: 500.0000 mg | ORAL_TABLET | Freq: Every day | ORAL | 0 refills | Status: AC

## 2018-01-15 MED ORDER — ASPIRIN-DIPYRIDAMOLE ER 25-200 MG PO CP12: ORAL_CAPSULE | ORAL | 2 refills | Status: DC

## 2018-01-15 MED ORDER — GABAPENTIN 100 MG PO CAPS: ORAL_CAPSULE | ORAL | 1 refills | Status: AC

## 2018-01-15 MED ORDER — CEFDINIR 300 MG PO CAPS: 300.0000 mg | ORAL_CAPSULE | Freq: Two times a day (BID) | ORAL | 0 refills | Status: AC

## 2018-01-15 MED ORDER — LEVOTHYROXINE SODIUM 75 MCG PO TABS: 37.5000 ug | ORAL_TABLET | Freq: Every day | ORAL | 3 refills | Status: DC

## 2018-01-15 MED ORDER — LEVOTHYROXINE SODIUM 75 MCG PO TABS: 37.5000 ug | ORAL_TABLET | Freq: Every day | ORAL | 3 refills | Status: AC

## 2018-01-15 MED ORDER — PREDNISONE 20 MG PO TABS: 20.0000 mg | ORAL_TABLET | Freq: Every day | ORAL | 0 refills | Status: DC

## 2018-01-15 MED ORDER — ALBUTEROL SULFATE HFA 108 (90 BASE) MCG/ACT IN AERS: 2.0000 | INHALATION_SPRAY | RESPIRATORY_TRACT | 2 refills | Status: AC | PRN

## 2018-01-15 MED ORDER — AZITHROMYCIN 500 MG PO TABS: 500.0000 mg | ORAL_TABLET | Freq: Every day | ORAL | 0 refills | Status: DC

## 2018-01-15 NOTE — Progress Notes (Signed)
[facility] Anticipated discharge date: 8/19 Family notified: pt dtr-in-law Environmental health practitionerBecky Transportation by [place]  CSW signing off.  Burna SisJenna H. Georgine Wiltse, LCSW Clinical Social Worker [phone]

## 2018-01-15 NOTE — Care Management Note (Addendum)
Case Management Note  Patient Details  Name: Frederik SchmidtJanie B Millon MRN: [id number] Date of Birth: May 17, 1927  Subjective/Objective:     HCAP               Action/Plan: NCM spoke to pt and dtr in law at bedside. [facility]. She has RW and cane at facility. CSW following for return back to ALF.   Expected Discharge Date:  01/15/18               Expected Discharge Plan:  Assisted Living / Rest Home  In-House Referral:  NA, Clinical Social Work  Discharge planning Services  CM Consult  Post Acute Care Choice:  NA Choice offered to:  NA  DME Arranged:  N/A DME Agency:  NA  HH Arranged:  NA HH Agency:  NA  Status of Service:  Completed, signed off  If discussed at [employer] Length of Stay Meetings, dates discussed:    Additional Comments:  Elliot CousinShavis, Surena Welge Ellen, RN 01/15/2018, 11:07 AM

## 2018-01-15 NOTE — Care Management Important Message (Signed)
Important Message  Patient Details  Name: Darlene SchmidtJanie B Likes MRN: [id number] Date of Birth: Nov 12, 1926   Medicare Important Message Given:  Yes    Elliot CousinShavis, Korinne Greenstein Ellen, RN 01/15/2018, 11:06 AM

## 2018-01-15 NOTE — Discharge Instructions (Signed)
1) Fall Precautions 2)Take medications as prescribed 3) Follow-up with your primary doctor within 1 week for recheck

## 2018-01-15 NOTE — [place] FL2 (Addendum)
[place] MEDICAID FL2 LEVEL OF CARE SCREENING TOOL     IDENTIFICATION  Patient Name: Darlene SchmidtJanie B Woon Birthdate: [birth year] Sex: female Admission Date (Current Location): 01/12/2018  [facility] [place]   Facility and Address:  [facility], [address], [place], [place] [address]      Provider Number: [id number]  Attending Physician Name and Address:  Shon HaleEmokpae, Courage, MD  Relative Name and Phone Number:       Current Level of Care: Hospital Recommended Level of Care: ALF Prior Approval Number:    Date Approved/Denied:   PASRR Number:    Discharge Plan: ALF    Current Diagnoses: Patient Active Problem List   Diagnosis Date Noted  . HCAP (healthcare-associated pneumonia) 01/12/2018  . Alzheimer disease 08/10/2016  . Malnutrition of moderate degree 03/10/2015  . Syncope 03/08/2015  . Glaucoma 03/08/2015  . Hearing loss 09/14/2014  . Depression with anxiety 09/14/2014  . Medicare annual wellness visit, subsequent 04/13/2014  . Hyperglycemia 10/06/2013  . Neck pain 11/20/2012  . Dizziness and giddiness 11/08/2012  . Degeneration of cervical intervertebral disc 11/08/2012  . Cervical spondylosis without myelopathy 11/08/2012  . Other malaise and fatigue 11/08/2012  . Disturbance of skin sensation 11/08/2012  . Hemiplegia of dominant side as late effect following cerebrovascular disease (HCC) 11/08/2012  . Other late effects of cerebrovascular disease(438.89) 11/08/2012  . Memory loss 10/12/2012  . Spinal stenosis 06/14/2011  . Urinary frequency 02/09/2011  . Vertigo 02/09/2011  . Insomnia 01/13/2011  . Fibrocystic breast 01/12/2011  . Hypertension   . Kidney stone   . Thyroid disease   . Stroke (HCC) 04/30/2007    Orientation RESPIRATION BLADDER Height & Weight     Self, Place  Normal Continent Weight: 140 lb (63.5 kg) Height:  5\' 5"  (165.1 cm)  BEHAVIORAL SYMPTOMS/MOOD NEUROLOGICAL BOWEL NUTRITION STATUS    Continent Diet(no added salt)  AMBULATORY STATUS COMMUNICATION OF NEEDS Skin   Limited Assist Verbally Normal                       Personal Care Assistance Level of Assistance  Bathing, Dressing Bathing Assistance: Limited assistance   Dressing Assistance: Limited assistance     Functional Limitations Info             SPECIAL CARE FACTORS FREQUENCY                       Contractures      Additional Factors Info  Code Status, Allergies Code Status Info: DNR Allergies Info: Ciprofloxacin, Combigan Brimonidine Tartrate-timolol, Detrol Tolterodine Tartrate, Oxycodone, Penicillins, Sulfa Antibiotics           Current Medications (01/15/2018):  This is the current hospital active medication list Current Facility-Administered Medications  Medication Dose Route Frequency Provider Last Rate Last Dose  . 0.9 %  sodium chloride infusion   Intravenous Continuous Emokpae, Courage, MD 40 mL/hr at 01/13/18 1230    . 0.9 %  sodium chloride infusion  250 mL Intravenous PRN Purohit, Shrey C, MD      . acetaminophen (TYLENOL) tablet 650 mg  650 mg Oral Q6H PRN Purohit, Shrey C, MD       Or  . acetaminophen (TYLENOL) suppository 650 mg  650 mg Rectal Q6H PRN Purohit, Shrey C, MD      . amLODipine (NORVASC) tablet 5 mg  5 mg Oral Daily Purohit, Salli QuarryShrey C, MD   5  mg at 01/14/18 1014  . azithromycin (ZITHROMAX) 500 mg in sodium chloride 0.9 % 250 mL IVPB  500 mg Intravenous Q24H Emokpae, Courage, MD 250 mL/hr at 01/14/18 1811 500 mg at 01/14/18 1811  . cefTRIAXone (ROCEPHIN) 1 g in sodium chloride 0.9 % 100 mL IVPB  1 g Intravenous Q24H Emokpae, Courage, MD 200 mL/hr at 01/14/18 1720 1 g at 01/14/18 1720  . citalopram (CELEXA) tablet 5 mg  5 mg Oral Daily Purohit, Shrey C, MD   5 mg at 01/14/18 1014  . dipyridamole-aspirin (AGGRENOX) 200-25 MG per 12 hr capsule 1 capsule  1 capsule Oral BID Purohit, Salli QuarryShrey C, MD   1 capsule at 01/14/18 2215  . donepezil (ARICEPT) tablet 10 mg  10 mg  Oral QHS Purohit, Shrey C, MD   10 mg at 01/14/18 2215  . enoxaparin (LOVENOX) injection 40 mg  40 mg Subcutaneous Q24H Purohit, Shrey C, MD   40 mg at 01/12/18 1900  . gabapentin (NEURONTIN) capsule 100 mg  100 mg Oral QHS Purohit, Shrey C, MD   100 mg at 01/14/18 2215  . ipratropium-albuterol (DUONEB) 0.5-2.5 (3) MG/3ML nebulizer solution 3 mL  3 mL Nebulization Q6H PRN Purohit, Shrey C, MD      . latanoprost (XALATAN) 0.005 % ophthalmic solution 1 drop  1 drop Both Eyes QHS Purohit, Shrey C, MD   1 drop at 01/12/18 2244  . levothyroxine (SYNTHROID, LEVOTHROID) tablet 37.5 mcg  37.5 mcg Oral QAC breakfast Purohit, Shrey C, MD   37.5 mcg at 01/15/18 0802  . loperamide (IMODIUM) capsule 2 mg  2 mg Oral Q6H PRN Purohit, Shrey C, MD      . MUSCLE RUB CREA   Topical Q12H PRN Purohit, Shrey C, MD      . ondansetron (ZOFRAN) tablet 4 mg  4 mg Oral Q6H PRN Purohit, Shrey C, MD       Or  . ondansetron (ZOFRAN) injection 4 mg  4 mg Intravenous Q6H PRN Purohit, Shrey C, MD      . polyethylene glycol (MIRALAX / GLYCOLAX) packet 17 g  17 g Oral Daily PRN Purohit, Shrey C, MD      . sodium chloride flush (NS) 0.9 % injection 3 mL  3 mL Intravenous Q12H Purohit, Shrey C, MD   3 mL at 01/14/18 1015  . sodium chloride flush (NS) 0.9 % injection 3 mL  3 mL Intravenous PRN Purohit, Shrey C, MD      . timolol (TIMOPTIC-XR) 0.5 % ophthalmic gel-forming 1 drop  1 drop Both Eyes Daily Purohit, Salli QuarryShrey C, MD   1 drop at 01/14/18 1015     Discharge Medications: Please see discharge summary for a list of discharge medications.  Relevant Imaging Results:  Relevant Lab Results:   Additional Information SS#: [id number]  Burna SisUris, Cameron Katayama H, LCSW

## 2018-01-15 NOTE — Discharge Summary (Signed)
Darlene Wilson, is a 84 y.o. female  DOB 1927/05/07  MRN [id number].  Admission date:  01/12/2018  Admitting Physician  Delaine Lame, MD  Discharge Date:  01/15/2018   Primary MD  [name], MD  Recommendations for primary care physician for things to follow:   1) fall precautions 2)Take medications as prescribed 3) follow-up with your primary doctor within 1 week for recheck   Admission Diagnosis  Shortness of breath [R06.02] Acute cystitis without hematuria [N30.00] HCAP (healthcare-associated pneumonia) [J18.9]   Discharge Diagnosis  Shortness of breath [R06.02] Acute cystitis without hematuria [N30.00] HCAP (healthcare-associated pneumonia) [J18.9]    Principal Problem:   HCAP (healthcare-associated pneumonia) Active Problems:   Hypertension   Stroke [facility])   Thyroid disease   Memory loss   Hemiplegia of dominant side as late effect following cerebrovascular disease (HCC)   Depression with anxiety   Alzheimer disease     Past Medical History:  Diagnosis Date  . Chronic kidney disease 1998   stones  . Depression with anxiety 09/14/2014  . Dyspnea   . Fibrocystic breast 01/12/2011  . Glaucoma    both eyes  . History of chicken pox   . Hypertension   . Insomnia 01/13/2011  . Kidney stone    stones   . Memory loss 10/12/2012  . Nausea 05/31/2012  . Spinal stenosis 06/14/2011  . Stroke (HCC) 04/2007   x 3 total  . Thyroid disease    hypothyroid  . Urinary frequency 02/09/2011  . Vertigo 02/09/2011    Past Surgical History:  Procedure Laterality Date  . ABDOMINAL HYSTERECTOMY  1963  . BACK SURGERY  1989   discectomy L4-5 1989, good results  . BREAST SURGERY  1970   biopsy, benign, b/l       HPI  from the history and physical done on the day of admission:    84 y.o. female with medical history significant of dementia, CVA with deficits, chronic pain,  hypothyroidism, hypertension who comes in from nursing home with shortness of breath.  History is limited by patient's dementia.  Patient was apparently nursing home and became progressively short of breath and EMS was called.  At the time her vitals were reportedly stable however she was noted to be wheezing and she was given a bronchodilator with improvement in symptoms.  Patient is an unreliable historian but apparently she reports that she has had intermittent episodes of shortness of breath and difficulty catching her breath.  She does not have any significant dyspnea on exertion.  She did not have any chest pain, nausea, vomiting, diarrhea, cough, congestion, rhinorrhea.  She has not had any fevers or productive sputum.  Per discussion with the patient's daughter-in-law who is the caregiver the patient often has some shortness of breath and then becomes anxious that subsequent exacerbates her shortness of breath.  ED Course: In the ED patient's vitals were notable for tachypnea.  Troponins were negative.  Labs are notable for a normal lactate there is an increase  to 2.75 after some IV fluids.  Chest x-ray was normal.  CTA for PE was negative for PE but did show possible small pneumonia.  Blood cultures were taken and antibiotics were given for presumed H CAP.     Hospital Course:     Brief Summary 84 y.o.femalewith medical history significant ofdementia, CVA with deficits, chronic pain, hypothyroidism, hypertension admitted on 01/12/2018 from nursing home with concerns about right-sided pneumonia and dyspnea with a lactic acid  Plan:- 1)Possible HCAP--- overall much improved respiratory symptoms, no hypoxia even post ambulation, able to speak in sentences, treated with  Rocephin and azithromycin, blood cultures negative so far, continue bronchodilators and mucolytics,  discharged with Omnicef and azithromycin  2) dementia with behavioral disturbance--- she is cooperative, patient with  significant cognitive deficits, overall appears less confused , No agitation continue Aricept and Celexa  3)HTN--- stable, continue amlodipine 5 mg daily  4)H/o Prior CVA--continue Aggrenox for secondary stroke prophylaxis, it is not clear why patient is not on a statin drug, patient should have this addressed with PCP  Code Status : DNR   Disposition Plan  : Discharge back to living facility on Monday, 01/15/2018    Discharge Condition: Stable, O2 sats on room air 95 to 97%  Follow UP - PCP within a week  Diet and Activity recommendation:  As advised  Discharge Instructions    Discharge Instructions    Call MD for:  difficulty breathing, headache or visual disturbances   Complete by:  As directed    Call MD for:  persistant dizziness or light-headedness   Complete by:  As directed    Call MD for:  persistant nausea and vomiting   Complete by:  As directed    Call MD for:  severe uncontrolled pain   Complete by:  As directed    Call MD for:  temperature >100.4   Complete by:  As directed    Diet - low sodium heart healthy   Complete by:  As directed    Discharge instructions   Complete by:  As directed    1) fall precautions 2)Take medications as prescribed 3) follow-up with your primary doctor within 1 week for recheck   Increase activity slowly   Complete by:  As directed        Discharge Medications     Allergies as of 01/15/2018      Reactions   Ciprofloxacin Hives   Given with 2 other meds/unsure if truly allergic   Combigan [brimonidine Tartrate-timolol] Other (See Comments)   Inflamation   Detrol [tolterodine Tartrate] Hives   Given with 2 other medications/unsure if truly allergic   Oxycodone Hives   Given with 2 other medications/unsure if truly allergic   Penicillins Hives, Itching, Rash   Sulfa Antibiotics Hives, Itching, Rash      Medication List    STOP taking these medications   lisinopril 10 MG tablet Commonly known as:   PRINIVIL,ZESTRIL     TAKE these medications   acetaminophen 325 MG tablet Commonly known as:  TYLENOL Take 2 tablets (650 mg total) by mouth every 6 (six) hours as needed. What changed:  when to take this   albuterol 108 (90 Base) MCG/ACT inhaler Commonly known as:  PROVENTIL HFA;VENTOLIN HFA Inhale 2 puffs into the lungs every 4 (four) hours as needed for wheezing or shortness of breath.   amLODipine 5 MG tablet Commonly known as:  NORVASC Take 1 tablet (5 mg total) by mouth daily.  azithromycin 500 MG tablet Commonly known as:  ZITHROMAX Take 1 tablet (500 mg total) by mouth daily for 3 days.   betaxolol 0.5 % ophthalmic suspension Commonly known as:  BETOPTIC-S Place 1 drop into both eyes 2 (two) times daily.   BIOFREEZE 4 % Gel Generic drug:  Menthol (Topical Analgesic) Apply 1 application topically every 12 (twelve) hours as needed (for right shoulder pain).   cefdinir 300 MG capsule Commonly known as:  OMNICEF Take 1 capsule (300 mg total) by mouth 2 (two) times daily for 7 days.   citalopram 10 MG tablet Commonly known as:  CELEXA Take 1 tablet (10 mg total) by mouth daily. What changed:  how much to take   dipyridamole-aspirin 200-25 MG 12hr capsule Commonly known as:  AGGRENOX TAKE 1 CAPSULE BY MOUTH 2 (TWO) TIMES DAILY. What changed:    how much to take  how to take this  when to take this  additional instructions   donepezil 10 MG tablet Commonly known as:  ARICEPT Take 1 tablet (10 mg total) by mouth at bedtime.   gabapentin 100 MG capsule Commonly known as:  NEURONTIN TAKE 1 CAPSULE (100 MG TOTAL) BY MOUTH AT BEDTIME. What changed:    how much to take  how to take this  when to take this  additional instructions   guaiFENesin 600 MG 12 hr tablet Commonly known as:  MUCINEX Take 1 tablet (600 mg total) by mouth 2 (two) times daily for 10 days.   IMODIUM A-D 2 MG tablet Generic drug:  loperamide Take 2 mg by mouth every 6 (six)  hours as needed for diarrhea or loose stools.   latanoprost 0.005 % ophthalmic solution Commonly known as:  XALATAN Place 1 drop into both eyes at bedtime.   levothyroxine 75 MCG tablet Commonly known as:  SYNTHROID, LEVOTHROID Take 0.5 tablets (37.5 mcg total) by mouth daily.   predniSONE 20 MG tablet Commonly known as:  DELTASONE Take 1 tablet (20 mg total) by mouth daily with breakfast.   timolol 0.5 % ophthalmic solution Commonly known as:  BETIMOL Place 1 drop into both eyes every morning.   ZOFRAN 4 MG tablet Generic drug:  ondansetron Take 4 mg by mouth every 8 (eight) hours as needed for nausea or vomiting.      Major procedures and Radiology Reports - PLEASE review detailed and final reports for all details, in brief -   Ct Angio Chest Pe W/cm &/or Wo Cm  Result Date: 01/12/2018 CLINICAL DATA:  Chest pressure and shortness of breath since this morning. EXAM: CT ANGIOGRAPHY CHEST WITH CONTRAST TECHNIQUE: Multidetector CT imaging of the chest was performed using the standard protocol during bolus administration of intravenous contrast. Multiplanar CT image reconstructions and MIPs were obtained to evaluate the vascular anatomy. CONTRAST:  ISOVUE-370 IOPAMIDOL (ISOVUE-370) INJECTION 76% COMPARISON:  10/21/2014 FINDINGS: Cardiovascular: Heart is normal size. Mild calcified plaque over the thoracic aorta. Calcified plaque over the left anterior descending coronary artery. Pulmonary arterial system is normal without emboli. Mediastinum/Nodes: No mediastinal or hilar adenopathy. Remaining mediastinal structures are within normal. Lungs/Pleura: Lungs are adequately inflated demonstrate subtle peripheral reticulonodular opacification over the posterolateral right upper lobe. Minimal atelectasis in the right lower lobe adjacent elevated hemidiaphragm. No evidence of effusion. Airways are within normal. Upper Abdomen: No acute findings. Possible punctate nonobstructing stone over the  mid pole left kidney. Calcified plaque over the abdominal aorta. Musculoskeletal: Mild degenerate change of the spine. Review of the MIP images  confirms the above findings. IMPRESSION: No evidence of pulmonary embolism. Subtle focal reticulonodular opacification over the posterolateral right upper lobe which may be due to focal infectious or inflammatory process. Recommend follow-up CT 4-6 weeks. Minimal atherosclerotic coronary artery disease. Aortic Atherosclerosis (ICD10-I70.0). Nonobstructing punctate left renal stone Electronically Signed   By: Elberta Fortisaniel  Boyle M.D.   On: 01/12/2018 13:25   Dg Chest Port 1 View  Result Date: 01/12/2018 CLINICAL DATA:  Shortness of breath and chest pain. EXAM: PORTABLE CHEST 1 VIEW COMPARISON:  07/26/2017 FINDINGS: Lungs are adequately inflated without airspace consolidation or effusion. Cardiomediastinal silhouette and remainder of the exam is unchanged. IMPRESSION: No active disease. Electronically Signed   By: Elberta Fortisaniel  Boyle M.D.   On: 01/12/2018 09:02   Micro Results    Recent Results (from the past 240 hour(s))  Culture, blood (Routine X 2) w Reflex to ID Panel     Status: None (Preliminary result)   Collection Time: 01/12/18  9:10 AM  Result Value Ref Range Status   Specimen Description BLOOD RIGHT ANTECUBITAL  Final   Special Requests   Final    BOTTLES DRAWN AEROBIC AND ANAEROBIC Blood Culture adequate volume   Culture   Final    NO GROWTH 2 DAYS Performed at [facility], [address] [place], [place] [address]    Report Status PENDING  Incomplete  Culture, blood (Routine X 2) w Reflex to ID Panel     Status: None (Preliminary result)   Collection Time: 01/12/18  9:15 AM  Result Value Ref Range Status   Specimen Description BLOOD RIGHT FOREARM  Final   Special Requests   Final    BOTTLES DRAWN AEROBIC AND ANAEROBIC Blood Culture adequate volume   Culture   Final    NO GROWTH 2 DAYS Performed at [facility], [address]  [place], [place] [address]    Report Status PENDING  Incomplete  Urine Culture     Status: Abnormal   Collection Time: 01/12/18 12:07 PM  Result Value Ref Range Status   Specimen Description URINE, CLEAN CATCH  Final   Special Requests   Final    NONE Performed at [facility], [address] [place], [place] [address]    Culture MULTIPLE SPECIES PRESENT, SUGGEST RECOLLECTION (A)  Final   Report Status 01/13/2018 FINAL  Final    Today   Subjective    Dyann RuddleJanie Feick today has no new concerns, post ambulation O2 sats over 95%, cough is much better, able to speak in sentences, she is cooperative no agitation   Patient has been seen and examined prior to discharge   Objective   Blood pressure 138/69, pulse 80, temperature 98.4 F (36.9 C), temperature source Oral, resp. rate 18, height 5\' 5"  (1.651 m), weight 63.5 kg, SpO2 100 %.   Intake/Output Summary (Last 24 hours) at 01/15/2018 0912 Last data filed at 01/15/2018 0800 Gross per 24 hour  Intake 1643.5 ml  Output 1875 ml  Net -231.5 ml    Exam Gen:- Awake Alert, less confused HEENT:- [place].AT, No sclera icterus Neck-Supple Neck,No JVD,.  Lungs-improved air movement bilaterally, no rales, no wheezing CV- S1, S2 normal Abd-  +ve B.Sounds, Abd Soft, No tenderness,    Extremity/Skin:- No  edema,   , good pulses Psych-obvious cognitive deficits, but overall cooperative, no agitation Neuro-no new focal deficits, no tremors   Data Review   CBC w Diff:  Lab Results  Component Value Date  WBC 8.6 01/12/2018   HGB 14.3 01/12/2018   HCT 42.0 01/12/2018   PLT 174 01/12/2018   LYMPHOPCT 9 07/26/2017   MONOPCT 9 07/26/2017   EOSPCT 0 07/26/2017   BASOPCT 0 07/26/2017    CMP:  Lab Results  Component Value Date   NA 141 01/12/2018   K 4.3 01/12/2018   CL 104 01/12/2018   CO2 27 01/12/2018   BUN 22 01/12/2018   CREATININE 0.70 01/12/2018   CREATININE 0.83 10/03/2013   PROT 6.1 (L) 01/12/2018   ALBUMIN 3.3 (L)  01/12/2018   BILITOT 0.7 01/12/2018   ALKPHOS 157 (H) 01/12/2018   AST 24 01/12/2018   ALT 16 01/12/2018  .   Total Discharge time is about 33 minutes  Shon Hale M.D on 01/15/2018 at 9:12 AM   Go to [email or web address] - password [email or web address] for contact info  [facility] - Office  [phone]

## 2018-01-16 ENCOUNTER — Telehealth: Payer: Self-pay

## 2018-01-16 DIAGNOSIS — F331 Major depressive disorder, recurrent, moderate: Secondary | ICD-10-CM | POA: Diagnosis not present

## 2018-01-16 DIAGNOSIS — E039 Hypothyroidism, unspecified: Secondary | ICD-10-CM | POA: Diagnosis not present

## 2018-01-16 DIAGNOSIS — Z79899 Other long term (current) drug therapy: Secondary | ICD-10-CM | POA: Diagnosis not present

## 2018-01-16 DIAGNOSIS — H918X3 Other specified hearing loss, bilateral: Secondary | ICD-10-CM | POA: Diagnosis not present

## 2018-01-16 DIAGNOSIS — J188 Other pneumonia, unspecified organism: Secondary | ICD-10-CM | POA: Diagnosis not present

## 2018-01-16 NOTE — Telephone Encounter (Signed)
Called patient/family to schedule Hospital follow up visit. Daughter states patient is currently in an Assisted Living Facility and will be followed from this point by the Physician for that facility.

## 2018-01-17 LAB — CULTURE, BLOOD (ROUTINE X 2)
CULTURE: NO GROWTH
Culture: NO GROWTH
SPECIAL REQUESTS: ADEQUATE
SPECIAL REQUESTS: ADEQUATE

## 2018-01-22 ENCOUNTER — Encounter: Payer: Self-pay | Admitting: Family Medicine

## 2018-01-22 DIAGNOSIS — F0150 Vascular dementia without behavioral disturbance: Secondary | ICD-10-CM | POA: Diagnosis not present

## 2018-01-23 ENCOUNTER — Other Ambulatory Visit: Payer: Self-pay

## 2018-01-23 MED ORDER — LATANOPROST 0.005 % OP SOLN: 1.0000 [drp] | Freq: Every day | OPHTHALMIC | 0 refills | Status: AC

## 2018-01-23 MED ORDER — DONEPEZIL HCL 10 MG PO TABS: 10.0000 mg | ORAL_TABLET | Freq: Every day | ORAL | 1 refills | Status: AC

## 2018-01-23 MED ORDER — BETAXOLOL HCL 0.5 % OP SOLN: 1.0000 [drp] | Freq: Two times a day (BID) | OPHTHALMIC | 12 refills | Status: AC

## 2018-01-27 DIAGNOSIS — E039 Hypothyroidism, unspecified: Secondary | ICD-10-CM | POA: Diagnosis not present

## 2018-01-27 DIAGNOSIS — I10 Essential (primary) hypertension: Secondary | ICD-10-CM | POA: Diagnosis not present

## 2018-01-27 DIAGNOSIS — F0150 Vascular dementia without behavioral disturbance: Secondary | ICD-10-CM | POA: Diagnosis not present

## 2018-02-06 DIAGNOSIS — F0150 Vascular dementia without behavioral disturbance: Secondary | ICD-10-CM | POA: Diagnosis not present

## 2018-02-06 DIAGNOSIS — Z79899 Other long term (current) drug therapy: Secondary | ICD-10-CM | POA: Diagnosis not present

## 2018-02-06 DIAGNOSIS — E039 Hypothyroidism, unspecified: Secondary | ICD-10-CM | POA: Diagnosis not present

## 2018-02-06 DIAGNOSIS — H409 Unspecified glaucoma: Secondary | ICD-10-CM | POA: Diagnosis not present

## 2018-02-06 DIAGNOSIS — I129 Hypertensive chronic kidney disease with stage 1 through stage 4 chronic kidney disease, or unspecified chronic kidney disease: Secondary | ICD-10-CM | POA: Diagnosis not present

## 2018-02-26 DIAGNOSIS — F0150 Vascular dementia without behavioral disturbance: Secondary | ICD-10-CM | POA: Diagnosis not present

## 2018-04-23 DIAGNOSIS — N390 Urinary tract infection, site not specified: Secondary | ICD-10-CM | POA: Diagnosis not present

## 2018-04-25 DIAGNOSIS — N390 Urinary tract infection, site not specified: Secondary | ICD-10-CM | POA: Diagnosis not present

## 2018-05-03 DIAGNOSIS — F0391 Unspecified dementia with behavioral disturbance: Secondary | ICD-10-CM | POA: Diagnosis not present

## 2018-05-11 DIAGNOSIS — R05 Cough: Secondary | ICD-10-CM | POA: Diagnosis not present

## 2018-05-11 DIAGNOSIS — R0989 Other specified symptoms and signs involving the circulatory and respiratory systems: Secondary | ICD-10-CM | POA: Diagnosis not present

## 2018-05-24 ENCOUNTER — Other Ambulatory Visit: Payer: Self-pay

## 2018-05-24 ENCOUNTER — Inpatient Hospital Stay (HOSPITAL_COMMUNITY)
Admission: EM | Admit: 2018-05-24 | Discharge: 2018-05-30 | DRG: 194 | Disposition: A | Discharge status: Home Health | Payer: Medicare Other | Attending: Family Medicine | Admitting: Family Medicine

## 2018-05-24 ENCOUNTER — Encounter (HOSPITAL_COMMUNITY): Payer: Self-pay | Admitting: *Deleted

## 2018-05-24 ENCOUNTER — Emergency Department (HOSPITAL_COMMUNITY): Payer: Medicare Other

## 2018-05-24 ENCOUNTER — Inpatient Hospital Stay (HOSPITAL_COMMUNITY): Payer: Medicare Other

## 2018-05-24 DIAGNOSIS — Z79899 Other long term (current) drug therapy: Secondary | ICD-10-CM

## 2018-05-24 DIAGNOSIS — E039 Hypothyroidism, unspecified: Secondary | ICD-10-CM | POA: Diagnosis present

## 2018-05-24 DIAGNOSIS — I959 Hypotension, unspecified: Secondary | ICD-10-CM | POA: Diagnosis not present

## 2018-05-24 DIAGNOSIS — J181 Lobar pneumonia, unspecified organism: Secondary | ICD-10-CM | POA: Diagnosis not present

## 2018-05-24 DIAGNOSIS — I639 Cerebral infarction, unspecified: Secondary | ICD-10-CM | POA: Diagnosis present

## 2018-05-24 DIAGNOSIS — F418 Other specified anxiety disorders: Secondary | ICD-10-CM | POA: Diagnosis present

## 2018-05-24 DIAGNOSIS — Z833 Family history of diabetes mellitus: Secondary | ICD-10-CM | POA: Diagnosis not present

## 2018-05-24 DIAGNOSIS — F0390 Unspecified dementia without behavioral disturbance: Secondary | ICD-10-CM | POA: Diagnosis not present

## 2018-05-24 DIAGNOSIS — J189 Pneumonia, unspecified organism: Secondary | ICD-10-CM | POA: Diagnosis not present

## 2018-05-24 DIAGNOSIS — Z818 Family history of other mental and behavioral disorders: Secondary | ICD-10-CM

## 2018-05-24 DIAGNOSIS — R1310 Dysphagia, unspecified: Secondary | ICD-10-CM | POA: Diagnosis present

## 2018-05-24 DIAGNOSIS — Z79890 Hormone replacement therapy (postmenopausal): Secondary | ICD-10-CM

## 2018-05-24 DIAGNOSIS — H409 Unspecified glaucoma: Secondary | ICD-10-CM | POA: Diagnosis present

## 2018-05-24 DIAGNOSIS — I69959 Hemiplegia and hemiparesis following unspecified cerebrovascular disease affecting unspecified side: Secondary | ICD-10-CM

## 2018-05-24 DIAGNOSIS — E876 Hypokalemia: Secondary | ICD-10-CM | POA: Diagnosis present

## 2018-05-24 DIAGNOSIS — Z7902 Long term (current) use of antithrombotics/antiplatelets: Secondary | ICD-10-CM | POA: Diagnosis not present

## 2018-05-24 DIAGNOSIS — F0281 Dementia in other diseases classified elsewhere with behavioral disturbance: Secondary | ICD-10-CM | POA: Diagnosis not present

## 2018-05-24 DIAGNOSIS — Z881 Allergy status to other antibiotic agents status: Secondary | ICD-10-CM | POA: Diagnosis not present

## 2018-05-24 DIAGNOSIS — I69352 Hemiplegia and hemiparesis following cerebral infarction affecting left dominant side: Secondary | ICD-10-CM

## 2018-05-24 DIAGNOSIS — Z885 Allergy status to narcotic agent status: Secondary | ICD-10-CM

## 2018-05-24 DIAGNOSIS — G309 Alzheimer's disease, unspecified: Secondary | ICD-10-CM | POA: Diagnosis present

## 2018-05-24 DIAGNOSIS — F0280 Dementia in other diseases classified elsewhere without behavioral disturbance: Secondary | ICD-10-CM | POA: Diagnosis present

## 2018-05-24 DIAGNOSIS — G301 Alzheimer's disease with late onset: Secondary | ICD-10-CM | POA: Diagnosis not present

## 2018-05-24 DIAGNOSIS — Z90710 Acquired absence of both cervix and uterus: Secondary | ICD-10-CM

## 2018-05-24 DIAGNOSIS — I700 Atherosclerosis of aorta: Secondary | ICD-10-CM | POA: Diagnosis not present

## 2018-05-24 DIAGNOSIS — Z8249 Family history of ischemic heart disease and other diseases of the circulatory system: Secondary | ICD-10-CM

## 2018-05-24 DIAGNOSIS — R0902 Hypoxemia: Secondary | ICD-10-CM | POA: Diagnosis not present

## 2018-05-24 DIAGNOSIS — Z803 Family history of malignant neoplasm of breast: Secondary | ICD-10-CM

## 2018-05-24 DIAGNOSIS — Z8701 Personal history of pneumonia (recurrent): Secondary | ICD-10-CM | POA: Diagnosis not present

## 2018-05-24 DIAGNOSIS — Z87442 Personal history of urinary calculi: Secondary | ICD-10-CM | POA: Diagnosis not present

## 2018-05-24 DIAGNOSIS — Z880 Allergy status to penicillin: Secondary | ICD-10-CM | POA: Diagnosis not present

## 2018-05-24 DIAGNOSIS — I10 Essential (primary) hypertension: Secondary | ICD-10-CM | POA: Diagnosis not present

## 2018-05-24 DIAGNOSIS — G4700 Insomnia, unspecified: Secondary | ICD-10-CM | POA: Diagnosis present

## 2018-05-24 DIAGNOSIS — R5381 Other malaise: Secondary | ICD-10-CM | POA: Diagnosis not present

## 2018-05-24 DIAGNOSIS — Z882 Allergy status to sulfonamides status: Secondary | ICD-10-CM

## 2018-05-24 DIAGNOSIS — Z888 Allergy status to other drugs, medicaments and biological substances status: Secondary | ICD-10-CM

## 2018-05-24 HISTORY — DX: Atherosclerosis of aorta: I70.0

## 2018-05-24 LAB — BASIC METABOLIC PANEL
ANION GAP: 9 (ref 5–15)
BUN: 24 mg/dL — ABNORMAL HIGH (ref 8–23)
CALCIUM: 8.2 mg/dL — AB (ref 8.9–10.3)
CO2: 24 mmol/L (ref 22–32)
CREATININE: 0.94 mg/dL (ref 0.44–1.00)
Chloride: 107 mmol/L (ref 98–111)
GFR, EST NON AFRICAN AMERICAN: 54 mL/min — AB (ref >60)
Glucose, Bld: 96 mg/dL (ref 70–99)
Potassium: 3.5 mmol/L (ref 3.5–5.1)
SODIUM: 140 mmol/L (ref 135–145)

## 2018-05-24 LAB — URINALYSIS, ROUTINE W REFLEX MICROSCOPIC
Bilirubin Urine: NEGATIVE
Glucose, UA: NEGATIVE mg/dL
Hgb urine dipstick: NEGATIVE
KETONES UR: 5 mg/dL — AB
Leukocytes, UA: NEGATIVE
Nitrite: NEGATIVE
Protein, ur: NEGATIVE mg/dL
Specific Gravity, Urine: 1.016 (ref 1.005–1.030)
pH: 7 (ref 5.0–8.0)

## 2018-05-24 LAB — CBC WITH DIFFERENTIAL/PLATELET
Abs Immature Granulocytes: 0.16 10*3/uL — ABNORMAL HIGH (ref 0.00–0.07)
BASOS ABS: 0 10*3/uL (ref 0.0–0.1)
Basophils Relative: 0 %
EOS ABS: 0 10*3/uL (ref 0.0–0.5)
Eosinophils Relative: 0 %
HEMATOCRIT: 39.4 % (ref 36.0–46.0)
HEMOGLOBIN: 12.5 g/dL (ref 12.0–15.0)
Immature Granulocytes: 2 %
LYMPHS ABS: 1.3 10*3/uL (ref 0.7–4.0)
Lymphocytes Relative: 12 %
MCH: 31.7 pg (ref 26.0–34.0)
MCHC: 31.7 g/dL (ref 30.0–36.0)
MCV: 100 fL (ref 80.0–100.0)
Monocytes Absolute: 1.3 10*3/uL — ABNORMAL HIGH (ref 0.1–1.0)
Monocytes Relative: 12 %
NEUTROS PCT: 74 %
NRBC: 0 % (ref 0.0–0.2)
Neutro Abs: 8 10*3/uL — ABNORMAL HIGH (ref 1.7–7.7)
Platelets: 266 10*3/uL (ref 150–400)
RBC: 3.94 MIL/uL (ref 3.87–5.11)
RDW: 15.3 % (ref 11.5–15.5)
WBC: 10.8 10*3/uL — AB (ref 4.0–10.5)

## 2018-05-24 LAB — INFLUENZA PANEL BY PCR (TYPE A & B)
INFLAPCR: NEGATIVE
INFLBPCR: NEGATIVE

## 2018-05-24 LAB — I-STAT CG4 LACTIC ACID, ED: LACTIC ACID, VENOUS: 1.14 mmol/L (ref 0.5–1.9)

## 2018-05-24 LAB — MRSA PCR SCREENING: MRSA by PCR: NEGATIVE

## 2018-05-24 MED ORDER — GUAIFENESIN ER 600 MG PO TB12
600.0000 mg | ORAL_TABLET | Freq: Two times a day (BID) | ORAL | Status: DC
  Administered 2018-05-24 – 2018-05-30 (×11): 600 mg via ORAL
  Filled 2018-05-24 (×12): qty 1

## 2018-05-24 MED ORDER — IOPAMIDOL (ISOVUE-370) INJECTION 76%
100.0000 mL | Freq: Once | INTRAVENOUS | Status: AC | PRN
  Administered 2018-05-24: 100 mL via INTRAVENOUS

## 2018-05-24 MED ORDER — VANCOMYCIN HCL IN DEXTROSE 1-5 GM/200ML-% IV SOLN
1000.0000 mg | INTRAVENOUS | Status: DC
  Administered 2018-05-24: 1000 mg via INTRAVENOUS
  Filled 2018-05-24: qty 200

## 2018-05-24 MED ORDER — SODIUM CHLORIDE 0.9 % IV BOLUS
1000.0000 mL | Freq: Once | INTRAVENOUS | Status: AC
  Administered 2018-05-24: 1000 mL via INTRAVENOUS

## 2018-05-24 MED ORDER — ASPIRIN-DIPYRIDAMOLE ER 25-200 MG PO CP12
1.0000 | ORAL_CAPSULE | Freq: Two times a day (BID) | ORAL | Status: DC
  Administered 2018-05-24 – 2018-05-30 (×11): 1 via ORAL
  Filled 2018-05-24 (×12): qty 1

## 2018-05-24 MED ORDER — DONEPEZIL HCL 5 MG PO TABS
10.0000 mg | ORAL_TABLET | Freq: Every day | ORAL | Status: DC
  Administered 2018-05-24 – 2018-05-29 (×5): 10 mg via ORAL
  Filled 2018-05-24 (×6): qty 2

## 2018-05-24 MED ORDER — ENOXAPARIN SODIUM 40 MG/0.4ML ~~LOC~~ SOLN
40.0000 mg | SUBCUTANEOUS | Status: DC
  Administered 2018-05-24 – 2018-05-29 (×5): 40 mg via SUBCUTANEOUS
  Filled 2018-05-24 (×6): qty 0.4

## 2018-05-24 MED ORDER — LATANOPROST 0.005 % OP SOLN
1.0000 [drp] | Freq: Every day | OPHTHALMIC | Status: DC
  Administered 2018-05-24 – 2018-05-29 (×6): 1 [drp] via OPHTHALMIC
  Filled 2018-05-24: qty 2.5

## 2018-05-24 MED ORDER — IOPAMIDOL (ISOVUE-370) INJECTION 76%
INTRAVENOUS | Status: AC
  Filled 2018-05-24: qty 100

## 2018-05-24 MED ORDER — CITALOPRAM HYDROBROMIDE 10 MG PO TABS
10.0000 mg | ORAL_TABLET | Freq: Every day | ORAL | Status: DC
  Administered 2018-05-25 – 2018-05-30 (×6): 10 mg via ORAL
  Filled 2018-05-24 (×6): qty 1

## 2018-05-24 MED ORDER — OLANZAPINE 5 MG PO TABS
2.5000 mg | ORAL_TABLET | Freq: Every evening | ORAL | Status: DC
  Administered 2018-05-26 – 2018-05-29 (×4): 2.5 mg via ORAL
  Filled 2018-05-24 (×4): qty 1

## 2018-05-24 MED ORDER — METRONIDAZOLE IN NACL 5-0.79 MG/ML-% IV SOLN
500.0000 mg | Freq: Three times a day (TID) | INTRAVENOUS | Status: DC
  Administered 2018-05-24 – 2018-05-25 (×2): 500 mg via INTRAVENOUS
  Filled 2018-05-24 (×2): qty 100

## 2018-05-24 MED ORDER — BETAXOLOL HCL 0.5 % OP SOLN
1.0000 [drp] | Freq: Two times a day (BID) | OPHTHALMIC | Status: DC
  Administered 2018-05-24 – 2018-05-30 (×12): 1 [drp] via OPHTHALMIC
  Filled 2018-05-24: qty 5

## 2018-05-24 MED ORDER — SODIUM CHLORIDE 0.9 % IV SOLN
INTRAVENOUS | Status: DC
  Administered 2018-05-24 – 2018-05-29 (×8): via INTRAVENOUS

## 2018-05-24 MED ORDER — GABAPENTIN 100 MG PO CAPS
100.0000 mg | ORAL_CAPSULE | Freq: Every day | ORAL | Status: DC
  Administered 2018-05-24 – 2018-05-29 (×5): 100 mg via ORAL
  Filled 2018-05-24 (×6): qty 1

## 2018-05-24 MED ORDER — SODIUM CHLORIDE (PF) 0.9 % IJ SOLN
INTRAMUSCULAR | Status: AC
  Filled 2018-05-24: qty 50

## 2018-05-24 MED ORDER — LEVOTHYROXINE SODIUM 75 MCG PO TABS
37.5000 ug | ORAL_TABLET | Freq: Every day | ORAL | Status: DC
  Administered 2018-05-25 – 2018-05-30 (×6): 37.5 ug via ORAL
  Filled 2018-05-24 (×6): qty 1

## 2018-05-24 MED ORDER — SODIUM CHLORIDE 0.9 % IV SOLN
1.0000 g | Freq: Once | INTRAVENOUS | Status: AC
  Administered 2018-05-24: 1 g via INTRAVENOUS
  Filled 2018-05-24: qty 10

## 2018-05-24 MED ORDER — SODIUM CHLORIDE 0.9 % IV SOLN
2.0000 g | Freq: Three times a day (TID) | INTRAVENOUS | Status: DC
  Administered 2018-05-24: 2 g via INTRAVENOUS
  Filled 2018-05-24 (×2): qty 2

## 2018-05-24 MED ORDER — SODIUM CHLORIDE 0.9 % IV SOLN
1.0000 g | INTRAVENOUS | Status: DC
  Filled 2018-05-24: qty 1

## 2018-05-24 MED ORDER — SODIUM CHLORIDE 0.9 % IV SOLN
500.0000 mg | Freq: Once | INTRAVENOUS | Status: DC
  Administered 2018-05-24: 500 mg via INTRAVENOUS
  Filled 2018-05-24: qty 500

## 2018-05-24 NOTE — ED Triage Notes (Signed)
Transported by [facility]-- staff reports shortness of breath, increased lethargy and fever x "a few days." However patient has no complaints. +hx of dementia, oriented to baseline. VSS wit EMS.

## 2018-05-24 NOTE — H&P (Signed)
[facility] History and Physical   Patient: Darlene Wilson WUJ:[id number]   PCP: Bradd CanaryBlyth, Stacey A, MD DOB: 1927-05-10   DOA: 05/24/2018   DOS: 05/24/2018   DOS: the patient was seen and examined on 05/24/2018  Patient coming from: The patient is coming from SNF memory care unit.  Chief Complaint: Shortness of breath  HPI: Darlene Wilson is a 84 y.o. female with Past medical history of CVA, dementia, hypothyroidism, depression, HTN. Patient resents with complaints of cough and shortness of breath.  Ongoing for last 2 days.  No nausea no vomiting no fever no chills reported.  No diarrhea reported. Family was present at bedside and denies any reports of any aspiration or choking events. Patient denies any complaints of chest pain or abdominal pain.  ED Course: Presents with cough and shortness of breath, rectal temperature was mildly elevated.  X-ray showed possible pneumonia.  Influenza PCR was negative.  Patient was referred for admission after receiving IV antibiotics and fluid.  At her baseline ambulates with support And is independent for most of her ADL; manages her medication on her own.  Review of Systems: as mentioned in the history of present illness.  All other systems reviewed and are negative.  Past Medical History:  Diagnosis Date  . Chronic kidney disease 1998   stones  . Depression with anxiety 09/14/2014  . Dyspnea   . Fibrocystic breast 01/12/2011  . Glaucoma    both eyes  . History of chicken pox   . Hypertension   . Insomnia 01/13/2011  . Kidney stone    stones   . Memory loss 10/12/2012  . Nausea 05/31/2012  . Spinal stenosis 06/14/2011  . Stroke (HCC) 04/2007   x 3 total  . Thyroid disease    hypothyroid  . Urinary frequency 02/09/2011  . Vertigo 02/09/2011   Past Surgical History:  Procedure Laterality Date  . ABDOMINAL HYSTERECTOMY  1963  . BACK SURGERY  1989   discectomy L4-5 1989, good results  . BREAST SURGERY  1970   biopsy, benign, b/l    Social History:  reports that she has never smoked. She has never used smokeless tobacco. She reports that she does not drink alcohol or use drugs.  Allergies  Allergen Reactions  . Ciprofloxacin Hives    Given with 2 other meds/unsure if truly allergic  . Combigan [Brimonidine Tartrate-Timolol] Swelling    Inflammation  . Detrol [Tolterodine Tartrate] Hives    Given with 2 other medications/unsure if truly allergic  . Oxycodone Hives    Given with 2 other medications/unsure if truly allergic  . Penicillins Hives, Itching and Rash    DID THE REACTION INVOLVE: Swelling of the face/tongue/throat, SOB, or low BP? Unknown Sudden or severe rash/hives, skin peeling, or the inside of the mouth or nose? Unknown Did it require medical treatment? Unknown When did it last happen?Unknown If all above answers are "NO", may proceed with cephalosporin use.  . Sulfa Antibiotics Hives, Itching and Rash   Family History  Problem Relation Age of Onset  . Heart disease Mother   . Hypertension Mother   . Diabetes Mother   . Heart disease Father   . Hypertension Father   . Cancer Sister        breast cancer  . Mental illness Brother        service connected     Prior to Admission medications   Medication Sig Start Date End Date Taking? Authorizing Provider  acetaminophen (TYLENOL)  325 MG tablet Take 2 tablets (650 mg total) by mouth every 6 (six) hours as needed. Patient taking differently: Take 650 mg by mouth daily.  11/21/12  Yes Christiane HaSullivan, Corinna L, MD  amLODipine (NORVASC) 5 MG tablet Take 1 tablet (5 mg total) by mouth daily. 01/15/18  Yes Emokpae, Courage, MD  betaxolol (BETOPTIC-S) 0.5 % ophthalmic suspension Place 1 drop into both eyes 2 (two) times daily. 01/23/18  Yes [provider]  citalopram (CELEXA) 10 MG tablet Take 1 tablet (10 mg total) by mouth daily. 01/15/18  Yes Emokpae, Courage, MD  dipyridamole-aspirin (AGGRENOX) 200-25 MG 12hr capsule TAKE 1 CAPSULE BY MOUTH 2  (TWO) TIMES DAILY. Patient taking differently: Take 1 capsule by mouth 2 (two) times daily.  01/15/18  Yes Emokpae, Courage, MD  donepezil (ARICEPT) 10 MG tablet Take 1 tablet (10 mg total) by mouth at bedtime. 01/23/18  Yes [provider]  gabapentin (NEURONTIN) 100 MG capsule TAKE 1 CAPSULE (100 MG TOTAL) BY MOUTH AT BEDTIME. Patient taking differently: Take 100 mg by mouth at bedtime.  01/15/18  Yes Emokpae, Courage, MD  guaiFENesin (MUCINEX) 600 MG 12 hr tablet Take 600 mg by mouth every 12 (twelve) hours.   Yes [provider]  latanoprost (XALATAN) 0.005 % ophthalmic solution Place 1 drop into both eyes at bedtime. 01/23/18  Yes [provider]  levothyroxine (SYNTHROID, LEVOTHROID) 75 MCG tablet Take 0.5 tablets (37.5 mcg total) by mouth daily. 01/15/18  Yes Emokpae, Courage, MD  loperamide (IMODIUM A-D) 2 MG tablet Take 2 mg by mouth every 6 (six) hours as needed for diarrhea or loose stools.    Yes [provider]  OLANZapine (ZYPREXA) 2.5 MG tablet Take 2.5 mg by mouth every evening.   Yes [provider]  albuterol (PROVENTIL HFA;VENTOLIN HFA) 108 (90 Base) MCG/ACT inhaler Inhale 2 puffs into the lungs every 4 (four) hours as needed for wheezing or shortness of breath. Patient not taking: Reported on 05/24/2018 01/15/18   Shon HaleEmokpae, Courage, MD    Physical Exam: Vitals:   05/24/18 1600 05/24/18 1630 05/24/18 1700 05/24/18 1805  BP: (!) 107/58 (!) 123/56 131/61 123/69  Pulse: 79 83 85 89  Resp: (!) 27 20 (!) 29 18  Temp:    98.5 F (36.9 C)  TempSrc:    Oral  SpO2: 97% 98% 99% 98%    General: Alert, Awake and Oriented to Time, Place and Person. Appear in moderate distress, affect appropriate Eyes: PERRL, Conjunctiva normal ENT: Oral Mucosa clear moist. Neck: no JVD, no Abnormal Mass Or lumps Cardiovascular: S1 and S2 Present, no Murmur, Peripheral Pulses Present Respiratory: normal respiratory effort, Bilateral Air entry equal and  Decreased, no use of accessory muscle, bilateral basal, no wheezes Abdomen: Bowel Sound present, Soft and no tenderness, no hernia Skin: no redness, no Rash, no induration Extremities: trace Pedal edema, no calf tenderness Neurologic: Grossly no focal neuro deficit. Bilaterally Equal motor strength  Labs on Admission:  CBC: Recent Labs  Lab 05/24/18 1546  WBC 10.8*  NEUTROABS 8.0*  HGB 12.5  HCT 39.4  MCV 100.0  PLT 266   Basic Metabolic Panel: Recent Labs  Lab 05/24/18 1546  NA 140  K 3.5  CL 107  CO2 24  GLUCOSE 96  BUN 24*  CREATININE 0.94  CALCIUM 8.2*   GFR: CrCl cannot be calculated (Unknown ideal weight.). Liver Function Tests: No results for input(s): AST, ALT, ALKPHOS, BILITOT, PROT, ALBUMIN in the last 168 hours. No results for  input(s): LIPASE, AMYLASE in the last 168 hours. No results for input(s): AMMONIA in the last 168 hours. Coagulation Profile: No results for input(s): INR, PROTIME in the last 168 hours. Cardiac Enzymes: No results for input(s): CKTOTAL, CKMB, CKMBINDEX, TROPONINI in the last 168 hours. BNP (last 3 results) No results for input(s): PROBNP in the last 8760 hours. HbA1C: No results for input(s): HGBA1C in the last 72 hours. CBG: No results for input(s): GLUCAP in the last 168 hours. Lipid Profile: No results for input(s): CHOL, HDL, LDLCALC, TRIG, CHOLHDL, LDLDIRECT in the last 72 hours. Thyroid Function Tests: No results for input(s): TSH, T4TOTAL, FREET4, T3FREE, THYROIDAB in the last 72 hours. Anemia Panel: No results for input(s): VITAMINB12, FOLATE, FERRITIN, TIBC, IRON, RETICCTPCT in the last 72 hours. Urine analysis:    Component Value Date/Time   COLORURINE YELLOW 05/24/2018 1702   APPEARANCEUR CLOUDY (A) 05/24/2018 1702   LABSPEC 1.016 05/24/2018 1702   PHURINE 7.0 05/24/2018 1702   GLUCOSEU NEGATIVE 05/24/2018 1702   HGBUR NEGATIVE 05/24/2018 1702   BILIRUBINUR NEGATIVE 05/24/2018 1702   BILIRUBINUR neg  05/31/2012 1112   KETONESUR 5 (A) 05/24/2018 1702   PROTEINUR NEGATIVE 05/24/2018 1702   UROBILINOGEN 1.0 03/08/2015 1526   NITRITE NEGATIVE 05/24/2018 1702   LEUKOCYTESUR NEGATIVE 05/24/2018 1702    Radiological Exams on Admission: Dg Chest 2 View  Result Date: 05/24/2018 CLINICAL DATA:  Dyspnea with increasing lethargy and fever for a few days. EXAM: CHEST - 2 VIEW COMPARISON:  None. FINDINGS: Normal heart size and mediastinal contours with aortic atherosclerosis. Slight uncoiling of the thoracic aorta is identified. Patchy pulmonary consolidation in the right upper lobe distribution is noted consistent with pneumonia. Left lung appears clear. No acute osseous abnormality is noted. IMPRESSION: Right upper lobe pneumonia. Followup PA and lateral chest X-ray is recommended in 3-4 weeks following trial of antibiotic therapy to ensure resolution and exclude underlying malignancy. Electronically Signed   By: Tollie Eth M.D.   On: 05/24/2018 14:53   EKG: Independently reviewed. normal sinus rhythm, nonspecific ST and T waves changes.  Assessment/Plan Recurrent right-sided pneumonia. Patient presents with complaints of cough and shortness of breath. Mild leukocytosis. X-ray shows a possibility of right upper lobe pneumonia. Patient actually had a pneumonia in similar location back in August 2019. At present we will give her healthcare coverage. Blood cultures performed. Add Flagyl for aspiration coverage. Check MRSA PCR. Speech therapy consult for swallowing. Overnight we will keep her on dysphagia diet for safety. Aspiration precaution, must be up for meals. Repeat CT scan was recommended in August in 4 months.  I will get CT scan of the chest with contrast for further work-up.  Hypertension Hold amlodipine 5 mg daily  CVA:  Deficits on the left appears stable  continue Aggrenox twice daily  Hypothyroidism -Continue levothyroxine 37.5 mcg daily  dementia: -Continue donepezil  10 mg nightly Patient is at risk for delirium  Depression and anxiety -Continue citalopram 5 mg daily -Continue gabapentin 100 mg nightly  Nutrition: Dysphagia 1 diet, medication in applesauce. DVT Prophylaxis: subcutaneous Heparin  Advance goals of care discussion: Partial code, no CPR, no intubation.  BiPAP okay.  Consults: none  Family Communication: family was present at bedside, at the time of interview.  Opportunity was given to ask question and all questions were answered satisfactorily.  Disposition: Admitted as inpatient, telemetry unit. Likely to be discharged SNF, in 3 days.  Author: Lynden Oxford, MD [facility] 05/24/2018  If 7PM-7AM, please contact night-coverage  [email or web address]

## 2018-05-24 NOTE — ED Notes (Signed)
Patient was able to void using Purewick.

## 2018-05-24 NOTE — ED Notes (Signed)
Placed patient on Purewick 

## 2018-05-24 NOTE — ED Provider Notes (Addendum)
[facility]-EMERGENCY DEPT Provider Note   CSN: [id number] Arrival date & time: 05/24/18  1356     History   Chief Complaint Chief Complaint  Patient presents with  . Shortness of Breath    HPI Darlene Wilson is a 84 y.o. female.  HPI Level 5 caveat secondary to dementia 84 year old female presents from nursing home with reports that she has had cough and congestion.  [facility] also reported reported increased shortness of breath, lethargy, and fever for several days.  Patient denies any complaints to me. Past Medical History:  Diagnosis Date  . Chronic kidney disease 1998   stones  . Depression with anxiety 09/14/2014  . Dyspnea   . Fibrocystic breast 01/12/2011  . Glaucoma    both eyes  . History of chicken pox   . Hypertension   . Insomnia 01/13/2011  . Kidney stone    stones   . Memory loss 10/12/2012  . Nausea 05/31/2012  . Spinal stenosis 06/14/2011  . Stroke (HCC) 04/2007   x 3 total  . Thyroid disease    hypothyroid  . Urinary frequency 02/09/2011  . Vertigo 02/09/2011    Patient Active Problem List   Diagnosis Date Noted  . HCAP (healthcare-associated pneumonia) 01/12/2018  . Alzheimer disease (HCC) 08/10/2016  . Malnutrition of moderate degree 03/10/2015  . Syncope 03/08/2015  . Glaucoma 03/08/2015  . Hearing loss 09/14/2014  . Depression with anxiety 09/14/2014  . Medicare annual wellness visit, subsequent 04/13/2014  . Hyperglycemia 10/06/2013  . Neck pain 11/20/2012  . Dizziness and giddiness 11/08/2012  . Degeneration of cervical intervertebral disc 11/08/2012  . Cervical spondylosis without myelopathy 11/08/2012  . Other malaise and fatigue 11/08/2012  . Disturbance of skin sensation 11/08/2012  . Hemiplegia of dominant side as late effect following cerebrovascular disease (HCC) 11/08/2012  . Other late effects of cerebrovascular disease(438.89) 11/08/2012  . Memory  loss 10/12/2012  . Spinal stenosis 06/14/2011  . Urinary frequency 02/09/2011  . Vertigo 02/09/2011  . Insomnia 01/13/2011  . Fibrocystic breast 01/12/2011  . Hypertension   . Kidney stone   . Thyroid disease   . Stroke [facility](HCC) 04/30/2007    Past Surgical History:  Procedure Laterality Date  . ABDOMINAL HYSTERECTOMY  1963  . BACK SURGERY  1989   discectomy L4-5 1989, good results  . BREAST SURGERY  1970   biopsy, benign, b/l     OB History   No obstetric history on file.      Home Medications    Prior to Admission medications   Medication Sig Start Date End Date Taking? Authorizing Provider  acetaminophen (TYLENOL) 325 MG tablet Take 2 tablets (650 mg total) by mouth every 6 (six) hours as needed. Patient taking differently: Take 650 mg by mouth daily.  11/21/12  Yes Christiane HaSullivan, Corinna L, MD  amLODipine (NORVASC) 5 MG tablet Take 1 tablet (5 mg total) by mouth daily. 01/15/18  Yes Emokpae, Courage, MD  betaxolol (BETOPTIC-S) 0.5 % ophthalmic suspension Place 1 drop into both eyes 2 (two) times daily. 01/23/18  Yes [provider]  citalopram (CELEXA) 10 MG tablet Take 1 tablet (10 mg total) by mouth daily. 01/15/18  Yes Emokpae, Courage, MD  dipyridamole-aspirin (AGGRENOX) 200-25 MG 12hr capsule TAKE 1 CAPSULE BY MOUTH 2 (TWO) TIMES DAILY. Patient taking differently: Take 1 capsule by mouth 2 (two) times daily.  01/15/18  Yes Emokpae, Courage, MD  donepezil (ARICEPT) 10 MG  tablet Take 1 tablet (10 mg total) by mouth at bedtime. 01/23/18  Yes [provider]  gabapentin (NEURONTIN) 100 MG capsule TAKE 1 CAPSULE (100 MG TOTAL) BY MOUTH AT BEDTIME. Patient taking differently: Take 100 mg by mouth at bedtime.  01/15/18  Yes Emokpae, Courage, MD  guaiFENesin (MUCINEX) 600 MG 12 hr tablet Take 600 mg by mouth every 12 (twelve) hours.   Yes [provider]  latanoprost (XALATAN) 0.005 % ophthalmic solution Place 1 drop into both eyes at bedtime. 01/23/18  Yes  [provider]  levothyroxine (SYNTHROID, LEVOTHROID) 75 MCG tablet Take 0.5 tablets (37.5 mcg total) by mouth daily. 01/15/18  Yes Emokpae, Courage, MD  loperamide (IMODIUM A-D) 2 MG tablet Take 2 mg by mouth every 6 (six) hours as needed for diarrhea or loose stools.    Yes [provider]  OLANZapine (ZYPREXA) 2.5 MG tablet Take 2.5 mg by mouth every evening.   Yes [provider]  albuterol (PROVENTIL HFA;VENTOLIN HFA) 108 (90 Base) MCG/ACT inhaler Inhale 2 puffs into the lungs every 4 (four) hours as needed for wheezing or shortness of breath. Patient not taking: Reported on 05/24/2018 01/15/18   Shon HaleEmokpae, Courage, MD    Family History Family History  Problem Relation Age of Onset  . Heart disease Mother   . Hypertension Mother   . Diabetes Mother   . Heart disease Father   . Hypertension Father   . Cancer Sister        breast cancer  . Mental illness Brother        service connected    Social History Social History   Tobacco Use  . Smoking status: Never Smoker  . Smokeless tobacco: Never Used  Substance Use Topics  . Alcohol use: No  . Drug use: No     Allergies   Ciprofloxacin; Combigan [brimonidine tartrate-timolol]; Detrol [tolterodine tartrate]; Oxycodone; Penicillins; and Sulfa antibiotics   Review of Systems Review of Systems  Unable to perform ROS: Dementia     Physical Exam Updated Vital Signs BP (!) 128/58 (BP Location: Left Arm)   Pulse 83   Temp 100.3 F (37.9 C) (Rectal)   Resp (!) 27   SpO2 97%   Physical Exam Vitals signs and nursing note reviewed.  Constitutional:      General: She is not in acute distress.    Appearance: She is well-developed. She is not ill-appearing.  HENT:     Head: Normocephalic and atraumatic.  Eyes:     Extraocular Movements: Extraocular movements intact.     Pupils: Pupils are equal, round, and reactive to light.  Cardiovascular:     Rate and Rhythm: Normal rate and regular rhythm.    Pulmonary:     Effort: Pulmonary effort is normal.     Breath sounds: Examination of the right-upper field reveals decreased breath sounds and rhonchi. Examination of the left-upper field reveals rhonchi. Examination of the right-lower field reveals wheezing. Examination of the left-lower field reveals wheezing. Decreased breath sounds, wheezing and rhonchi present.  Chest:     Chest wall: No mass.  Abdominal:     Palpations: Abdomen is soft.  Musculoskeletal: Normal range of motion.  Skin:    General: Skin is warm and dry.     Capillary Refill: Capillary refill takes less than 2 seconds.  Neurological:     General: No focal deficit present.     Mental Status: She is alert.  Psychiatric:  Mood and Affect: Mood normal.        Behavior: Behavior normal.      ED Treatments / Results  Labs (all labs ordered are listed, but only abnormal results are displayed) Labs Reviewed  CULTURE, BLOOD (ROUTINE X 2)  CULTURE, BLOOD (ROUTINE X 2)  CBC WITH DIFFERENTIAL/PLATELET  BASIC METABOLIC PANEL  I-STAT CG4 LACTIC ACID, ED  I-STAT CG4 LACTIC ACID, ED    EKG EKG Interpretation  Date/Time:  Thursday May 24 2018 14:12:59 EST Ventricular Rate:  89 PR Interval:    QRS Duration: 86 QT Interval:  370 QTC Calculation: 451 R Axis:   14 Text Interpretation:  Sinus rhythm Low voltage, precordial leads Nonspecific T abnormalities, lateral leads No significant change since last tracing Confirmed by Margarita Grizzle [phone]) on 05/24/2018 3:10:20 PM   Radiology Dg Chest 2 View  Result Date: 05/24/2018 CLINICAL DATA:  Dyspnea with increasing lethargy and fever for a few days. EXAM: CHEST - 2 VIEW COMPARISON:  None. FINDINGS: Normal heart size and mediastinal contours with aortic atherosclerosis. Slight uncoiling of the thoracic aorta is identified. Patchy pulmonary consolidation in the right upper lobe distribution is noted consistent with pneumonia. Left lung appears clear. No acute  osseous abnormality is noted. IMPRESSION: Right upper lobe pneumonia. Followup PA and lateral chest X-Goldie Dimmer is recommended in 3-4 weeks following trial of antibiotic therapy to ensure resolution and exclude underlying malignancy. Electronically Signed   By: Tollie Eth M.D.   On: 05/24/2018 14:53    Procedures Procedures (including critical care time)  Medications Ordered in ED Medications  sodium chloride 0.9 % bolus 1,000 mL (has no administration in time range)     Initial Impression / Assessment and Plan / ED Course  I have reviewed the triage vital signs and the nursing notes.  Pertinent labs & imaging results that were available during my care of the patient were reviewed by me and considered in my medical decision making (see chart for details).    84 year old female with dementia here from nursing home with reports of lethargy and dyspnea.  X-Aemilia Dedrick reveals right upper lobe infiltrate.  Patient treated with Rocephin (reported history of penicillin allergy, but reviewed with pharmacy and patient has previously received Rocephin without any action) she also received Zithromax. She has been mildly tachypneic with a respiratory rate of 25.  She has maintained oxygen saturation.  Received some IV fluids.  Temperature was 100.4 but lactic acid is normal at 1.14.  Plan admission for further evaluation and treatment of her pneumonia. Final Clinical Impressions(s) / ED Diagnoses   Final diagnoses:  Community acquired pneumonia of right upper lobe of lung [facility])    ED Discharge Orders    None       Margarita Grizzle, MD 05/31/18 1439    Margarita Grizzle, MD 05/31/18 [phone]

## 2018-05-24 NOTE — ED Notes (Signed)
ED TO INPATIENT HANDOFF REPORT  Name/Age/Gender Darlene SchmidtJanie B Wilson 84 y.o. female  Code Status    Code Status Orders  (From admission, onward)         Start     Ordered   05/24/18 1710  Limited resuscitation (code)  Continuous    Question Answer Comment  In the event of cardiac or respiratory ARREST: Initiate Code Blue, Call Rapid Response Yes   In the event of cardiac or respiratory ARREST: Perform CPR No   In the event of cardiac or respiratory ARREST: Perform Intubation/Mechanical Ventilation No   In the event of cardiac or respiratory ARREST: Use NIPPV/BiPAp only if indicated Yes   In the event of cardiac or respiratory ARREST: Administer ACLS medications if indicated No   In the event of cardiac or respiratory ARREST: Perform Defibrillation or Cardioversion if indicated No      05/24/18 1712        Code Status History    Date Active Date Inactive Code Status Order ID Comments User Context   01/12/2018 1713 01/15/2018 1418 DNR [id number]  Delaine LamePurohit, Shrey C, MD Inpatient   01/12/2018 0842 01/12/2018 1713 DNR [id number]  Vanetta MuldersZackowski, Scott, MD ED   03/08/2015 2101 03/11/2015 1830 Full Code [id number]  Bobette Mortiz, David Manuel, MD Inpatient   10/21/2014 2005 10/22/2014 1945 Full Code [id number]  Osvaldo ShipperKrishnan, Gokul, MD ED      Home/SNF/Other Skilled nursing facility  Chief Complaint shob'weakness  Level of Care/Admitting Diagnosis ED Disposition    ED Disposition Condition Comment   Admit  [facility] [[id number]]  Level of Care: Telemetry [5]  Admit to tele based on following criteria: Complex arrhythmia (Bradycardia/Tachycardia)  Diagnosis: Pneumonia [227785]  Admitting Physician: Rolly SalterPATEL, PRANAV M [[id number]]  Attending Physician: Rolly SalterPATEL, PRANAV M [[id number]]  Estimated length of stay: past midnight tomorrow  Certification:: I certify this patient will need inpatient services for at least 2 midnights  PT Class (Do Not Modify): Inpatient [101]  PT Acc Code (Do  Not Modify): Private [1]       Medical History Past Medical History:  Diagnosis Date  . Chronic kidney disease 1998   stones  . Depression with anxiety 09/14/2014  . Dyspnea   . Fibrocystic breast 01/12/2011  . Glaucoma    both eyes  . History of chicken pox   . Hypertension   . Insomnia 01/13/2011  . Kidney stone    stones   . Memory loss 10/12/2012  . Nausea 05/31/2012  . Spinal stenosis 06/14/2011  . Stroke (HCC) 04/2007   x 3 total  . Thyroid disease    hypothyroid  . Urinary frequency 02/09/2011  . Vertigo 02/09/2011    Allergies Allergies  Allergen Reactions  . Ciprofloxacin Hives    Given with 2 other meds/unsure if truly allergic  . Combigan [Brimonidine Tartrate-Timolol] Swelling    Inflammation  . Detrol [Tolterodine Tartrate] Hives    Given with 2 other medications/unsure if truly allergic  . Oxycodone Hives    Given with 2 other medications/unsure if truly allergic  . Penicillins Hives, Itching and Rash    DID THE REACTION INVOLVE: Swelling of the face/tongue/throat, SOB, or low BP? Unknown Sudden or severe rash/hives, skin peeling, or the inside of the mouth or nose? Unknown Did it require medical treatment? Unknown When did it last happen?Unknown If all above answers are "NO", may proceed with cephalosporin use.  . Sulfa Antibiotics Hives, Itching and Rash    IV Location/Drains/Wounds Patient Lines/Drains/Airways  Status   Active Line/Drains/Airways    Name:   Placement date:   Placement time:   Site:   Days:   Peripheral IV 05/24/18 Right Forearm   05/24/18    1534    Forearm   less than 1          Labs/Imaging Results for orders placed or performed during the hospital encounter of 05/24/18 (from the past 48 hour(s))  I-Stat CG4 Lactic Acid, ED     Status: None   Collection Time: 05/24/18  3:42 PM  Result Value Ref Range   Lactic Acid, Venous 1.14 0.5 - 1.9 mmol/L  CBC with Differential/Platelet     Status: Abnormal   Collection Time:  05/24/18  3:46 PM  Result Value Ref Range   WBC 10.8 (H) 4.0 - 10.5 K/uL   RBC 3.94 3.87 - 5.11 MIL/uL   Hemoglobin 12.5 12.0 - 15.0 g/dL   HCT 16.139.4 09.636.0 - 04.546.0 %   MCV 100.0 80.0 - 100.0 fL   MCH 31.7 26.0 - 34.0 pg   MCHC 31.7 30.0 - 36.0 g/dL   RDW 40.915.3 81.111.5 - 91.415.5 %   Platelets 266 150 - 400 K/uL   nRBC 0.0 0.0 - 0.2 %   Neutrophils Relative % 74 %   Neutro Abs 8.0 (H) 1.7 - 7.7 K/uL   Lymphocytes Relative 12 %   Lymphs Abs 1.3 0.7 - 4.0 K/uL   Monocytes Relative 12 %   Monocytes Absolute 1.3 (H) 0.1 - 1.0 K/uL   Eosinophils Relative 0 %   Eosinophils Absolute 0.0 0.0 - 0.5 K/uL   Basophils Relative 0 %   Basophils Absolute 0.0 0.0 - 0.1 K/uL   Immature Granulocytes 2 %   Abs Immature Granulocytes 0.16 (H) 0.00 - 0.07 K/uL    Comment: Performed at [facility], [address] [place], [place] [address]  Basic metabolic panel     Status: Abnormal   Collection Time: 05/24/18  3:46 PM  Result Value Ref Range   Sodium 140 135 - 145 mmol/L   Potassium 3.5 3.5 - 5.1 mmol/L   Chloride 107 98 - 111 mmol/L   CO2 24 22 - 32 mmol/L   Glucose, Bld 96 70 - 99 mg/dL   BUN 24 (H) 8 - 23 mg/dL   Creatinine, Ser 6.210.94 0.44 - 1.00 mg/dL   Calcium 8.2 (L) 8.9 - 10.3 mg/dL   GFR calc non Af Amer 54 (L) >60 mL/min   GFR calc Af Amer >60 >60 mL/min   Anion gap 9 5 - 15    Comment: Performed at [facility], [address] [place], [place] [address]  Influenza panel by PCR (type A & B)     Status: None   Collection Time: 05/24/18  3:54 PM  Result Value Ref Range   Influenza A By PCR NEGATIVE NEGATIVE   Influenza B By PCR NEGATIVE NEGATIVE    Comment: (NOTE) The Xpert Xpress Flu assay is intended as an aid in the diagnosis of  influenza and should not be used as a sole basis for treatment.  This  assay is FDA approved for nasopharyngeal swab specimens only. Nasal  washings and aspirates are unacceptable for Xpert Xpress Flu testing. Performed at  [facility], [address] [place], [place] [address]   Urinalysis, Routine w reflex microscopic     Status: Abnormal   Collection Time: 05/24/18  5:02 PM  Result Value Ref  Range   Color, Urine YELLOW YELLOW   APPearance CLOUDY (A) CLEAR   Specific Gravity, Urine 1.016 1.005 - 1.030   pH 7.0 5.0 - 8.0   Glucose, UA NEGATIVE NEGATIVE mg/dL   Hgb urine dipstick NEGATIVE NEGATIVE   Bilirubin Urine NEGATIVE NEGATIVE   Ketones, ur 5 (A) NEGATIVE mg/dL   Protein, ur NEGATIVE NEGATIVE mg/dL   Nitrite NEGATIVE NEGATIVE   Leukocytes, UA NEGATIVE NEGATIVE    Comment: Performed at [facility], [address] [place], [place] [address]   Dg Chest 2 View  Result Date: 05/24/2018 CLINICAL DATA:  Dyspnea with increasing lethargy and fever for a few days. EXAM: CHEST - 2 VIEW COMPARISON:  None. FINDINGS: Normal heart size and mediastinal contours with aortic atherosclerosis. Slight uncoiling of the thoracic aorta is identified. Patchy pulmonary consolidation in the right upper lobe distribution is noted consistent with pneumonia. Left lung appears clear. No acute osseous abnormality is noted. IMPRESSION: Right upper lobe pneumonia. Followup PA and lateral chest X-ray is recommended in 3-4 weeks following trial of antibiotic therapy to ensure resolution and exclude underlying malignancy. Electronically Signed   By: Tollie Eth M.D.   On: 05/24/2018 14:53   EKG Interpretation  Date/Time:  Thursday May 24 2018 14:12:59 EST Ventricular Rate:  89 PR Interval:    QRS Duration: 86 QT Interval:  370 QTC Calculation: 451 R Axis:   14 Text Interpretation:  Sinus rhythm Low voltage, precordial leads Nonspecific T abnormalities, lateral leads No significant change since last tracing Confirmed by Margarita Grizzle [phone]) on 05/24/2018 3:10:20 PM   Pending Labs Unresulted Labs (From admission, onward)    Start     Ordered   05/25/18 0500  CBC WITH DIFFERENTIAL   Tomorrow morning,   R     05/24/18 1712   05/25/18 0500  Comprehensive metabolic panel  Tomorrow morning,   R     05/24/18 1712   05/24/18 1713  MRSA PCR Screening  Once,   R     05/24/18 1712   05/24/18 1711  Culture, sputum-assessment  Once,   R     05/24/18 1712   05/24/18 1711  Legionella Pneumophila Serogp 1 Ur Ag  Once,   R     05/24/18 1712   05/24/18 1709  Gram stain  Once,   R     05/24/18 1712   05/24/18 1709  HIV antibody (Routine Screening)  Once,   R     05/24/18 1712   05/24/18 1709  Strep pneumoniae urinary antigen  Once,   R     05/24/18 1712   05/24/18 1521  Blood culture (routine x 2)  BLOOD CULTURE X 2,   STAT     05/24/18 1521          Vitals/Pain Today's Vitals   05/24/18 1530 05/24/18 1600 05/24/18 1630 05/24/18 1700  BP: 126/64 (!) 107/58 (!) 123/56 131/61  Pulse: 82 79 83 85  Resp: (!) 25 (!) 27 20 (!) 29  Temp:      TempSrc:      SpO2: 97% 97% 98% 99%    Isolation Precautions No active isolations  Medications Medications  betaxolol (BETOPTIC-S) 0.5 % ophthalmic solution 1 drop (has no administration in time range)  citalopram (CELEXA) tablet 10 mg (has no administration in time range)  dipyridamole-aspirin (AGGRENOX) 200-25 MG per 12 hr capsule 1 capsule (has no administration in time range)  donepezil (ARICEPT) tablet 10 mg (has no administration  in time range)  gabapentin (NEURONTIN) capsule 100 mg (has no administration in time range)  guaiFENesin (MUCINEX) 12 hr tablet 600 mg (has no administration in time range)  latanoprost (XALATAN) 0.005 % ophthalmic solution 1 drop (has no administration in time range)  levothyroxine (SYNTHROID, LEVOTHROID) tablet 37.5 mcg (has no administration in time range)  OLANZapine (ZYPREXA) tablet 2.5 mg (has no administration in time range)  enoxaparin (LOVENOX) injection 40 mg (has no administration in time range)  0.9 %  sodium chloride infusion (has no administration in time range)  ceFEPIme (MAXIPIME) 2  g in sodium chloride 0.9 % 100 mL IVPB (has no administration in time range)  metroNIDAZOLE (FLAGYL) IVPB 500 mg (has no administration in time range)  sodium chloride 0.9 % bolus 1,000 mL (0 mLs Intravenous Stopped 05/24/18 1653)  cefTRIAXone (ROCEPHIN) 1 g in sodium chloride 0.9 % 100 mL IVPB (0 g Intravenous Stopped 05/24/18 1653)    Mobility manual wheelchair

## 2018-05-24 NOTE — Progress Notes (Signed)
Pharmacy Antibiotic Note  Darlene Wilson is a 84 y.o. female admitted on 05/24/2018 with pneumonia.  Pharmacy has been consulted for Vancomycin & Cefepime dosing.  Flagyl per MD, added for possible aspiration.  Plan: Cefepime 2gm x1, then 1gm q24 Vancomycin 1gm q36hr Flagyl 500mg  IV q8   Temp (24hrs), Avg:99.5 F (37.5 C), Min:98.7 F (37.1 C), Max:100.3 F (37.9 C)  Recent Labs  Lab 05/24/18 1542 05/24/18 1546  WBC  --  10.8*  CREATININE  --  0.94  LATICACIDVEN 1.14  --     CrCl cannot be calculated (Unknown ideal weight.).    Allergies  Allergen Reactions  . Ciprofloxacin Hives    Given with 2 other meds/unsure if truly allergic  . Combigan [Brimonidine Tartrate-Timolol] Swelling    Inflammation  . Detrol [Tolterodine Tartrate] Hives    Given with 2 other medications/unsure if truly allergic  . Oxycodone Hives    Given with 2 other medications/unsure if truly allergic  . Penicillins Hives, Itching and Rash    DID THE REACTION INVOLVE: Swelling of the face/tongue/throat, SOB, or low BP? Unknown Sudden or severe rash/hives, skin peeling, or the inside of the mouth or nose? Unknown Did it require medical treatment? Unknown When did it last happen?Unknown If all above answers are "NO", may proceed with cephalosporin use.  . Sulfa Antibiotics Hives, Itching and Rash   Antimicrobials this admission: 12/26 Rocephin/Azithromycin x1 in ED 12/27 Cefepime >>  12/26 Vancomycin >>  12/26 Flagyl IV >>  Dose adjustments this admission:  Microbiology results: 12/26 BCx: sent 12/26 Flu PCR: neg/neg 12/26 MRSA PCR: sent  Thank you for allowing pharmacy to be a part of this patient's care.  Darlene Wilson, Darlene Wilson 05/24/2018 5:19 PM

## 2018-05-25 LAB — CBC WITH DIFFERENTIAL/PLATELET
ABS IMMATURE GRANULOCYTES: 0.15 10*3/uL — AB (ref 0.00–0.07)
BASOS PCT: 0 %
Basophils Absolute: 0 10*3/uL (ref 0.0–0.1)
Eosinophils Absolute: 0.1 10*3/uL (ref 0.0–0.5)
Eosinophils Relative: 1 %
HCT: 34.9 % — ABNORMAL LOW (ref 36.0–46.0)
Hemoglobin: 11 g/dL — ABNORMAL LOW (ref 12.0–15.0)
Immature Granulocytes: 2 %
Lymphocytes Relative: 16 %
Lymphs Abs: 1.6 10*3/uL (ref 0.7–4.0)
MCH: 32.4 pg (ref 26.0–34.0)
MCHC: 31.5 g/dL (ref 30.0–36.0)
MCV: 102.9 fL — ABNORMAL HIGH (ref 80.0–100.0)
MONOS PCT: 11 %
Monocytes Absolute: 1.1 10*3/uL — ABNORMAL HIGH (ref 0.1–1.0)
NEUTROS ABS: 6.8 10*3/uL (ref 1.7–7.7)
Neutrophils Relative %: 70 %
Platelets: 240 10*3/uL (ref 150–400)
RBC: 3.39 MIL/uL — ABNORMAL LOW (ref 3.87–5.11)
RDW: 15.5 % (ref 11.5–15.5)
WBC: 9.7 10*3/uL (ref 4.0–10.5)
nRBC: 0 % (ref 0.0–0.2)

## 2018-05-25 LAB — COMPREHENSIVE METABOLIC PANEL
ALT: 12 U/L (ref 0–44)
AST: 13 U/L — ABNORMAL LOW (ref 15–41)
Albumin: 2.3 g/dL — ABNORMAL LOW (ref 3.5–5.0)
Alkaline Phosphatase: 68 U/L (ref 38–126)
Anion gap: 10 (ref 5–15)
BUN: 14 mg/dL (ref 8–23)
CHLORIDE: 111 mmol/L (ref 98–111)
CO2: 20 mmol/L — ABNORMAL LOW (ref 22–32)
CREATININE: 0.76 mg/dL (ref 0.44–1.00)
Calcium: 7.7 mg/dL — ABNORMAL LOW (ref 8.9–10.3)
GFR calc Af Amer: >60 mL/min (ref >60)
GFR calc non Af Amer: >60 mL/min (ref >60)
Glucose, Bld: 83 mg/dL (ref 70–99)
Potassium: 2.9 mmol/L — ABNORMAL LOW (ref 3.5–5.1)
Sodium: 141 mmol/L (ref 135–145)
Total Bilirubin: 0.8 mg/dL (ref 0.3–1.2)
Total Protein: 5.4 g/dL — ABNORMAL LOW (ref 6.5–8.1)

## 2018-05-25 LAB — STREP PNEUMONIAE URINARY ANTIGEN: Strep Pneumo Urinary Antigen: NEGATIVE

## 2018-05-25 LAB — HIV ANTIBODY (ROUTINE TESTING W REFLEX): HIV Screen 4th Generation wRfx: NONREACTIVE

## 2018-05-25 MED ORDER — POTASSIUM CHLORIDE 10 MEQ/100ML IV SOLN
10.0000 meq | INTRAVENOUS | Status: DC
  Administered 2018-05-25: 10 meq via INTRAVENOUS
  Filled 2018-05-25: qty 100

## 2018-05-25 MED ORDER — METRONIDAZOLE 500 MG PO TABS: 500.0000 mg | ORAL_TABLET | Freq: Three times a day (TID) | ORAL | Status: DC

## 2018-05-25 MED ORDER — POTASSIUM CHLORIDE CRYS ER 20 MEQ PO TBCR
40.0000 meq | EXTENDED_RELEASE_TABLET | Freq: Once | ORAL | Status: AC
  Administered 2018-05-25: 40 meq via ORAL
  Filled 2018-05-25: qty 2

## 2018-05-25 MED ORDER — SODIUM CHLORIDE 0.9 % IV SOLN
1.0000 g | INTRAVENOUS | Status: DC
  Administered 2018-05-25 – 2018-05-28 (×4): 1 g via INTRAVENOUS
  Filled 2018-05-25 (×4): qty 1

## 2018-05-25 NOTE — Care Management Important Message (Signed)
Important Message  Patient Details  Name: Darlene SchmidtJanie B Amaral MRN: [id number] Date of Birth: [birth year]   Medicare Important Message Given:  Yes    Johnny Gorter 05/25/2018, 8:48 AM

## 2018-05-25 NOTE — Progress Notes (Signed)
[facility] Progress Note  Patient: Darlene Wilson ZOX:[id number]   PCP: [name], MD DOB: [birth year]   DOA: 05/24/2018   DOS: 05/25/2018   Date of Service: the patient was seen and examined on 05/25/2018  Brief hospital course: Pt. with PMH of CVA, dementia, hypothyroidism, depression, HTN.; admitted on 05/24/2018, presented with complaint of shortnes of breath , was found to have pneumonia. Currently further plan is continually biotics.  Subjective: Still has cough and shortness of breath but also getting better.  No nausea no vomiting no diarrhea.  Assessment and Plan: Recurrent right-sided pneumonia. Patient presents with complaints of cough and shortness of breath. Mild leukocytosis. X-ray shows a possibility of right upper lobe pneumonia. Patient actually had a pneumonia in similar location back in August 2019. Patient was given broad-spectrum coverage on admission. Blood cultures so far negative. PCR negative. Speech therapy consult for swallowing commence regular diet. Aspiration precaution, must be up for meals. Repeat CT scan was recommended in August in 4 months.   Repeat CT scan of the chest with contrast negative for any mass or nodules. Continue current Antibiotics   Hypertension Hold amlodipine 5 mg daily  CVA:  Deficits on the left appears stable  continue Aggrenox twice daily  Hypothyroidism -Continue levothyroxine 37.5 mcg daily  dementia: -Continue donepezil 10 mg nightly Patient is at risk for delirium  Depression and anxiety -Continue citalopram 5 mg daily -Continue gabapentin 100 mg nightly Diet: regular diet DVT Prophylaxis: subcutaneous Heparin  Advance goals of care discussion: full code  Family Communication: no family was present at bedside, at the time of interview.   Disposition:  Discharge to SNF.  Consultants: none Procedures: none  Scheduled Meds: . betaxolol  1 drop Both Eyes BID  . citalopram  10 mg Oral  Daily  . dipyridamole-aspirin  1 capsule Oral BID  . donepezil  10 mg Oral QHS  . enoxaparin (LOVENOX) injection  40 mg Subcutaneous Q24H  . gabapentin  100 mg Oral QHS  . guaiFENesin  600 mg Oral Q12H  . latanoprost  1 drop Both Eyes QHS  . levothyroxine  37.5 mcg Oral QAC breakfast  . OLANZapine  2.5 mg Oral QPM   Continuous Infusions: . sodium chloride 75 mL/hr at 05/25/18 1554  . cefTRIAXone (ROCEPHIN)  IV 1 g (05/25/18 1551)   PRN Meds:  Antibiotics: Anti-infectives (From admission, onward)   Start     Dose/Rate Route Frequency Ordered Stop   05/25/18 1800  ceFEPIme (MAXIPIME) 1 g in sodium chloride 0.9 % 100 mL IVPB  Status:  Discontinued     1 g 200 mL/hr over 30 Minutes Intravenous Every 24 hours 05/24/18 1903 05/25/18 1219   05/25/18 1600  cefTRIAXone (ROCEPHIN) 1 g in sodium chloride 0.9 % 100 mL IVPB     1 g 200 mL/hr over 30 Minutes Intravenous Every 24 hours 05/25/18 1228     05/25/18 1400  metroNIDAZOLE (FLAGYL) tablet 500 mg  Status:  Discontinued     500 mg Oral Every 8 hours 05/25/18 0753 05/25/18 1219   05/24/18 2200  vancomycin (VANCOCIN) IVPB 1000 mg/200 mL premix  Status:  Discontinued     1,000 mg 200 mL/hr over 60 Minutes Intravenous Every 36 hours 05/24/18 1907 05/25/18 0723   05/24/18 1800  ceFEPIme (MAXIPIME) 2 g in sodium chloride 0.9 % 100 mL IVPB  Status:  Discontinued     2 g 200 mL/hr over 30 Minutes Intravenous Every 8 hours 05/24/18 1712 05/24/18  1906   05/24/18 1800  metroNIDAZOLE (FLAGYL) IVPB 500 mg  Status:  Discontinued     500 mg 100 mL/hr over 60 Minutes Intravenous Every 8 hours 05/24/18 1712 05/25/18 0753   05/24/18 1545  cefTRIAXone (ROCEPHIN) 1 g in sodium chloride 0.9 % 100 mL IVPB     1 g 200 mL/hr over 30 Minutes Intravenous  Once 05/24/18 1535 05/24/18 1653   05/24/18 1545  azithromycin (ZITHROMAX) 500 mg in sodium chloride 0.9 % 250 mL IVPB  Status:  Discontinued     500 mg 250 mL/hr over 60 Minutes Intravenous  Once 05/24/18  1535 05/24/18 1712       Objective: Physical Exam: Vitals:   05/24/18 2018 05/25/18 0627 05/25/18 1509 05/25/18 1618  BP: 129/66 (!) 121/55 (!) 143/76 (!) 146/76  Pulse: 87 78 77 75  Resp: 20 20 (!) 34 (!) 32  Temp: 99.2 F (37.3 C) 99.1 F (37.3 C) 99.8 F (37.7 C) 98.9 F (37.2 C)  TempSrc: Oral Oral Oral Oral  SpO2: 95% 94% 95% 93%  Weight:      Height:        Intake/Output Summary (Last 24 hours) at 05/25/2018 1815 Last data filed at 05/25/2018 1517 Gross per 24 hour  Intake 1518.42 ml  Output 600 ml  Net 918.42 ml   Filed Weights   05/24/18 1805  Weight: 62.9 kg   General: Alert, Awake and Oriented to Time, Place and Person. Appear in mild distress, affect appropriate Eyes: PERRL, Conjunctiva normal ENT: Oral Mucosa clear moist. Neck: no JVD, no Abnormal Mass Or lumps Cardiovascular: S1 and S2 Present, no Murmur, Peripheral Pulses Present Respiratory: normal respiratory effort, Bilateral Air entry equal and Decreased, no use of accessory muscle, bilateral Crackles, no wheezes Abdomen: Bowel Sound present, Soft and no tenderness, no hernia Skin: no redness, no Rash, no induration Extremities: no Pedal edema, no calf tenderness Neurologic: Grossly no focal neuro deficit. Bilaterally Equal motor strength  Data Reviewed: CBC: Recent Labs  Lab 05/24/18 1546 05/25/18 0532  WBC 10.8* 9.7  NEUTROABS 8.0* 6.8  HGB 12.5 11.0*  HCT 39.4 34.9*  MCV 100.0 102.9*  PLT 266 240   Basic Metabolic Panel: Recent Labs  Lab 05/24/18 1546 05/25/18 0532  NA 140 141  K 3.5 2.9*  CL 107 111  CO2 24 20*  GLUCOSE 96 83  BUN 24* 14  CREATININE 0.94 0.76  CALCIUM 8.2* 7.7*    Liver Function Tests: Recent Labs  Lab 05/25/18 0532  AST 13*  ALT 12  ALKPHOS 68  BILITOT 0.8  PROT 5.4*  ALBUMIN 2.3*   No results for input(s): LIPASE, AMYLASE in the last 168 hours. No results for input(s): AMMONIA in the last 168 hours. Coagulation Profile: No results for  input(s): INR, PROTIME in the last 168 hours. Cardiac Enzymes: No results for input(s): CKTOTAL, CKMB, CKMBINDEX, TROPONINI in the last 168 hours. BNP (last 3 results) No results for input(s): PROBNP in the last 8760 hours. CBG: No results for input(s): GLUCAP in the last 168 hours. Studies: Ct Angio Chest Pe W Or Wo Contrast  Result Date: 05/24/2018 CLINICAL DATA:  Shortness of breath and lethargy EXAM: CT ANGIOGRAPHY CHEST WITH CONTRAST TECHNIQUE: Multidetector CT imaging of the chest was performed using the standard protocol during bolus administration of intravenous contrast. Multiplanar CT image reconstructions and MIPs were obtained to evaluate the vascular anatomy. Examination is somewhat limited by patient motion artifact. CONTRAST:  100mL ISOVUE-370 IOPAMIDOL (ISOVUE-370) INJECTION 76%  COMPARISON:  None. FINDINGS: Cardiovascular: Thoracic aorta demonstrates diffuse atherosclerotic calcifications without aneurysmal dilatation or dissection. No cardiac enlargement is seen. Mild coronary calcifications are noted. The pulmonary artery is well visualized centrally without evidence pulmonary emboli. The peripheral branches are somewhat bolus as well as patient motion artifact during the exam. Mediastinum/Nodes: The esophagus is within normal limits. No hilar or mediastinal adenopathy is seen. The thoracic inlet is unremarkable. Lungs/Pleura: Left lung is well aerated without focal infiltrate or sizable effusion. The right lung demonstrates multifocal infiltrate consistent with acute pneumonia particularly within the right upper lobe but to a lesser degree in the right lower lobe. Small right effusion is seen. Upper Abdomen: Visualized upper abdomen is within normal limits. Musculoskeletal: Degenerative changes of the thoracic spine noted. Review of the MIP images confirms the above findings. IMPRESSION: No definitive pulmonary emboli are seen although peripheral branches are somewhat limited due to  patient motion artifact. Patchy multifocal infiltrates involving the right upper and right lower lobes. Small right pleural effusion. Aortic Atherosclerosis (ICD10-I70.0). Electronically Signed   By: Alcide CleverMark  Lukens M.D.   On: 05/24/2018 21:47     Time spent: 35 minutes  Author: Lynden OxfordPranav Jamala Kohen, MD [facility] Pager: [phone] 05/25/2018 6:15 PM  Between 7PM-7AM, please contact night-coverage at [email or web address], password [email or web address]

## 2018-05-25 NOTE — Progress Notes (Signed)
Pharmacy Antibiotic Note  Darlene Wilson is a 84 y.o. female admitted on 05/24/2018 with pneumonia.  Pharmacy has been consulted for Cefepime dosing.  Flagyl per MD, added for possible aspiration. 05/25/2018:  Afebrile  WBC WNL  Renal function at patient's baseline  Plan: Continue Cefepime 1gm IV q24h Change Flagyl 500mg  PO q8h No further dose adjustments anticipated.  Pharmacy to sign off.  Please re-consult if needed.    Temp (24hrs), Avg:99.2 F (37.3 C), Min:98.5 F (36.9 C), Max:100.3 F (37.9 C)  Recent Labs  Lab 05/24/18 1542 05/24/18 1546 05/25/18 0532  WBC  --  10.8* 9.7  CREATININE  --  0.94 0.76  LATICACIDVEN 1.14  --   --     Estimated Creatinine Clearance: 42.5 mL/min (by C-G formula based on SCr of 0.76 mg/dL).    Allergies  Allergen Reactions  . Ciprofloxacin Hives    Given with 2 other meds/unsure if truly allergic  . Combigan [Brimonidine Tartrate-Timolol] Swelling    Inflammation  . Detrol [Tolterodine Tartrate] Hives    Given with 2 other medications/unsure if truly allergic  . Oxycodone Hives    Given with 2 other medications/unsure if truly allergic  . Penicillins Hives, Itching and Rash    DID THE REACTION INVOLVE: Swelling of the face/tongue/throat, SOB, or low BP? Unknown Sudden or severe rash/hives, skin peeling, or the inside of the mouth or nose? Unknown Did it require medical treatment? Unknown When did it last happen?Unknown If all above answers are "NO", may proceed with cephalosporin use.  . Sulfa Antibiotics Hives, Itching and Rash   Antimicrobials this admission: 12/26 Rocephin/Azithromycin x1 in ED 12/27 Cefepime >>  12/26 Vancomycin >> 12/27 12/26 Flagyl IV >>  Dose adjustments this admission:  Microbiology results: 12/26 BCx: sent 12/26 Flu PCR: neg/neg 12/26 MRSA PCR: neg  Thank you for allowing pharmacy to be a part of this patient's care.  Elson ClanLilliston, Jehieli Brassell Michelle 05/25/2018 7:45 AM

## 2018-05-25 NOTE — Evaluation (Signed)
Clinical/Bedside Swallow Evaluation Patient Details  Name: Darlene Wilson MRN: [id number] Date of Birth: 11-25-1926  Today's Date: 05/25/2018 Time: SLP Start Time (ACUTE ONLY): 1202 SLP Stop Time (ACUTE ONLY): 1223 SLP Time Calculation (min) (ACUTE ONLY): 21 min  Past Medical History:  Past Medical History:  Diagnosis Date  . Chronic kidney disease 1998   stones  . Depression with anxiety 09/14/2014  . Dyspnea   . Fibrocystic breast 01/12/2011  . Glaucoma    both eyes  . History of chicken pox   . Hypertension   . Insomnia 01/13/2011  . Kidney stone    stones   . Memory loss 10/12/2012  . Nausea 05/31/2012  . Spinal stenosis 06/14/2011  . Stroke (HCC) 04/2007   x 3 total  . Thyroid disease    hypothyroid  . Urinary frequency 02/09/2011  . Vertigo 02/09/2011   Past Surgical History:  Past Surgical History:  Procedure Laterality Date  . ABDOMINAL HYSTERECTOMY  1963  . BACK SURGERY  1989   discectomy L4-5 1989, good results  . BREAST SURGERY  1970   biopsy, benign, b/l   HPI:  84 y.o. female with Past medical history of CVA, dementia, hypothyroidism, depression, HTN. CT chest on 05/24/18 indicated Patchy multifocal infiltrates involving the right upper and right lower lobes; small right pleural effusion; pt presented to ED with AMS which generated BSE.   Assessment / Plan / Recommendation Clinical Impression   Pt presents with a normal oropharyngeal swallow with adequate timing noted; she has a cognitive impairment at baseline and [facility]; her daughter-in-law stated she eats a regular/thin liquid diet at the facility without difficulty; pt observed with various consistencies including thin via straw, puree and solids without overt s/s of aspiration noted; ST will f/u x1 for diet tolerance d/t hx of cognitive impairment and recent PNA for safety/education re: breathing/swallowing reciprocity; thank you for this consult. SLP Visit Diagnosis:  Dysphagia, unspecified (R13.10)    Aspiration Risk  Mild aspiration risk    Diet Recommendation   Regular/thin liquids  Medication Administration: Whole meds with liquid    Other  Recommendations Oral Care Recommendations: Oral care BID   Follow up Recommendations None      Frequency and Duration min 1 x/week  1 week       Prognosis Prognosis for Safe Diet Advancement: Good Barriers to Reach Goals: Cognitive deficits      Swallow Study   General Date of Onset: 05/24/18 HPI: 84 y.o. female with Past medical history of CVA, dementia, hypothyroidism, depression, HTN. Type of Study: Bedside Swallow Evaluation Previous Swallow Assessment: (n/a) Diet Prior to this Study: Dysphagia 1 (puree);Thin liquids Temperature Spikes Noted: Yes(low grade) Respiratory Status: Room air History of Recent Intubation: No Behavior/Cognition: Alert;Cooperative;Confused;Distractible Oral Cavity Assessment: Within Functional Limits Oral Care Completed by SLP: No Oral Cavity - Dentition: Adequate natural dentition;Missing dentition Vision: Functional for self-feeding Self-Feeding Abilities: Able to feed self Patient Positioning: Upright in chair Baseline Vocal Quality: Low vocal intensity Volitional Cough: Strong Volitional Swallow: Able to elicit    Oral/Motor/Sensory Function Overall Oral Motor/Sensory Function: Within functional limits   Ice Chips Ice chips: Not tested   Thin Liquid Thin Liquid: Within functional limits Presentation: Straw    Nectar Thick Nectar Thick Liquid: Not tested   Honey Thick Honey Thick Liquid: Not tested   Puree Puree: Within functional limits Presentation: Self Fed   Solid     Solid: Within functional limits Presentation: Self Fed  Tressie StalkerPat Adams, M.S., CCC-SLP 05/25/2018,12:53 PM

## 2018-05-25 NOTE — Evaluation (Signed)
Physical Therapy Evaluation Patient Details Name: Darlene SchmidtJanie B Wilson MRN: [id number] DOB: Sep 16, 1926 Today's Date: 05/25/2018   History of Present Illness  84 yo female admitted 12/26 with pneumonia, from Memory care. H/O depression, hypothyroid,memory loss.  Clinical Impression  The patient is pleasant. States that she walks and runs. The patient required assist [facility] then to [facility]. Patient should return to ambulation status with assist. Pt admitted with above diagnosis. Pt currently with functional limitations due to the deficits listed below (see PT Problem List).  Pt will benefit from skilled PT to increase their independence and safety with mobility to allow discharge to the venue listed below.       Follow Up Recommendations SNF    Equipment Recommendations  None recommended by PT    Recommendations for Other Services       Precautions / Restrictions Precautions Precautions: Fall Precaution Comments: incontinence      Mobility  Bed Mobility Overal bed mobility: Needs Assistance Bed Mobility: Supine to Sit     Supine to sit: Min assist     General bed mobility comments: multimodal cues for bed mobility  Transfers Overall transfer level: Needs assistance   Transfers: Sit to/from Stand;Stand Pivot Transfers Sit to Stand: Mod assist Stand pivot transfers: Mod assist       General transfer comment: multimodal cues  to guide [facility] to [facility].   Ambulation/Gait             General Gait Details: NT  Stairs            Wheelchair Mobility    Modified Rankin (Stroke Patients Only)       Balance Overall balance assessment: Needs assistance Sitting-balance support: Bilateral upper extremity supported;Feet supported Sitting balance-Leahy Scale: Fair     Standing balance support: During functional activity;Single extremity supported Standing balance-Leahy Scale: Poor Standing balance comment: needs assist                             Pertinent Vitals/Pain Pain Assessment: No/denies pain    Home Living Family/patient expects to be discharged to:: Assisted living                 Additional Comments: from [facility] care [facility]    Prior Function           Comments: unsure if independent , patient reports that she runs until she gets tired.     Hand Dominance        Extremity/Trunk Assessment   Upper Extremity Assessment Upper Extremity Assessment: Generalized weakness    Lower Extremity Assessment Lower Extremity Assessment: Generalized weakness    Cervical / Trunk Assessment Cervical / Trunk Assessment: Normal  Communication   Communication: No difficulties  Cognition Arousal/Alertness: Awake/alert Behavior During Therapy: WFL for tasks assessed/performed Overall Cognitive Status: No family/caregiver present to determine baseline cognitive functioning                                 General Comments: patient in Memory care      General Comments      Exercises     Assessment/Plan    PT Assessment Patient needs continued PT services  PT Problem List Decreased strength;Decreased activity tolerance;Decreased balance;Decreased mobility;Decreased knowledge of precautions;Decreased safety awareness;Decreased knowledge of use of DME       PT Treatment  Interventions DME instruction;Gait training;Functional mobility training;Therapeutic activities;Patient/family education    PT Goals (Current goals can be found in the Care Plan section)  Acute Rehab PT Goals Patient Stated Goal: agreed to get up. PT Goal Formulation: Patient unable to participate in goal setting Time For Goal Achievement: 06/08/18 Potential to Achieve Goals: Good    Frequency Min 2X/week   Barriers to discharge        Co-evaluation               AM-PAC PT "6 Clicks" Mobility  Outcome Measure Help needed turning from your back to your side  while in a flat bed without using bedrails?: Total Help needed moving from lying on your back to sitting on the side of a flat bed without using bedrails?: Total Help needed moving to and from a bed to a chair (including a wheelchair)?: Total Help needed standing up from a chair using your arms (e.g., wheelchair or bedside chair)?: Total Help needed to walk in hospital room?: Total Help needed climbing 3-5 steps with a railing? : Total 6 Click Score: 6    End of Session   Activity Tolerance: Patient tolerated treatment well Patient left: in chair;with call bell/phone within reach;with chair alarm set;with nursing/sitter in room Nurse Communication: Mobility status PT Visit Diagnosis: Unsteadiness on feet (R26.81)    Time: [id number] PT Time Calculation (min) (ACUTE ONLY): 33 min   Charges:   PT Evaluation $PT Eval Low Complexity: 1 Low PT Treatments $Therapeutic Activity: 8-22 mins        Blanchard KelchKaren Terance Pomplun PT Acute Rehabilitation Services Pager [phone] Office [phone]   Rada HayHill, [facility] 05/25/2018, 11:18 AM

## 2018-05-25 NOTE — Progress Notes (Signed)
[facility] Progress Note  Patient: Darlene Wilson ZOX:[id number]   PCP: [name], MD DOB: [birth year]   DOA: 05/24/2018   DOS: 05/25/2018   Date of Service: the patient was seen and examined on 05/25/2018  Brief hospital course: Pt. with PMH of CVA, dementia, hypothyroidism, depression, HTN.; admitted on 05/24/2018, presented with complaint of shortnes of breath , was found to have pneumonia. Currently further plan is continually biotics.  Subjective: Still has cough and shortness of breath but also getting better.  No nausea no vomiting no diarrhea.  Assessment and Plan: Recurrent right-sided pneumonia. Patient presents with complaints of cough and shortness of breath. Mild leukocytosis. X-ray shows a possibility of right upper lobe pneumonia. Patient actually had a pneumonia in similar location back in August 2019. Patient was given broad-spectrum coverage on admission. Blood cultures so far negative. PCR negative. Speech therapy consult for swallowing commence regular diet. Aspiration precaution, must be up for meals. Repeat CT scan was recommended in August in 4 months.   Repeat CT scan of the chest with contrast negative for any mass or nodules. Continue current Antibiotics   Hypertension Hold amlodipine 5 mg daily  CVA:  Deficits on the left appears stable  continue Aggrenox twice daily  Hypothyroidism -Continue levothyroxine 37.5 mcg daily  dementia: -Continue donepezil 10 mg nightly Patient is at risk for delirium  Depression and anxiety -Continue citalopram 5 mg daily -Continue gabapentin 100 mg nightly  Hypokalemia. We will replace. Monitor tomorrow.  Diet: regular diet DVT Prophylaxis: subcutaneous Heparin  Advance goals of care discussion: full code  Family Communication: no family was present at bedside, at the time of interview.   Disposition:  Discharge to SNF.  Consultants: none Procedures: none  Scheduled Meds: .  betaxolol  1 drop Both Eyes BID  . citalopram  10 mg Oral Daily  . dipyridamole-aspirin  1 capsule Oral BID  . donepezil  10 mg Oral QHS  . enoxaparin (LOVENOX) injection  40 mg Subcutaneous Q24H  . gabapentin  100 mg Oral QHS  . guaiFENesin  600 mg Oral Q12H  . latanoprost  1 drop Both Eyes QHS  . levothyroxine  37.5 mcg Oral QAC breakfast  . OLANZapine  2.5 mg Oral QPM   Continuous Infusions: . sodium chloride 75 mL/hr at 05/25/18 1554  . cefTRIAXone (ROCEPHIN)  IV 1 g (05/25/18 1551)   PRN Meds:  Antibiotics: Anti-infectives (From admission, onward)   Start     Dose/Rate Route Frequency Ordered Stop   05/25/18 1800  ceFEPIme (MAXIPIME) 1 g in sodium chloride 0.9 % 100 mL IVPB  Status:  Discontinued     1 g 200 mL/hr over 30 Minutes Intravenous Every 24 hours 05/24/18 1903 05/25/18 1219   05/25/18 1600  cefTRIAXone (ROCEPHIN) 1 g in sodium chloride 0.9 % 100 mL IVPB     1 g 200 mL/hr over 30 Minutes Intravenous Every 24 hours 05/25/18 1228     05/25/18 1400  metroNIDAZOLE (FLAGYL) tablet 500 mg  Status:  Discontinued     500 mg Oral Every 8 hours 05/25/18 0753 05/25/18 1219   05/24/18 2200  vancomycin (VANCOCIN) IVPB 1000 mg/200 mL premix  Status:  Discontinued     1,000 mg 200 mL/hr over 60 Minutes Intravenous Every 36 hours 05/24/18 1907 05/25/18 0723   05/24/18 1800  ceFEPIme (MAXIPIME) 2 g in sodium chloride 0.9 % 100 mL IVPB  Status:  Discontinued     2 g 200 mL/hr over 30  Minutes Intravenous Every 8 hours 05/24/18 1712 05/24/18 1906   05/24/18 1800  metroNIDAZOLE (FLAGYL) IVPB 500 mg  Status:  Discontinued     500 mg 100 mL/hr over 60 Minutes Intravenous Every 8 hours 05/24/18 1712 05/25/18 0753   05/24/18 1545  cefTRIAXone (ROCEPHIN) 1 g in sodium chloride 0.9 % 100 mL IVPB     1 g 200 mL/hr over 30 Minutes Intravenous  Once 05/24/18 1535 05/24/18 1653   05/24/18 1545  azithromycin (ZITHROMAX) 500 mg in sodium chloride 0.9 % 250 mL IVPB  Status:  Discontinued     500  mg 250 mL/hr over 60 Minutes Intravenous  Once 05/24/18 1535 05/24/18 1712       Objective: Physical Exam: Vitals:   05/24/18 2018 05/25/18 0627 05/25/18 1509 05/25/18 1618  BP: 129/66 (!) 121/55 (!) 143/76 (!) 146/76  Pulse: 87 78 77 75  Resp: 20 20 (!) 34 (!) 32  Temp: 99.2 F (37.3 C) 99.1 F (37.3 C) 99.8 F (37.7 C) 98.9 F (37.2 C)  TempSrc: Oral Oral Oral Oral  SpO2: 95% 94% 95% 93%  Weight:      Height:        Intake/Output Summary (Last 24 hours) at 05/25/2018 1818 Last data filed at 05/25/2018 1517 Gross per 24 hour  Intake 1518.42 ml  Output 600 ml  Net 918.42 ml   Filed Weights   05/24/18 1805  Weight: 62.9 kg   General: Alert, Awake and Oriented to Time, Place and Person. Appear in mild distress, affect appropriate Eyes: PERRL, Conjunctiva normal ENT: Oral Mucosa clear moist. Neck: no JVD, no Abnormal Mass Or lumps Cardiovascular: S1 and S2 Present, no Murmur, Peripheral Pulses Present Respiratory: normal respiratory effort, Bilateral Air entry equal and Decreased, no use of accessory muscle, bilateral Crackles, no wheezes Abdomen: Bowel Sound present, Soft and no tenderness, no hernia Skin: no redness, no Rash, no induration Extremities: no Pedal edema, no calf tenderness Neurologic: Grossly no focal neuro deficit. Bilaterally Equal motor strength  Data Reviewed: CBC: Recent Labs  Lab 05/24/18 1546 05/25/18 0532  WBC 10.8* 9.7  NEUTROABS 8.0* 6.8  HGB 12.5 11.0*  HCT 39.4 34.9*  MCV 100.0 102.9*  PLT 266 240   Basic Metabolic Panel: Recent Labs  Lab 05/24/18 1546 05/25/18 0532  NA 140 141  K 3.5 2.9*  CL 107 111  CO2 24 20*  GLUCOSE 96 83  BUN 24* 14  CREATININE 0.94 0.76  CALCIUM 8.2* 7.7*    Liver Function Tests: Recent Labs  Lab 05/25/18 0532  AST 13*  ALT 12  ALKPHOS 68  BILITOT 0.8  PROT 5.4*  ALBUMIN 2.3*   No results for input(s): LIPASE, AMYLASE in the last 168 hours. No results for input(s): AMMONIA in the  last 168 hours. Coagulation Profile: No results for input(s): INR, PROTIME in the last 168 hours. Cardiac Enzymes: No results for input(s): CKTOTAL, CKMB, CKMBINDEX, TROPONINI in the last 168 hours. BNP (last 3 results) No results for input(s): PROBNP in the last 8760 hours. CBG: No results for input(s): GLUCAP in the last 168 hours. Studies: Ct Angio Chest Pe W Or Wo Contrast  Result Date: 05/24/2018 CLINICAL DATA:  Shortness of breath and lethargy EXAM: CT ANGIOGRAPHY CHEST WITH CONTRAST TECHNIQUE: Multidetector CT imaging of the chest was performed using the standard protocol during bolus administration of intravenous contrast. Multiplanar CT image reconstructions and MIPs were obtained to evaluate the vascular anatomy. Examination is somewhat limited by patient motion artifact.  CONTRAST:  100mL ISOVUE-370 IOPAMIDOL (ISOVUE-370) INJECTION 76% COMPARISON:  None. FINDINGS: Cardiovascular: Thoracic aorta demonstrates diffuse atherosclerotic calcifications without aneurysmal dilatation or dissection. No cardiac enlargement is seen. Mild coronary calcifications are noted. The pulmonary artery is well visualized centrally without evidence pulmonary emboli. The peripheral branches are somewhat bolus as well as patient motion artifact during the exam. Mediastinum/Nodes: The esophagus is within normal limits. No hilar or mediastinal adenopathy is seen. The thoracic inlet is unremarkable. Lungs/Pleura: Left lung is well aerated without focal infiltrate or sizable effusion. The right lung demonstrates multifocal infiltrate consistent with acute pneumonia particularly within the right upper lobe but to a lesser degree in the right lower lobe. Small right effusion is seen. Upper Abdomen: Visualized upper abdomen is within normal limits. Musculoskeletal: Degenerative changes of the thoracic spine noted. Review of the MIP images confirms the above findings. IMPRESSION: No definitive pulmonary emboli are seen  although peripheral branches are somewhat limited due to patient motion artifact. Patchy multifocal infiltrates involving the right upper and right lower lobes. Small right pleural effusion. Aortic Atherosclerosis (ICD10-I70.0). Electronically Signed   By: Alcide CleverMark  Lukens M.D.   On: 05/24/2018 21:47     Time spent: 35 minutes  Author: Lynden OxfordPranav Anan Dapolito, MD [facility] Pager: [phone] 05/25/2018 6:18 PM  Between 7PM-7AM, please contact night-coverage at [email or web address], password [email or web address]

## 2018-05-26 ENCOUNTER — Encounter (HOSPITAL_COMMUNITY): Payer: Self-pay | Admitting: Family Medicine

## 2018-05-26 DIAGNOSIS — I69959 Hemiplegia and hemiparesis following unspecified cerebrovascular disease affecting unspecified side: Secondary | ICD-10-CM

## 2018-05-26 DIAGNOSIS — I700 Atherosclerosis of aorta: Secondary | ICD-10-CM

## 2018-05-26 DIAGNOSIS — J181 Lobar pneumonia, unspecified organism: Secondary | ICD-10-CM

## 2018-05-26 DIAGNOSIS — I10 Essential (primary) hypertension: Secondary | ICD-10-CM

## 2018-05-26 HISTORY — DX: Atherosclerosis of aorta: I70.0

## 2018-05-26 MED ORDER — AZITHROMYCIN 250 MG PO TABS
250.0000 mg | ORAL_TABLET | Freq: Every day | ORAL | Status: DC
  Administered 2018-05-26 – 2018-05-28 (×3): 250 mg via ORAL
  Filled 2018-05-26 (×3): qty 1

## 2018-05-26 NOTE — Progress Notes (Signed)
PROGRESS NOTE  Darlene SchmidtJanie B Wilson WUJ:[id number] DOB: 11-07-1926 DOA: 05/24/2018 PCP: Bradd CanaryBlyth, Stacey A, MD  Brief Narrative: 84 year old woman resident SNF memory care unit, PMH including dementia, hypothyroidism, presented with cough, shortness of breath for 48 hours, no known aspiration or choking events.  Admitted 12/26 for pneumonia.  Assessment/Plan Right upper and lower lobe pneumonia, also with pneumonia similar location August 2019.  CT scan of chest negative for mass or nodules.  Influenza PCR negative.  Strep pneumoniae urinary screen negative.  Lactic acid quickly normalized.  Procalcitonin was negative. --Continue antibiotics, appears to be responding to ceftriaxone, will add back Zithromax to cover atypicals, given no known history of aspiration, will not add specific anaerobic coverage at this point.  Essential hypertension --Appears stable.  Not on any medications.  Hypothyroidism --Continue levothyroxine  PMH CVA with left-sided deficits --Continue Aggrenox  Depression, anxiety --Appears stable.  Continue citalopram  Dementia --Appears stable.  Continue donepezil  Mild aspiration risk --Continue regular, thin liquids per speech therapy evaluation 12/27  Aortic atherosclerosis --Follow-up as an outpatient   Monitor upper airway noise wheezing.  Continue empiric antibiotics, plan as above.  Anticipate transfer to skilled nursing facility 12/30.  DVT prophylaxis: enoxaparin Code Status: Partial, no CPR, no intubation.  BiPAP okay. Family Communication: none [facility]. PT recommended SNF   Brendia Sacksaniel Marselino Slayton, MD  [facility] Direct contact: [phone] --Via amion app OR  --[email or web address]; password [email or web address]  7PM-7AM contact night coverage as above 05/26/2018, 10:47 AM  LOS: 2 days   Consultants:    Procedures:    Antimicrobials:  Azithromycin 12/26  Ceftriaxone 12/26 >  Metronidazole 12/26 >  12/27  Interval history/Subjective: Afebrile, vital signs stable, no hypoxia documented Complains of wheezing, shortness of breath.  Tolerating diet.  No nausea or vomiting. Per RN patient was resting comfortably earlier.  Objective: Vitals:  Vitals:   05/25/18 2155 05/26/18 0520  BP: (!) 151/74 (!) 154/86  Pulse: 82 77  Resp: 20 20  Temp: 99.3 F (37.4 C) 98.8 F (37.1 C)  SpO2: 93% 92%    Exam:  Constitutional:  . Appears calm, uncomfortable, nontoxic, audible wheezing heard Eyes:  . pupils and irises appear normal . Normal lids ENMT:  . grossly normal hearing  . Lips appear normal Respiratory:  . Audible wheezing, upper airway noise noted, lungs CTA bilaterally, no w/r/r.  . Respiratory effort moderately increased.  Appears short of breath. Cardiovascular:  . RRR, no m/r/g . No LE extremity edema   Abdomen:  . Soft, nontender, nondistended Musculoskeletal:  . RLE, LLE   . Moves both legs to command Psychiatric:  . Mental status o Mood, affect appropriate  I have personally reviewed the following:   Data: . No labs today, potassium was repleted yesterday.  BMP otherwise unremarkable yesterday as were LFTs and CBC.  Leukocytosis had resolved yesterday. . Chest x-ray on admission showed right upper lobe pneumonia. . CT angiography this admission showed no pulmonary emboli.  Patchy multifocal infiltrates are seen right upper and right lower lobes. . EKG on admission sinus rhythm, no acute changes  Scheduled Meds: . azithromycin  250 mg Oral Daily  . betaxolol  1 drop Both Eyes BID  . citalopram  10 mg Oral Daily  . dipyridamole-aspirin  1 capsule Oral BID  . donepezil  10 mg Oral QHS  . enoxaparin (LOVENOX) injection  40 mg Subcutaneous Q24H  . gabapentin  100 mg Oral QHS  . guaiFENesin  600  mg Oral Q12H  . latanoprost  1 drop Both Eyes QHS  . levothyroxine  37.5 mcg Oral QAC breakfast  . OLANZapine  2.5 mg Oral QPM   Continuous Infusions: . sodium  chloride 75 mL/hr at 05/26/18 0244  . cefTRIAXone (ROCEPHIN)  IV Stopped (05/25/18 1621)    Principal Problem:   Lobar pneumonia (HCC) Active Problems:   Hypertension   Stroke (HCC)   Hemiplegia of dominant side as late effect following cerebrovascular disease (HCC)   Depression with anxiety   Alzheimer disease (HCC)   LOS: 2 days

## 2018-05-27 DIAGNOSIS — G301 Alzheimer's disease with late onset: Secondary | ICD-10-CM

## 2018-05-27 DIAGNOSIS — F0281 Dementia in other diseases classified elsewhere with behavioral disturbance: Secondary | ICD-10-CM

## 2018-05-27 LAB — LEGIONELLA PNEUMOPHILA SEROGP 1 UR AG: L. pneumophila Serogp 1 Ur Ag: NEGATIVE

## 2018-05-27 MED ORDER — GUAIFENESIN-DM 100-10 MG/5ML PO SYRP
5.0000 mL | ORAL_SOLUTION | ORAL | Status: DC | PRN
  Administered 2018-05-27: 5 mL via ORAL
  Filled 2018-05-27: qty 10

## 2018-05-27 NOTE — Progress Notes (Signed)
  Speech Language Pathology Treatment:    Patient Details Name: Frederik SchmidtJanie B Latif MRN: [id number] DOB: 1927-02-22 Today's Date: 05/27/2018 Time: [id number] SLP Time Calculation (min) (ACUTE ONLY): 13 min  Assessment / Plan / Recommendation Clinical Impression  Patient seen for f/u diet tolerance. Patient alert and pleasant. Able to consume regular solids and thin liquids with no indication of aspiration except for one time cough post swallow with multiple consecutive sips of thin liquid. Note moderately increased WOB with liquids intake which may account for poor coordination of breath and swallow. In light of h/o recurrent RLL PNA, recommend MBS to ensure that patient on safest diet. MBS planned for 12/30.     HPI HPI: 84 y.o. female with Past medical history of CVA, dementia, hypothyroidism, depression, HTN.      SLP Plan  MBS       Recommendations  Diet recommendations: Regular;Thin liquid Liquids provided via: Cup;Straw Medication Administration: Whole meds with puree(per patient request) Supervision: Patient able to self feed;Intermittent supervision to cue for compensatory strategies Compensations: Slow rate;Small sips/bites Postural Changes and/or Swallow Maneuvers: Seated upright 90 degrees                Oral Care Recommendations: Oral care BID Follow up Recommendations: None SLP Visit Diagnosis: Dysphagia, unspecified (R13.10) Plan: MBS       GO             Symphany Fleissner MA, CCC-SLP     Wilborn Membreno Meryl 05/27/2018, 3:51 PM

## 2018-05-27 NOTE — Progress Notes (Signed)
  PROGRESS NOTE  Darlene SchmidtJanie B Wilson ZOX:[id number] DOB: 1926-07-08 DOA: 05/24/2018 PCP: Bradd CanaryBlyth, Stacey A, MD  Brief Narrative: 84 year old woman resident SNF memory care unit, PMH including dementia, hypothyroidism, presented with cough, shortness of breath for 48 hours, no known aspiration or choking events.  Admitted 12/26 for pneumonia.  Assessment/Plan Right upper and lower lobe pneumonia, also with pneumonia similar location August 2019.  CT scan of chest negative for mass or nodules.  Influenza PCR negative.  Strep pneumoniae urinary screen negative.  Lactic acid quickly normalized.  Procalcitonin was negative. --Appears to be improving.  Continue ceftriaxone, azithromycin.  Essential hypertension --Per chart.  Has been stable here without pharmacotherapy.  Hypothyroidism --Continue levothyroxine  PMH CVA with left-sided deficits --Continue Aggrenox  Depression, anxiety --Appears stable.  Continue citalopram  Dementia --Stable, continue donepezil  Mild aspiration risk --Continue regular, thin liquids per speech therapy evaluation 12/27  Aortic atherosclerosis --Follow-up as an outpatient   Overall improving, await transfer to skilled nursing facility when bed available  DVT prophylaxis: enoxaparin Code Status: Partial, no CPR, no intubation.  BiPAP okay. Family Communication: none [facility]. PT recommended SNF   Brendia Sacksaniel Goodrich, MD  [facility] Direct contact: [phone] --Via amion app OR  --[email or web address]; password [email or web address]  7PM-7AM contact night coverage as above 05/27/2018, 12:37 PM  LOS: 3 days   Consultants:    Procedures:    Antimicrobials:  Azithromycin 12/26  Ceftriaxone 12/26 >  Metronidazole 12/26 > 12/27  Interval history/Subjective: Feels better today.  Breathing fine.  No pain.  No complaints.  Tolerating diet.  Objective: Vitals:  Vitals:   05/27/18 0000 05/27/18 0434  BP:  (!)  128/55  Pulse:  68  Resp:    Temp: 97.7 F (36.5 C) 99.2 F (37.3 C)  SpO2:  97%    Exam: Constitutional:   . Appears calm and comfortable Respiratory:  . CTA bilaterally, no w/r/r.  . Respiratory effort normal. . Minimal intermittent upper airway wheezes Cardiovascular:  . RRR, no m/r/g . No LE extremity edema   Abdomen:  . Soft, nontender, nondistended Psychiatric:  . Mental status o Mood, affect appropriate  I have personally reviewed the following:   Data: . No labs today  Scheduled Meds: . azithromycin  250 mg Oral Daily  . betaxolol  1 drop Both Eyes BID  . citalopram  10 mg Oral Daily  . dipyridamole-aspirin  1 capsule Oral BID  . donepezil  10 mg Oral QHS  . enoxaparin (LOVENOX) injection  40 mg Subcutaneous Q24H  . gabapentin  100 mg Oral QHS  . guaiFENesin  600 mg Oral Q12H  . latanoprost  1 drop Both Eyes QHS  . levothyroxine  37.5 mcg Oral QAC breakfast  . OLANZapine  2.5 mg Oral QPM   Continuous Infusions: . sodium chloride 75 mL/hr at 05/27/18 1101  . cefTRIAXone (ROCEPHIN)  IV Stopped (05/26/18 1542)    Principal Problem:   Lobar pneumonia (HCC) Active Problems:   Hypertension   Stroke (HCC)   Hemiplegia of dominant side as late effect following cerebrovascular disease (HCC)   Depression with anxiety   Alzheimer disease (HCC)   Aortic atherosclerosis (HCC)   LOS: 3 days

## 2018-05-28 ENCOUNTER — Inpatient Hospital Stay (HOSPITAL_COMMUNITY): Payer: Medicare Other

## 2018-05-28 MED ORDER — IPRATROPIUM BROMIDE 0.02 % IN SOLN
0.5000 mg | Freq: Four times a day (QID) | RESPIRATORY_TRACT | Status: DC
  Administered 2018-05-28 (×2): 0.5 mg via RESPIRATORY_TRACT
  Filled 2018-05-28 (×2): qty 2.5

## 2018-05-28 MED ORDER — IPRATROPIUM BROMIDE 0.02 % IN SOLN
0.5000 mg | Freq: Two times a day (BID) | RESPIRATORY_TRACT | Status: DC
  Administered 2018-05-29: 0.5 mg via RESPIRATORY_TRACT
  Filled 2018-05-28 (×2): qty 2.5

## 2018-05-28 NOTE — Care Management Important Message (Signed)
Important Message  Patient Details  Name: Darlene Wilson MRN: [id number] Date of Birth: 04/26/1927   Medicare Important Message Given:  Yes    Caren MacadamFuller, Ramani Riva 05/28/2018, 12:01 PMImportant Message  Patient Details  Name: Darlene Wilson MRN: [id number] Date of Birth: 04/26/1927   Medicare Important Message Given:  Yes    Caren MacadamFuller, Anitha Kreiser 05/28/2018, 12:01 PM

## 2018-05-28 NOTE — Progress Notes (Signed)
Modified Barium Swallow Progress Note  Patient Details  Name: Darlene Wilson MRN: [id number] Date of Birth: June 16, 1926  Today's Date: 05/28/2018  Modified Barium Swallow completed.  Full report located under Chart Review in the Imaging Section.  Brief recommendations include the following:  Clinical Impression  Pt presents with functional oropharyngeal swallow marked by adequate mastication, reliable laryngeal vestibule closure with no penetration/aspiration; no residue post-swallow with normal pharyngeal squeeze.  Material passed through UES with no resistance.  No indication of a dysphagia-related aspiration pna.  Continue regular diet, thin liquids; no SLP f/u warranted.    Swallow Evaluation Recommendations       SLP Diet Recommendations: Regular solids;Thin liquid   Liquid Administration via: Straw;Cup   Medication Administration: Whole meds with liquid               Oral Care Recommendations: Oral care BID       [name] L. Samson Fredericouture, MA CCC/SLP Acute Rehabilitation Services Office number [phone] Pager [phone]  Blenda MountsCouture, [name] 05/28/2018,12:59 PM

## 2018-05-28 NOTE — Progress Notes (Addendum)
  PROGRESS NOTE  Darlene SchmidtJanie B Wilson XBM:[id number] DOB: 1927/01/04 DOA: 05/24/2018 PCP: Darlene Wilson  Brief Narrative: 84 year old woman resident SNF memory care unit, PMH including dementia, hypothyroidism, presented with cough, shortness of breath for 48 hours, no known aspiration or choking events.  Admitted 12/26 for pneumonia.  Assessment/Plan Right upper and lower lobe pneumonia, also with pneumonia similar location August 2019.  CT scan of chest negative for mass or nodules.  Influenza PCR negative.  Strep pneumoniae urinary screen negative.  Lactic acid quickly normalized.  Procalcitonin was negative. --No hypoxia, respiratory status appears stable.  Continue ceftriaxone, azithromycin.  Essential hypertension --Per chart.  Has been stable here without pharmacotherapy.  No medications indicated at this time.  Hypothyroidism --Continue levothyroxine  PMH CVA with left-sided deficits --Continue Aggrenox  Depression, anxiety --Appears stable, continue citalopram  Dementia --Remains stable, continue donepezil  Mild aspiration risk --Continue regular, thin liquids per speech therapy evaluation 12/30  Aortic atherosclerosis --Follow-up as an outpatient   Appears stable for transfer to skilled nursing facility. Left message for CSW.  DVT prophylaxis: enoxaparin Code Status: Partial, no CPR, no intubation.  BiPAP okay. Family Communication: discussed with son by telephone. [facility] PT recommended SNF   Darlene Sacksaniel Shaylinn Hladik, Wilson  [facility] Direct contact: [phone] --Via amion app OR  --[email or web address]; password [email or web address]  7PM-7AM contact night coverage as above 05/28/2018, 1:44 PM  LOS: 4 days   Consultants:    Procedures:    Antimicrobials:  Azithromycin 12/26  Ceftriaxone 12/26 >  Metronidazole 12/26 > 12/27  Interval history/Subjective: Feels fine, no complaints.  Objective: Vitals:  Vitals:   05/28/18 1010 05/28/18 1322  BP:  (!) 112/59  Pulse:  75  Resp:  (!) 24  Temp:  98.1 F (36.7 C)  SpO2: 93% 94%    Exam: Constitutional:   . Appears calm and comfortable ENMT:  . grossly normal hearing  Respiratory:  . CTA bilaterally, no w/r/r.  . Respiratory effort normal.  Cardiovascular:  . RRR, no m/r/g . No LE extremity edema   Abdomen:  . Soft, nontender, nondistended Musculoskeletal:  . RUE, LUE, RLE, LLE   . Moves all extremities Skin:  . No rashes, lesions, ulcers noted Psychiatric:  . Mental status o Mood, affect appropriate  I have personally reviewed the following:   Data: . No new data other than modified barium swallow study  Scheduled Meds: . azithromycin  250 mg Oral Daily  . betaxolol  1 drop Both Eyes BID  . citalopram  10 mg Oral Daily  . dipyridamole-aspirin  1 capsule Oral BID  . donepezil  10 mg Oral QHS  . enoxaparin (LOVENOX) injection  40 mg Subcutaneous Q24H  . gabapentin  100 mg Oral QHS  . guaiFENesin  600 mg Oral Q12H  . ipratropium  0.5 mg Nebulization QID  . latanoprost  1 drop Both Eyes QHS  . levothyroxine  37.5 mcg Oral QAC breakfast  . OLANZapine  2.5 mg Oral QPM   Continuous Infusions: . sodium chloride 75 mL/hr at 05/28/18 1019  . cefTRIAXone (ROCEPHIN)  IV Stopped (05/27/18 1550)    Principal Problem:   Lobar pneumonia (HCC) Active Problems:   Hypertension   Stroke (HCC)   Hemiplegia of dominant side as late effect following cerebrovascular disease (HCC)   Depression with anxiety   Alzheimer disease (HCC)   Aortic atherosclerosis (HCC)   LOS: 4 days

## 2018-05-28 NOTE — Progress Notes (Signed)
Physical Therapy Treatment Patient Details Name: Darlene SchmidtJanie B Wilson MRN: [id number] DOB: Mar 05, 1927 Today's Date: 05/28/2018    History of Present Illness 84 yo female admitted 12/26 with pneumonia, from Memory care. H/O depression, hypothyroid,memory loss.    PT Comments     patient moaning and coughing . Patient incontinent of B/B. Assisted to be cleaned up and transferred to recliner with 2 assist. Patient is much weaker than previous visit by theis therapist. Daughter in law present, reports that patient ambulated with assist and rollator.   Follow Up Recommendations  SNF     Equipment Recommendations    none   Recommendations for Other Services       Precautions / Restrictions Precautions Precautions: Fall Precaution Comments: incontinence    Mobility  Bed Mobility Overal bed mobility: Needs Assistance Bed Mobility: Supine to Sit;Rolling Rolling: Max assist;+2 for safety/equipment   Supine to sit: Max assist     General bed mobility comments: multimodal cues for bed mobility  Transfers Overall transfer level: Needs assistance Equipment used: 2 person hand held assist Transfers: Sit to/from [employer] CorporationStand;Stand Pivot Transfers Sit to Stand: Max assist;+2 physical assistance;+2 safety/equipment Stand pivot transfers: Max assist;+2 physical assistance;+2 safety/equipment       General transfer comment: multimodal cues  to guide patient to [facility] then to recliner.   Ambulation/Gait                 Stairs             Wheelchair Mobility    Modified Rankin (Stroke Patients Only)       Balance                                            Cognition Arousal/Alertness: Lethargic     Area of Impairment: Attention                               General Comments: patient is moaning  and not really engaging. follows with extra time      Exercises      General Comments        Pertinent Vitals/Pain Pain  Assessment: No/denies pain    Home Living                      Prior Function            PT Goals (current goals can now be found in the care plan section) Progress towards PT goals: Not progressing toward goals - comment(much weaker today)    Frequency    Min 2X/week      PT Plan Current plan remains appropriate    Co-evaluation              AM-PAC PT "6 Clicks" Mobility   Outcome Measure  Help needed turning from your back to your side while in a flat bed without using bedrails?: Total Help needed moving from lying on your back to sitting on the side of a flat bed without using bedrails?: Total Help needed moving to and from a bed to a chair (including a wheelchair)?: Total Help needed standing up from a chair using your arms (e.g., wheelchair or bedside chair)?: Total Help needed to walk in hospital room?: Total Help needed climbing 3-5 steps with a railing? : Total  6 Click Score: 6    End of Session Equipment Utilized During Treatment: Gait belt Activity Tolerance: Patient limited by fatigue Patient left: in chair;with call bell/phone within reach;with chair alarm set Nurse Communication: Mobility status PT Visit Diagnosis: Unsteadiness on feet (R26.81)     Time: [id number] PT Time Calculation (min) (ACUTE ONLY): 29 min  Charges:  $Therapeutic Activity: 23-37 mins                     Blanchard KelchKaren Igor Bishop PT Acute Rehabilitation Services Pager [phone] Office [phone]    Rada HayHill, Jericho Cieslik Elizabeth 05/28/2018, 1:51 PM

## 2018-05-29 DIAGNOSIS — J181 Lobar pneumonia, unspecified organism: Principal | ICD-10-CM

## 2018-05-29 LAB — BASIC METABOLIC PANEL
Anion gap: 9 (ref 5–15)
CHLORIDE: 104 mmol/L (ref 98–111)
CO2: 27 mmol/L (ref 22–32)
CREATININE: 0.6 mg/dL (ref 0.44–1.00)
Calcium: 7.9 mg/dL — ABNORMAL LOW (ref 8.9–10.3)
GFR calc Af Amer: >60 mL/min (ref >60)
GFR calc non Af Amer: >60 mL/min (ref >60)
Glucose, Bld: 98 mg/dL (ref 70–99)
Potassium: 2.8 mmol/L — ABNORMAL LOW (ref 3.5–5.1)
Sodium: 140 mmol/L (ref 135–145)

## 2018-05-29 LAB — CULTURE, BLOOD (ROUTINE X 2)
CULTURE: NO GROWTH
CULTURE: NO GROWTH

## 2018-05-29 MED ORDER — CLINDAMYCIN HCL 300 MG PO CAPS
300.0000 mg | ORAL_CAPSULE | Freq: Three times a day (TID) | ORAL | Status: DC
  Administered 2018-05-29 – 2018-05-30 (×4): 300 mg via ORAL
  Filled 2018-05-29 (×4): qty 1

## 2018-05-29 MED ORDER — IPRATROPIUM BROMIDE 0.02 % IN SOLN: 0.5000 mg | Freq: Four times a day (QID) | RESPIRATORY_TRACT | Status: DC | PRN

## 2018-05-29 MED ORDER — POTASSIUM CHLORIDE CRYS ER 20 MEQ PO TBCR
40.0000 meq | EXTENDED_RELEASE_TABLET | Freq: Two times a day (BID) | ORAL | Status: DC
  Administered 2018-05-29 – 2018-05-30 (×3): 40 meq via ORAL
  Filled 2018-05-29 (×3): qty 2

## 2018-05-29 NOTE — [place] FL2 (Signed)
MEDICAID FL2 LEVEL OF CARE SCREENING TOOL     IDENTIFICATION  Patient Name: Darlene SchmidtJanie B Fortner Birthdate: 1926-07-16 Sex: female Admission Date (Current Location): 05/24/2018  [facility] [place]   Facility and Address:  [facility],  [address], [place] [id number]      Provider Number: [id number]  Attending Physician Name and Address:  Rhetta MuraSamtani, Jai-Gurmukh, MD  Relative Name and Phone Number:       Current Level of Care: Hospital Recommended Level of Care: Memory Care Prior Approval Number:    Date Approved/Denied:   PASRR Number:    Discharge Plan: Other (Comment)(Memory Care)    Current Diagnoses: Patient Active Problem List   Diagnosis Date Noted  . Lobar pneumonia (HCC) 05/26/2018  . Aortic atherosclerosis (HCC) 05/26/2018  . Alzheimer disease (HCC) 08/10/2016  . Malnutrition of moderate degree 03/10/2015  . Syncope 03/08/2015  . Glaucoma 03/08/2015  . Hearing loss 09/14/2014  . Depression with anxiety 09/14/2014  . Medicare annual wellness visit, subsequent 04/13/2014  . Hyperglycemia 10/06/2013  . Neck pain 11/20/2012  . Dizziness and giddiness 11/08/2012  . Degeneration of cervical intervertebral disc 11/08/2012  . Cervical spondylosis without myelopathy 11/08/2012  . Other malaise and fatigue 11/08/2012  . Disturbance of skin sensation 11/08/2012  . Hemiplegia of dominant side as late effect following cerebrovascular disease (HCC) 11/08/2012  . Other late effects of cerebrovascular disease(438.89) 11/08/2012  . Memory loss 10/12/2012  . Spinal stenosis 06/14/2011  . Urinary frequency 02/09/2011  . Vertigo 02/09/2011  . Insomnia 01/13/2011  . Fibrocystic breast 01/12/2011  . Hypertension   . Kidney stone   . Thyroid disease   . Stroke (HCC) 04/30/2007    Orientation RESPIRATION BLADDER Height & Weight     Self  Normal Incontinent, External catheter Weight: 138 lb 10.7 oz (62.9 kg) Height:  5' 4.8"  (164.6 cm)  BEHAVIORAL SYMPTOMS/MOOD NEUROLOGICAL BOWEL NUTRITION STATUS      Incontinent Diet(Please see discharge summary)  AMBULATORY STATUS COMMUNICATION OF NEEDS Skin   Limited Assist Verbally Normal                       Personal Care Assistance Level of Assistance  Bathing, Feeding, Dressing Bathing Assistance: Limited assistance Feeding assistance: Limited assistance Dressing Assistance: Limited assistance     Functional Limitations Info             SPECIAL CARE FACTORS FREQUENCY  PT (By licensed PT)     PT Frequency: Home Health               Contractures      Additional Factors Info  Code Status Code Status Info: Partial             Current Medications (05/29/2018):  This is the current hospital active medication list Current Facility-Administered Medications  Medication Dose Route Frequency Provider Last Rate Last Dose  . betaxolol (BETOPTIC-S) 0.5 % ophthalmic solution 1 drop  1 drop Both Eyes BID Rolly SalterPatel, Pranav M, MD   1 drop at 05/29/18 1005  . citalopram (CELEXA) tablet 10 mg  10 mg Oral Daily Rolly SalterPatel, Pranav M, MD   10 mg at 05/29/18 1043  . clindamycin (CLEOCIN) capsule 300 mg  300 mg Oral Q8H Rhetta MuraSamtani, Jai-Gurmukh, MD   300 mg at 05/29/18 1043  . dipyridamole-aspirin (AGGRENOX) 200-25 MG per 12 hr capsule 1 capsule  1 capsule Oral BID Rolly SalterPatel, Pranav M, MD   1 capsule  at 05/29/18 1043  . donepezil (ARICEPT) tablet 10 mg  10 mg Oral QHS Rolly SalterPatel, Pranav M, MD   10 mg at 05/27/18 2151  . enoxaparin (LOVENOX) injection 40 mg  40 mg Subcutaneous Q24H Rolly SalterPatel, Pranav M, MD   40 mg at 05/27/18 2151  . gabapentin (NEURONTIN) capsule 100 mg  100 mg Oral QHS Rolly SalterPatel, Pranav M, MD   100 mg at 05/27/18 2150  . guaiFENesin (MUCINEX) 12 hr tablet 600 mg  600 mg Oral Q12H Rolly SalterPatel, Pranav M, MD   600 mg at 05/29/18 1043  . guaiFENesin-dextromethorphan (ROBITUSSIN DM) 100-10 MG/5ML syrup 5 mL  5 mL Oral Q4H PRN Standley BrookingGoodrich, Daniel P, MD   5 mL at 05/27/18 0225  .  ipratropium (ATROVENT) nebulizer solution 0.5 mg  0.5 mg Nebulization BID Standley BrookingGoodrich, Daniel P, MD   0.5 mg at 05/29/18 [id number]  . latanoprost (XALATAN) 0.005 % ophthalmic solution 1 drop  1 drop Both Eyes QHS Rolly SalterPatel, Pranav M, MD   1 drop at 05/28/18 2300  . levothyroxine (SYNTHROID, LEVOTHROID) tablet 37.5 mcg  37.5 mcg Oral QAC breakfast Rolly SalterPatel, Pranav M, MD   37.5 mcg at 05/29/18 0541  . OLANZapine (ZYPREXA) tablet 2.5 mg  2.5 mg Oral QPM Rolly SalterPatel, Pranav M, MD   2.5 mg at 05/28/18 1721  . potassium chloride SA (K-DUR,KLOR-CON) CR tablet 40 mEq  40 mEq Oral BID Rhetta MuraSamtani, Jai-Gurmukh, MD   40 mEq at 05/29/18 1244     Discharge Medications: Please see discharge summary for a list of discharge medications.  Relevant Imaging Results:  Relevant Lab Results:   Additional Information    Donnie CoffinErin M Liyah Higham, LCSW

## 2018-05-29 NOTE — Progress Notes (Signed)
CSW spoke with patients daughter, [name], via phone call regarding PT's recommendations. Daughter stated she would [facility] due to patient being more familiar with facility. CSW updated MD who stated [facility] stable for [facility].   CSW spoke with [facility]Don[facility] and facility is able to accept patient whenever she is medically stable. Facility requested updated FL2.  Stacy GardnerErin Anett Ranker, LCSW Clinical Social Worker  System Wide Float  [phone]

## 2018-05-29 NOTE — Care Management Note (Signed)
Case Management Note  Patient Details  Name: Frederik SchmidtJanie B Frye MRN: [id number] Date of Birth: 10/19/1926  Subjective/Objective: From memory care[facility]-CSW following.Noted d/c plan [facility]-facility uses [facility] HHRN/PT/OT/aide,CSW-rep Kandee KeenCory aware of HHC.                   Action/Plan:dc ALF w/HHC.   Expected Discharge Date:  (unknown)               Expected Discharge Plan:  Assisted Living / Rest Home  In-House Referral:  Clinical Social Work  Discharge planning Services  CM Consult  Post Acute Care Choice:    Choice offered to:  Adult Children  DME Arranged:    DME Agency:     HH Arranged:  RN, PT, OT, Nurse's Aide, Social Work [employer] Agency:  [facility]  Status of Service:  In process, will continue to follow  If discussed at Long Length of Stay Meetings, dates discussed:    Additional Comments:  Lanier ClamMahabir, Avonda Toso, RN 05/29/2018, 12:39 PM

## 2018-05-29 NOTE — Progress Notes (Signed)
Pt. became agitated and refused bedtime medication.

## 2018-05-29 NOTE — Progress Notes (Signed)
  PROGRESS NOTE  Frederik SchmidtJanie B Wilson XBM:[id number] DOB: 1927/01/27 DOA: 05/24/2018 PCP: Bradd CanaryBlyth, Stacey A, MD  Brief Narrative: 84 year old woman resident SNF memory care unit, PMH including dementia (probable vascular), hypothyroidism, bradycardia prior history of CVA on Aggrenox presented with cough, shortness of breath for 48 hours, no known aspiration or choking events.  Admitted 12/26 for pneumonia.  Significant past medical history + events- admission 8/16-8/19 right-sided pneumonia lactic acidosis dementia Admission 10/9-10/12 2016-encephalopathy probably secondary to UTI Hospitalized Oct 14, 2009 for probable TIA/CVA 2009-right-sided deficits-recurrent events 08/12/2011--last seen by neurologist Dr. Pearlean BrownieSethi 02/12/2015   Assessment/Plan Right upper and lower lobe pneumonia, also with pneumonia similar location August 2019.  CT scan of chest negative for mass or nodules.  Influenza PCR negative.  Strep pneumoniae urinary screen negative.  Lactic acid quickly normalized.  Procalcitonin was negative. --No hypoxia, respiratory status appears stable.  Transitioned from current antibiotics to oral clindamycin to complete on 06/02/2018 10-day course for aspiration  Essential hypertension --Per chart.  Not requiring any medicines at this time  Hypothyroidism --Continue levothyroxine 37.5 daily  PMH CVA with left-sided deficits --Continue Aggrenox  Depression, anxiety --Appears stable, continue citalopram 10, Zyprexa 2.5 at bedtime, Aricept 10 daily  Dementia --Remains stable, continue donepezil  Mild aspiration risk --Continue regular, thin liquids per speech therapy evaluation 12/30  Aortic atherosclerosis --Follow-up as an outpatient   Appears stable for transfer to skilled nursing facility. Left message for CSW.  DVT prophylaxis: enoxaparin Code Status: Partial, no CPR, no intubation.  BiPAP okay. Family Communication: No family present at the bedside today [facility].  Potassium is not at baseline and really needs repletion and recheck labs prior to discharge   05/29/2018, 7:30 AM  LOS: 5 days   Antimicrobials:  Azithromycin 12/26  Ceftriaxone 12/26 >  Metronidazole 12/26 > 12/27  Interval history/Subjective:  Awake but confused cannot give much history  Objective: Vitals:  Vitals:   05/28/18 2106 05/29/18 0457  BP:  (!) 141/72  Pulse:  78  Resp:  20  Temp:  98.6 F (37 C)  SpO2: 95% 91%    Exam:  Awake alert pleasant no distress EOMI NCAT No icterus no pallor Chest is clear Abdomen is soft without rebound or guarding Neurologically intact No focal deficit No lower extremity edema   I have personally reviewed the following:   Data: . Potassium 2.8  Scheduled Meds: . azithromycin  250 mg Oral Daily  . betaxolol  1 drop Both Eyes BID  . citalopram  10 mg Oral Daily  . dipyridamole-aspirin  1 capsule Oral BID  . donepezil  10 mg Oral QHS  . enoxaparin (LOVENOX) injection  40 mg Subcutaneous Q24H  . gabapentin  100 mg Oral QHS  . guaiFENesin  600 mg Oral Q12H  . ipratropium  0.5 mg Nebulization BID  . latanoprost  1 drop Both Eyes QHS  . levothyroxine  37.5 mcg Oral QAC breakfast  . OLANZapine  2.5 mg Oral QPM   Continuous Infusions: . sodium chloride 75 mL/hr at 05/29/18 0300  . cefTRIAXone (ROCEPHIN)  IV Stopped (05/28/18 1825)    Principal Problem:   Lobar pneumonia (HCC) Active Problems:   Hypertension   Stroke (HCC)   Hemiplegia of dominant side as late effect following cerebrovascular disease (HCC)   Depression with anxiety   Alzheimer disease (HCC)   Aortic atherosclerosis (HCC)   LOS: 5 days

## 2018-05-30 LAB — CBC WITH DIFFERENTIAL/PLATELET
Abs Immature Granulocytes: 0.17 10*3/uL — ABNORMAL HIGH (ref 0.00–0.07)
Basophils Absolute: 0 10*3/uL (ref 0.0–0.1)
Basophils Relative: 0 %
EOS ABS: 0.1 10*3/uL (ref 0.0–0.5)
Eosinophils Relative: 1 %
HCT: 34.5 % — ABNORMAL LOW (ref 36.0–46.0)
Hemoglobin: 11.2 g/dL — ABNORMAL LOW (ref 12.0–15.0)
Immature Granulocytes: 2 %
Lymphocytes Relative: 15 %
Lymphs Abs: 1.3 10*3/uL (ref 0.7–4.0)
MCH: 32.6 pg (ref 26.0–34.0)
MCHC: 32.5 g/dL (ref 30.0–36.0)
MCV: 100.3 fL — ABNORMAL HIGH (ref 80.0–100.0)
Monocytes Absolute: 0.9 10*3/uL (ref 0.1–1.0)
Monocytes Relative: 10 %
NEUTROS PCT: 72 %
Neutro Abs: 6.5 10*3/uL (ref 1.7–7.7)
Platelets: 332 10*3/uL (ref 150–400)
RBC: 3.44 MIL/uL — ABNORMAL LOW (ref 3.87–5.11)
RDW: 14.6 % (ref 11.5–15.5)
WBC: 9 10*3/uL (ref 4.0–10.5)
nRBC: 0 % (ref 0.0–0.2)

## 2018-05-30 LAB — COMPREHENSIVE METABOLIC PANEL
ALT: 16 U/L (ref 0–44)
AST: 20 U/L (ref 15–41)
Albumin: 2.3 g/dL — ABNORMAL LOW (ref 3.5–5.0)
Alkaline Phosphatase: 59 U/L (ref 38–126)
Anion gap: 8 (ref 5–15)
BUN: <5 mg/dL — ABNORMAL LOW (ref 8–23)
CALCIUM: 8 mg/dL — AB (ref 8.9–10.3)
CO2: 26 mmol/L (ref 22–32)
Chloride: 103 mmol/L (ref 98–111)
Creatinine, Ser: 0.62 mg/dL (ref 0.44–1.00)
GFR calc Af Amer: >60 mL/min (ref >60)
GFR calc non Af Amer: >60 mL/min (ref >60)
Glucose, Bld: 106 mg/dL — ABNORMAL HIGH (ref 70–99)
Potassium: 3.6 mmol/L (ref 3.5–5.1)
Sodium: 137 mmol/L (ref 135–145)
Total Bilirubin: 0.5 mg/dL (ref 0.3–1.2)
Total Protein: 6 g/dL — ABNORMAL LOW (ref 6.5–8.1)

## 2018-05-30 LAB — MAGNESIUM: Magnesium: 2.1 mg/dL (ref 1.7–2.4)

## 2018-05-30 MED ORDER — IPRATROPIUM BROMIDE 0.02 % IN SOLN: 0.5000 mg | Freq: Four times a day (QID) | RESPIRATORY_TRACT | 12 refills | Status: AC | PRN

## 2018-05-30 MED ORDER — OLANZAPINE 2.5 MG PO TABS: 2.5000 mg | ORAL_TABLET | Freq: Every evening | ORAL | 0 refills | Status: AC

## 2018-05-30 MED ORDER — CITALOPRAM HYDROBROMIDE 10 MG PO TABS: 10.0000 mg | ORAL_TABLET | Freq: Every day | ORAL | 0 refills | Status: DC

## 2018-05-30 MED ORDER — POTASSIUM CHLORIDE CRYS ER 20 MEQ PO TBCR: 40.0000 meq | EXTENDED_RELEASE_TABLET | Freq: Two times a day (BID) | ORAL | Status: DC

## 2018-05-30 MED ORDER — CLINDAMYCIN HCL 300 MG PO CAPS: 300.0000 mg | ORAL_CAPSULE | Freq: Three times a day (TID) | ORAL | 0 refills | Status: AC

## 2018-05-30 NOTE — Progress Notes (Signed)
Patient discharging back to [facility]. CSW coordinated the patient discharge with the resident director [place]. Patient FL2 completed and signed. D/C summary and DNR sent. Nurse call report to: [phone] Patient daughter informed about patient discharge back to the facility today.  PTAR/EMS  arranged to transport.   Vivi Barrack, Alexander Mt, MSW Clinical Social Worker  [phone] 05/30/2018  12:00 PM

## 2018-05-30 NOTE — Discharge Summary (Signed)
Physician Discharge Summary  Darlene Wilson ZOX:[id number] DOB: [birth year] DOA: 05/24/2018  PCP: [name], MD  Admit date: 05/24/2018 Discharge date: 05/30/2018  Time spent: 40 minutes  Recommendations for Outpatient Follow-up:  1. No new medications of clindamycin and potassium will need repeat labs in about 1 week 2. Reorient and keep diurnal cycle intact as is prone to confusion 3. Needs repeat chest x-ray in 1 month to denote clearing of infiltrates 4. May require outpatient goals of care given concerns for recurrent pneumonia  Discharge Diagnoses:  Principal Problem:   Lobar pneumonia (HCC) Active Problems:   Hypertension   Stroke [facility])   Hemiplegia of dominant side as late effect following cerebrovascular disease (HCC)   Depression with anxiety   Alzheimer disease (HCC)   Aortic atherosclerosis (HCC)   Discharge Condition: Improved  Diet recommendation: Heart healthy  Filed Weights   05/24/18 1805  Weight: 62.9 kg    History of present illness:   85 year old woman resident SNF memory care unit, PMH including dementia (probable vascular), hypothyroidism, bradycardia prior history of CVA on Aggrenox presented with cough, shortness of breath for 48 hours, no known aspiration or choking events.  Admitted 12/26 for pneumonia.  Significant past medical history + events- admission 8/16-8/19 right-sided pneumonia lactic acidosis dementia Admission 10/9-10/12 2016-encephalopathy probably secondary to UTI Hospitalized Oct 14, 2009 for probable TIA/CVA 2009-right-sided deficits-recurrent events 08/12/2011--last seen by neurologist Dr. Pearlean Brownie 02/12/2015   Hospital Course:  Assessment/Plan Right upper and lower lobe pneumonia, also with pneumonia similar location August 2019.  CT scan of chest negative for mass or nodules.  Influenza PCR negative.  Strep pneumoniae urinary screen negative.  Lactic acid quickly normalized.  Procalcitonin was negative. --No hypoxia,  respiratory status appears stable.  Transitioned from current antibiotics to oral clindamycin to complete on 06/02/2018 10-day course for aspiration  Essential hypertension --Per chart.  Not requiring any medicines at this time  -Recurrent hypokalemia-potassium was low in the 2.8 range-this normalized with potassium 40 twice daily and magnesium was stable  Hypothyroidism --Continue levothyroxine 37.5 daily  PMH CVA with left-sided deficits --Continue Aggrenox  Depression, anxiety --Appears stable, continue citalopram 10, Zyprexa 2.5 at bedtime, Aricept 10 daily  Dementia --Remains stable, continue donepezil  Mild aspiration risk --Continue regular, thin liquids per speech therapy evaluation 12/30  Aortic atherosclerosis --Follow-up as an outpatient   Discharge Exam: Vitals:   05/30/18 0457 05/30/18 0500  BP: (!) 148/72   Pulse: 86   Resp: 18   Temp:  99 F (37.2 C)  SpO2: 96%     General: Awake alert pleasant no distress but somewhat confused Cardiovascular: S1-S2 no murmur rub or gallop Respiratory: Clinically clear no added sound Abdomen soft no rebound no guarding No lower extremity edema Range of motion intact Stage I sacral decubitus which was present prior to admission  Discharge Instructions   Discharge Instructions    Diet - low sodium heart healthy   Complete by:  As directed    Increase activity slowly   Complete by:  As directed      Allergies as of 05/30/2018      Reactions   Ciprofloxacin Hives   Given with 2 other meds/unsure if truly allergic   Combigan [brimonidine Tartrate-timolol] Swelling   Inflammation   Detrol [tolterodine Tartrate] Hives   Given with 2 other medications/unsure if truly allergic   Oxycodone Hives   Given with 2 other medications/unsure if truly allergic   Penicillins Hives, Itching, Rash   DID  THE REACTION INVOLVE: Swelling of the face/tongue/throat, SOB, or low BP? Unknown Sudden or severe rash/hives, skin  peeling, or the inside of the mouth or nose? Unknown Did it require medical treatment? Unknown When did it last happen?Unknown If all above answers are "NO", may proceed with cephalosporin use.   Sulfa Antibiotics Hives, Itching, Rash      Medication List    STOP taking these medications   IMODIUM A-D 2 MG tablet Generic drug:  loperamide     TAKE these medications   acetaminophen 325 MG tablet Commonly known as:  TYLENOL Take 2 tablets (650 mg total) by mouth every 6 (six) hours as needed. What changed:  when to take this   albuterol 108 (90 Base) MCG/ACT inhaler Commonly known as:  PROVENTIL HFA;VENTOLIN HFA Inhale 2 puffs into the lungs every 4 (four) hours as needed for wheezing or shortness of breath.   amLODipine 5 MG tablet Commonly known as:  NORVASC Take 1 tablet (5 mg total) by mouth daily.   betaxolol 0.5 % ophthalmic suspension Commonly known as:  BETOPTIC-S Place 1 drop into both eyes 2 (two) times daily.   citalopram 10 MG tablet Commonly known as:  CELEXA Take 1 tablet (10 mg total) by mouth daily for 2 days.   clindamycin 300 MG capsule Commonly known as:  CLEOCIN Take 1 capsule (300 mg total) by mouth every 8 (eight) hours for 4 days.   dipyridamole-aspirin 200-25 MG 12hr capsule Commonly known as:  AGGRENOX TAKE 1 CAPSULE BY MOUTH 2 (TWO) TIMES DAILY. What changed:    how much to take  how to take this  when to take this  additional instructions   donepezil 10 MG tablet Commonly known as:  ARICEPT Take 1 tablet (10 mg total) by mouth at bedtime.   gabapentin 100 MG capsule Commonly known as:  NEURONTIN TAKE 1 CAPSULE (100 MG TOTAL) BY MOUTH AT BEDTIME. What changed:    how much to take  how to take this  when to take this  additional instructions   guaiFENesin 600 MG 12 hr tablet Commonly known as:  MUCINEX Take 600 mg by mouth every 12 (twelve) hours.   ipratropium 0.02 % nebulizer solution Commonly known as:   ATROVENT Take 2.5 mLs (0.5 mg total) by nebulization every 6 (six) hours as needed for wheezing or shortness of breath.   latanoprost 0.005 % ophthalmic solution Commonly known as:  XALATAN Place 1 drop into both eyes at bedtime.   levothyroxine 75 MCG tablet Commonly known as:  SYNTHROID, LEVOTHROID Take 0.5 tablets (37.5 mcg total) by mouth daily.   OLANZapine 2.5 MG tablet Commonly known as:  ZYPREXA Take 1 tablet (2.5 mg total) by mouth every evening.   potassium chloride SA 20 MEQ tablet Commonly known as:  K-DUR,KLOR-CON Take 2 tablets (40 mEq total) by mouth 2 (two) times daily.      Allergies  Allergen Reactions  . Ciprofloxacin Hives    Given with 2 other meds/unsure if truly allergic  . Combigan [Brimonidine Tartrate-Timolol] Swelling    Inflammation  . Detrol [Tolterodine Tartrate] Hives    Given with 2 other medications/unsure if truly allergic  . Oxycodone Hives    Given with 2 other medications/unsure if truly allergic  . Penicillins Hives, Itching and Rash    DID THE REACTION INVOLVE: Swelling of the face/tongue/throat, SOB, or low BP? Unknown Sudden or severe rash/hives, skin peeling, or the inside of the mouth or nose? Unknown Did it  require medical treatment? Unknown When did it last happen?Unknown If all above answers are "NO", may proceed with cephalosporin use.  . Sulfa Antibiotics Hives, Itching and Rash   Follow-up Information    [facility] Follow up.   Specialty:  Home Health Services Why:  nursing/physical therapy/occupational therapy/aide/social worker Contact information: [address] [place] [address] [phone]            The results of significant diagnostics from this hospitalization (including imaging, microbiology, ancillary and laboratory) are listed below for reference.    Significant Diagnostic Studies: Dg Chest 2 View  Result Date: 05/24/2018 CLINICAL DATA:  Dyspnea with increasing  lethargy and fever for a few days. EXAM: CHEST - 2 VIEW COMPARISON:  None. FINDINGS: Normal heart size and mediastinal contours with aortic atherosclerosis. Slight uncoiling of the thoracic aorta is identified. Patchy pulmonary consolidation in the right upper lobe distribution is noted consistent with pneumonia. Left lung appears clear. No acute osseous abnormality is noted. IMPRESSION: Right upper lobe pneumonia. Followup PA and lateral chest X-ray is recommended in 3-4 weeks following trial of antibiotic therapy to ensure resolution and exclude underlying malignancy. Electronically Signed   By: Tollie Eth M.D.   On: 05/24/2018 14:53   Ct Angio Chest Pe W Or Wo Contrast  Result Date: 05/24/2018 CLINICAL DATA:  Shortness of breath and lethargy EXAM: CT ANGIOGRAPHY CHEST WITH CONTRAST TECHNIQUE: Multidetector CT imaging of the chest was performed using the standard protocol during bolus administration of intravenous contrast. Multiplanar CT image reconstructions and MIPs were obtained to evaluate the vascular anatomy. Examination is somewhat limited by patient motion artifact. CONTRAST:  ISOVUE-370 IOPAMIDOL (ISOVUE-370) INJECTION 76% COMPARISON:  None. FINDINGS: Cardiovascular: Thoracic aorta demonstrates diffuse atherosclerotic calcifications without aneurysmal dilatation or dissection. No cardiac enlargement is seen. Mild coronary calcifications are noted. The pulmonary artery is well visualized centrally without evidence pulmonary emboli. The peripheral branches are somewhat bolus as well as patient motion artifact during the exam. Mediastinum/Nodes: The esophagus is within normal limits. No hilar or mediastinal adenopathy is seen. The thoracic inlet is unremarkable. Lungs/Pleura: Left lung is well aerated without focal infiltrate or sizable effusion. The right lung demonstrates multifocal infiltrate consistent with acute pneumonia particularly within the right upper lobe but to a lesser degree in  the right lower lobe. Small right effusion is seen. Upper Abdomen: Visualized upper abdomen is within normal limits. Musculoskeletal: Degenerative changes of the thoracic spine noted. Review of the MIP images confirms the above findings. IMPRESSION: No definitive pulmonary emboli are seen although peripheral branches are somewhat limited due to patient motion artifact. Patchy multifocal infiltrates involving the right upper and right lower lobes. Small right pleural effusion. Aortic Atherosclerosis (ICD10-I70.0). Electronically Signed   By: Alcide Clever M.D.   On: 05/24/2018 21:47   Dg Swallowing Func-speech Pathology  Result Date: 05/28/2018 Objective Swallowing Evaluation: Type of Study: MBS-Modified Barium Swallow Study  Patient Details Name: TAELYN BROECKER MRN: [id number] Date of Birth: 06/18/1926 Today's Date: 05/28/2018 Time: SLP Start Time (ACUTE ONLY): 1235 -SLP Stop Time (ACUTE ONLY): 1255 SLP Time Calculation (min) (ACUTE ONLY): 20 min Past Medical History: Past Medical History: Diagnosis Date . Aortic atherosclerosis (HCC) 05/26/2018 . Chronic kidney disease 1998  stones . Depression with anxiety 09/14/2014 . Dyspnea  . Fibrocystic breast 01/12/2011 . Glaucoma   both eyes . History of chicken pox  . Hypertension  . Insomnia 01/13/2011 . Kidney stone   stones  . Memory loss 10/12/2012 .  Nausea 05/31/2012 . Spinal stenosis 06/14/2011 . Stroke (HCC) 04/2007  x 3 total . Thyroid disease   hypothyroid . Urinary frequency 02/09/2011 . Vertigo 02/09/2011 Past Surgical History: Past Surgical History: Procedure Laterality Date . ABDOMINAL HYSTERECTOMY  1963 . BACK SURGERY  1989  discectomy L4-5 1989, good results . BREAST SURGERY  1970  biopsy, benign, b/l HPI: [age] y.o. female with Past medical history of CVA, dementia, hypothyroidism, depression, HTN. Admitted with pna  Subjective: alert Assessment / Plan / Recommendation CHL IP CLINICAL IMPRESSIONS 05/28/2018 Clinical Impression Pt presents with functional  oropharyngeal swallow marked by adequate mastication, reliable laryngeal vestibule closure with no penetration/aspiration; no residue post-swallow with normal pharyngeal squeeze.  Material passed through UES with no resistance.  No indication of a dysphagia-related aspiration pna.  Continue regular diet, thin liquids; no SLP f/u warranted.  SLP Visit Diagnosis Dysphagia, unspecified (R13.10) Attention and concentration deficit following -- Frontal lobe and executive function deficit following -- Impact on safety and function No limitations   CHL IP TREATMENT RECOMMENDATION 05/28/2018 Treatment Recommendations No treatment recommended at this time   Prognosis 05/25/2018 Prognosis for Safe Diet Advancement Good Barriers to Reach Goals Cognitive deficits Barriers/Prognosis Comment -- CHL IP DIET RECOMMENDATION 05/28/2018 SLP Diet Recommendations Regular solids;Thin liquid Liquid Administration via Straw;Cup Medication Administration Whole meds with liquid Compensations -- Postural Changes --   CHL IP OTHER RECOMMENDATIONS 05/28/2018 Recommended Consults -- Oral Care Recommendations Oral care BID Other Recommendations --   CHL IP FOLLOW UP RECOMMENDATIONS 05/28/2018 Follow up Recommendations None   CHL IP FREQUENCY AND DURATION 05/25/2018 Speech Therapy Frequency (ACUTE ONLY) min 1 x/week Treatment Duration 1 week      CHL IP ORAL PHASE 05/28/2018 Oral Phase WFL Oral - Pudding Teaspoon -- Oral - Pudding Cup -- Oral - Honey Teaspoon -- Oral - Honey Cup -- Oral - Nectar Teaspoon -- Oral - Nectar Cup -- Oral - Nectar Straw -- Oral - Thin Teaspoon -- Oral - Thin Cup -- Oral - Thin Straw -- Oral - Puree -- Oral - Mech Soft -- Oral - Regular -- Oral - Multi-Consistency -- Oral - Pill -- Oral Phase - Comment --  CHL IP PHARYNGEAL PHASE 05/28/2018 Pharyngeal Phase WFL Pharyngeal- Pudding Teaspoon -- Pharyngeal -- Pharyngeal- Pudding Cup -- Pharyngeal -- Pharyngeal- Honey Teaspoon -- Pharyngeal -- Pharyngeal- Honey Cup --  Pharyngeal -- Pharyngeal- Nectar Teaspoon -- Pharyngeal -- Pharyngeal- Nectar Cup -- Pharyngeal -- Pharyngeal- Nectar Straw -- Pharyngeal -- Pharyngeal- Thin Teaspoon -- Pharyngeal -- Pharyngeal- Thin Cup -- Pharyngeal -- Pharyngeal- Thin Straw -- Pharyngeal -- Pharyngeal- Puree -- Pharyngeal -- Pharyngeal- Mechanical Soft -- Pharyngeal -- Pharyngeal- Regular -- Pharyngeal -- Pharyngeal- Multi-consistency -- Pharyngeal -- Pharyngeal- Pill -- Pharyngeal -- Pharyngeal Comment --  CHL IP CERVICAL ESOPHAGEAL PHASE 05/28/2018 Cervical Esophageal Phase WFL Pudding Teaspoon -- Pudding Cup -- Honey Teaspoon -- Honey Cup -- Nectar Teaspoon -- Nectar Cup -- Nectar Straw -- Thin Teaspoon -- Thin Cup -- Thin Straw -- Puree -- Mechanical Soft -- Regular -- Multi-consistency -- Pill -- Cervical Esophageal Comment -- Blenda MountsCouture, Amanda Laurice 05/28/2018, 12:57 PM               Microbiology: Recent Results (from the past 240 hour(s))  Blood culture (routine x 2)     Status: None   Collection Time: 05/24/18  3:26 PM  Result Value Ref Range Status   Specimen Description   Final    BLOOD LEFT ANTECUBITAL Performed at [facility],  [address] [place], [place] [address]    Special Requests   Final    BOTTLES DRAWN AEROBIC AND ANAEROBIC Blood Culture results may not be optimal due to an excessive volume of blood received in culture bottles Performed at [facility], [address] [place], [place] [address]    Culture   Final    NO GROWTH 5 DAYS Performed at [facility], [address] [place], [place] [address]    Report Status 05/29/2018 FINAL  Final  Blood culture (routine x 2)     Status: None   Collection Time: 05/24/18  3:33 PM  Result Value Ref Range Status   Specimen Description   Final    BLOOD RIGHT ARM Performed at [facility], [address] [place], [place] [address]    Special Requests   Final    BOTTLES DRAWN AEROBIC AND  ANAEROBIC Blood Culture results may not be optimal due to an inadequate volume of blood received in culture bottles Performed at [facility], [address] [place], [place] [address]    Culture   Final    NO GROWTH 5 DAYS Performed at [facility], [address] [place], [place] [address]    Report Status 05/29/2018 FINAL  Final  MRSA PCR Screening     Status: None   Collection Time: 05/24/18  6:11 PM  Result Value Ref Range Status   MRSA by PCR NEGATIVE NEGATIVE Final    Comment:        The GeneXpert MRSA Assay (FDA approved for NASAL specimens only), is one component of a comprehensive MRSA colonization surveillance program. It is not intended to diagnose MRSA infection nor to guide or monitor treatment for MRSA infections. Performed at [facility], [address] [place], [place] [address]      Labs: Basic Metabolic Panel: Recent Labs  Lab 05/24/18 1546 05/25/18 0532 05/29/18 0836 05/30/18 0541  NA 140 141 140 137  K 3.5 2.9* 2.8* 3.6  CL 107 111 104 103  CO2 24 20* 27 26  GLUCOSE 96 83 98 106*  BUN 24* 14 <5* <5*  CREATININE 0.94 0.76 0.60 0.62  CALCIUM 8.2* 7.7* 7.9* 8.0*  MG  --   --   --  2.1   Liver Function Tests: Recent Labs  Lab 05/25/18 0532 05/30/18 0541  AST 13* 20  ALT 12 16  ALKPHOS 68 59  BILITOT 0.8 0.5  PROT 5.4* 6.0*  ALBUMIN 2.3* 2.3*   No results for input(s): LIPASE, AMYLASE in the last 168 hours. No results for input(s): AMMONIA in the last 168 hours. CBC: Recent Labs  Lab 05/24/18 1546 05/25/18 0532 05/30/18 0541  WBC 10.8* 9.7 9.0  NEUTROABS 8.0* 6.8 6.5  HGB 12.5 11.0* 11.2*  HCT 39.4 34.9* 34.5*  MCV 100.0 102.9* 100.3*  PLT 266 240 332   Cardiac Enzymes: No results for input(s): CKTOTAL, CKMB, CKMBINDEX, TROPONINI in the last 168 hours. BNP: BNP (last 3 results) Recent Labs    01/12/18 0843  BNP 41.5    ProBNP (last 3 results) No results for input(s):  PROBNP in the last 8760 hours.  CBG: No results for input(s): GLUCAP in the last 168 hours.     Signed:  Rhetta MuraJai-Gurmukh Tarissa Kerin MD   [facility] 05/30/2018, 10:25 AM

## 2018-05-30 NOTE — [place] FL2 (Addendum)
[place] MEDICAID FL2 LEVEL OF CARE SCREENING TOOL     IDENTIFICATION  Patient Name: Darlene Wilson Birthdate: September 17, 1926 Sex: female Admission Date (Current Location): 05/24/2018  [facility] [place]   Facility and Address:  [facility],  [address], [place] [id number]      Provider Number: [id number]  Attending Physician Name and Address:  Rhetta MuraSamtani, Jai-Gurmukh, MD  Relative Name and Phone Number:       Current Level of Care: Hospital Recommended Level of Care: Memory Care Prior Approval Number:    Date Approved/Denied:   PASRR Number:    Discharge Plan: Other (Comment)(Memory Care)    Current Diagnoses: Patient Active Problem List   Diagnosis Date Noted  . Lobar pneumonia (HCC) 05/26/2018  . Aortic atherosclerosis (HCC) 05/26/2018  . Alzheimer disease (HCC) 08/10/2016  . Malnutrition of moderate degree 03/10/2015  . Syncope 03/08/2015  . Glaucoma 03/08/2015  . Hearing loss 09/14/2014  . Depression with anxiety 09/14/2014  . Medicare annual wellness visit, subsequent 04/13/2014  . Hyperglycemia 10/06/2013  . Neck pain 11/20/2012  . Dizziness and giddiness 11/08/2012  . Degeneration of cervical intervertebral disc 11/08/2012  . Cervical spondylosis without myelopathy 11/08/2012  . Other malaise and fatigue 11/08/2012  . Disturbance of skin sensation 11/08/2012  . Hemiplegia of dominant side as late effect following cerebrovascular disease (HCC) 11/08/2012  . Other late effects of cerebrovascular disease(438.89) 11/08/2012  . Memory loss 10/12/2012  . Spinal stenosis 06/14/2011  . Urinary frequency 02/09/2011  . Vertigo 02/09/2011  . Insomnia 01/13/2011  . Fibrocystic breast 01/12/2011  . Hypertension   . Kidney stone   . Thyroid disease   . Stroke (HCC) 04/30/2007    Orientation RESPIRATION BLADDER Height & Weight     Self  Normal Incontinent, External catheter Weight: 138 lb 10.7 oz (62.9 kg) Height:  5' 4.8"  (164.6 cm)  BEHAVIORAL SYMPTOMS/MOOD NEUROLOGICAL BOWEL NUTRITION STATUS      Incontinent No added Salt   AMBULATORY STATUS COMMUNICATION OF NEEDS Skin   Limited Assist Verbally Normal                       Personal Care Assistance Level of Assistance  Bathing, Feeding, Dressing Bathing Assistance: Limited assistance Feeding assistance: Limited assistance Dressing Assistance: Limited assistance     Functional Limitations Info             SPECIAL CARE FACTORS FREQUENCY  PT (By licensed PT)     PT Frequency: Home Health               Contractures      Additional Factors Info  Code Status Code Status Info: DNR             Current Medications (05/30/2018):  This is the current hospital active medication list Current Facility-Administered Medications  Medication Dose Route Frequency Provider Last Rate Last Dose  . betaxolol (BETOPTIC-S) 0.5 % ophthalmic solution 1 drop  1 drop Both Eyes BID Rolly SalterPatel, Pranav M, MD   1 drop at 05/29/18 2139  . citalopram (CELEXA) tablet 10 mg  10 mg Oral Daily Rolly SalterPatel, Pranav M, MD   10 mg at 05/29/18 1043  . clindamycin (CLEOCIN) capsule 300 mg  300 mg Oral Q8H Samtani, Jai-Gurmukh, MD   300 mg at 05/30/18 0600  . dipyridamole-aspirin (AGGRENOX) 200-25 MG per 12 hr capsule 1 capsule  1 capsule Oral BID Rolly SalterPatel, Pranav M, MD   1 capsule  at 05/29/18 2139  . donepezil (ARICEPT) tablet 10 mg  10 mg Oral QHS Rolly Salter, MD   10 mg at 05/29/18 2139  . enoxaparin (LOVENOX) injection 40 mg  40 mg Subcutaneous Q24H Rolly Salter, MD   40 mg at 05/29/18 2139  . gabapentin (NEURONTIN) capsule 100 mg  100 mg Oral QHS Rolly Salter, MD   100 mg at 05/29/18 2139  . guaiFENesin (MUCINEX) 12 hr tablet 600 mg  600 mg Oral Q12H Rolly Salter, MD   600 mg at 05/29/18 2138  . guaiFENesin-dextromethorphan (ROBITUSSIN DM) 100-10 MG/5ML syrup 5 mL  5 mL Oral Q4H PRN Standley Brooking, MD   5 mL at 05/27/18 0225  . ipratropium (ATROVENT) nebulizer  solution 0.5 mg  0.5 mg Nebulization Q6H PRN Rhetta Mura, MD      . latanoprost (XALATAN) 0.005 % ophthalmic solution 1 drop  1 drop Both Eyes QHS Rolly Salter, MD   1 drop at 05/29/18 2139  . levothyroxine (SYNTHROID, LEVOTHROID) tablet 37.5 mcg  37.5 mcg Oral QAC breakfast Rolly Salter, MD   37.5 mcg at 05/30/18 0600  . OLANZapine (ZYPREXA) tablet 2.5 mg  2.5 mg Oral QPM Rolly Salter, MD   2.5 mg at 05/29/18 1630  . potassium chloride SA (K-DUR,KLOR-CON) CR tablet 40 mEq  40 mEq Oral BID Rhetta Mura, MD   40 mEq at 05/29/18 2137     Discharge Medications: Medication List    STOP taking these medications   IMODIUM A-D 2 MG tablet Generic drug:  loperamide     TAKE these medications   acetaminophen 325 MG tablet Commonly known as:  TYLENOL Take 2 tablets (650 mg total) by mouth every 6 (six) hours as needed. What changed:  when to take this   albuterol 108 (90 Base) MCG/ACT inhaler Commonly known as:  PROVENTIL HFA;VENTOLIN HFA Inhale 2 puffs into the lungs every 4 (four) hours as needed for wheezing or shortness of breath.   amLODipine 5 MG tablet Commonly known as:  NORVASC Take 1 tablet (5 mg total) by mouth daily.   betaxolol 0.5 % ophthalmic suspension Commonly known as:  BETOPTIC-S Place 1 drop into both eyes 2 (two) times daily.   citalopram 10 MG tablet Commonly known as:  CELEXA Take 1 tablet (10 mg total) by mouth daily for 2 days.   clindamycin 300 MG capsule Commonly known as:  CLEOCIN Take 1 capsule (300 mg total) by mouth every 8 (eight) hours for 4 days.   dipyridamole-aspirin 200-25 MG 12hr capsule Commonly known as:  AGGRENOX TAKE 1 CAPSULE BY MOUTH 2 (TWO) TIMES DAILY. What changed:    how much to take  how to take this  when to take this  additional instructions   donepezil 10 MG tablet Commonly known as:  ARICEPT Take 1 tablet (10 mg total) by mouth at bedtime.   gabapentin 100 MG capsule Commonly  known as:  NEURONTIN TAKE 1 CAPSULE (100 MG TOTAL) BY MOUTH AT BEDTIME. What changed:    how much to take  how to take this  when to take this  additional instructions   guaiFENesin 600 MG 12 hr tablet Commonly known as:  MUCINEX Take 600 mg by mouth every 12 (twelve) hours.   ipratropium 0.02 % nebulizer solution Commonly known as:  ATROVENT Take 2.5 mLs (0.5 mg total) by nebulization every 6 (six) hours as needed for wheezing or shortness of breath.   latanoprost  0.005 % ophthalmic solution Commonly known as:  XALATAN Place 1 drop into both eyes at bedtime.   levothyroxine 75 MCG tablet Commonly known as:  SYNTHROID, LEVOTHROID Take 0.5 tablets (37.5 mcg total) by mouth daily.   OLANZapine 2.5 MG tablet Commonly known as:  ZYPREXA Take 1 tablet (2.5 mg total) by mouth every evening.   potassium chloride SA 20 MEQ tablet Commonly known as:  K-DUR,KLOR-CON Take 2 tablets (40 mEq total) by mouth 2 (two) times daily.        Relevant Imaging Results:  Relevant Lab Results:   Additional Information    Clearance Coots, LCSW

## 2018-05-30 NOTE — Final Progress Note (Signed)
NURSING PROGRESS NOTE  KAYLONNI MANK [id number] Discharge Data: 05/30/2018 12:13 PM Attending Provider: Rhetta Mura, MD IRC:VELFY, Bryon Lions, MD     [name] to be D/C'd [facility] per MD order.   All IV's discontinued with no bleeding noted. All belongings returned to patient for patient to take home. Report called and given to [facility], Charity fundraiser at [employer]. Social work has called [facility] and waiting on them to pick her up for transport.    Last Vital Signs:  Blood pressure (!) 148/72, pulse 86, temperature 99 F (37.2 C), resp. rate 18, height 5' 4.8" (1.646 m), weight 62.9 kg, SpO2 96 %.  Discharge Medication List Allergies as of 05/30/2018      Reactions   Ciprofloxacin Hives   Given with 2 other meds/unsure if truly allergic   Combigan [brimonidine Tartrate-timolol] Swelling   Inflammation   Detrol [tolterodine Tartrate] Hives   Given with 2 other medications/unsure if truly allergic   Oxycodone Hives   Given with 2 other medications/unsure if truly allergic   Penicillins Hives, Itching, Rash   DID THE REACTION INVOLVE: Swelling of the face/tongue/throat, SOB, or low BP? Unknown Sudden or severe rash/hives, skin peeling, or the inside of the mouth or nose? Unknown Did it require medical treatment? Unknown When did it last happen?Unknown If all above answers are "NO", may proceed with cephalosporin use.   Sulfa Antibiotics Hives, Itching, Rash      Medication List    STOP taking these medications   IMODIUM A-D 2 MG tablet Generic drug:  loperamide     TAKE these medications   acetaminophen 325 MG tablet Commonly known as:  TYLENOL Take 2 tablets (650 mg total) by mouth every 6 (six) hours as needed. What changed:  when to take this   albuterol 108 (90 Base) MCG/ACT inhaler Commonly known as:  PROVENTIL HFA;VENTOLIN HFA Inhale 2 puffs into the lungs every 4 (four) hours as needed for wheezing or shortness of breath.   amLODipine 5 MG  tablet Commonly known as:  NORVASC Take 1 tablet (5 mg total) by mouth daily.   betaxolol 0.5 % ophthalmic suspension Commonly known as:  BETOPTIC-S Place 1 drop into both eyes 2 (two) times daily.   citalopram 10 MG tablet Commonly known as:  CELEXA Take 1 tablet (10 mg total) by mouth daily for 2 days.   clindamycin 300 MG capsule Commonly known as:  CLEOCIN Take 1 capsule (300 mg total) by mouth every 8 (eight) hours for 4 days.   dipyridamole-aspirin 200-25 MG 12hr capsule Commonly known as:  AGGRENOX TAKE 1 CAPSULE BY MOUTH 2 (TWO) TIMES DAILY. What changed:    how much to take  how to take this  when to take this  additional instructions   donepezil 10 MG tablet Commonly known as:  ARICEPT Take 1 tablet (10 mg total) by mouth at bedtime.   gabapentin 100 MG capsule Commonly known as:  NEURONTIN TAKE 1 CAPSULE (100 MG TOTAL) BY MOUTH AT BEDTIME. What changed:    how much to take  how to take this  when to take this  additional instructions   guaiFENesin 600 MG 12 hr tablet Commonly known as:  MUCINEX Take 600 mg by mouth every 12 (twelve) hours.   ipratropium 0.02 % nebulizer solution Commonly known as:  ATROVENT Take 2.5 mLs (0.5 mg total) by nebulization every 6 (six) hours as needed for wheezing or shortness of breath.   latanoprost 0.005 %  ophthalmic solution Commonly known as:  XALATAN Place 1 drop into both eyes at bedtime.   levothyroxine 75 MCG tablet Commonly known as:  SYNTHROID, LEVOTHROID Take 0.5 tablets (37.5 mcg total) by mouth daily.   OLANZapine 2.5 MG tablet Commonly known as:  ZYPREXA Take 1 tablet (2.5 mg total) by mouth every evening.   potassium chloride SA 20 MEQ tablet Commonly known as:  K-DUR,KLOR-CON Take 2 tablets (40 mEq total) by mouth 2 (two) times daily.

## 2018-06-01 ENCOUNTER — Telehealth: Payer: Self-pay | Admitting: Family Medicine

## 2018-06-01 NOTE — Telephone Encounter (Signed)
Copied from CRM [id number]. Topic: Quick Communication - Home Health Verbal Orders >> Jun 01, 2018 10:02 AM Windy KalataMichael, Rainelle Sulewski L, NT wrote: Caller/Agency: [facility] Callback Number: [phone] Requesting OT/PT/Skilled Nursing/Social Work: RN, PT, and OT / she states that her medication has stayed the same and pneumonia was treated in the hospital so she is unsure if she still needs RN evaluation. Frequency: Evaluation

## 2018-06-01 NOTE — Telephone Encounter (Signed)
[facility] called and stated that upon patient's discharge that she will be returning to her 24-hour Memory Care center and that they had asked for SN and PT, but now would like to ammend the order and only have PT. Gave VO via phone and asked for hard copy to be sent; provider informed/SLS 01/03

## 2018-06-08 ENCOUNTER — Inpatient Hospital Stay (HOSPITAL_COMMUNITY)
Admission: EM | Admit: 2018-06-08 | Discharge: 2018-06-14 | DRG: 378 | Disposition: A | Discharge status: Skilled Nursing Facility | Payer: Medicare Other | Source: Skilled Nursing Facility | Attending: Internal Medicine | Admitting: Internal Medicine

## 2018-06-08 ENCOUNTER — Encounter (HOSPITAL_COMMUNITY): Payer: Self-pay

## 2018-06-08 DIAGNOSIS — Z880 Allergy status to penicillin: Secondary | ICD-10-CM

## 2018-06-08 DIAGNOSIS — Z8249 Family history of ischemic heart disease and other diseases of the circulatory system: Secondary | ICD-10-CM

## 2018-06-08 DIAGNOSIS — I700 Atherosclerosis of aorta: Secondary | ICD-10-CM | POA: Diagnosis present

## 2018-06-08 DIAGNOSIS — F0280 Dementia in other diseases classified elsewhere without behavioral disturbance: Secondary | ICD-10-CM | POA: Diagnosis present

## 2018-06-08 DIAGNOSIS — K922 Gastrointestinal hemorrhage, unspecified: Secondary | ICD-10-CM | POA: Diagnosis not present

## 2018-06-08 DIAGNOSIS — E039 Hypothyroidism, unspecified: Secondary | ICD-10-CM | POA: Diagnosis present

## 2018-06-08 DIAGNOSIS — Z881 Allergy status to other antibiotic agents status: Secondary | ICD-10-CM

## 2018-06-08 DIAGNOSIS — Z885 Allergy status to narcotic agent status: Secondary | ICD-10-CM

## 2018-06-08 DIAGNOSIS — Z818 Family history of other mental and behavioral disorders: Secondary | ICD-10-CM

## 2018-06-08 DIAGNOSIS — E079 Disorder of thyroid, unspecified: Secondary | ICD-10-CM | POA: Diagnosis present

## 2018-06-08 DIAGNOSIS — I959 Hypotension, unspecified: Secondary | ICD-10-CM | POA: Diagnosis present

## 2018-06-08 DIAGNOSIS — Z66 Do not resuscitate: Secondary | ICD-10-CM | POA: Diagnosis present

## 2018-06-08 DIAGNOSIS — G309 Alzheimer's disease, unspecified: Secondary | ICD-10-CM | POA: Diagnosis present

## 2018-06-08 DIAGNOSIS — K5711 Diverticulosis of small intestine without perforation or abscess with bleeding: Secondary | ICD-10-CM | POA: Diagnosis not present

## 2018-06-08 DIAGNOSIS — Z8673 Personal history of transient ischemic attack (TIA), and cerebral infarction without residual deficits: Secondary | ICD-10-CM

## 2018-06-08 DIAGNOSIS — N182 Chronic kidney disease, stage 2 (mild): Secondary | ICD-10-CM | POA: Diagnosis present

## 2018-06-08 DIAGNOSIS — I639 Cerebral infarction, unspecified: Secondary | ICD-10-CM | POA: Diagnosis present

## 2018-06-08 DIAGNOSIS — I10 Essential (primary) hypertension: Secondary | ICD-10-CM | POA: Diagnosis present

## 2018-06-08 DIAGNOSIS — Z833 Family history of diabetes mellitus: Secondary | ICD-10-CM

## 2018-06-08 DIAGNOSIS — F418 Other specified anxiety disorders: Secondary | ICD-10-CM | POA: Diagnosis present

## 2018-06-08 DIAGNOSIS — I129 Hypertensive chronic kidney disease with stage 1 through stage 4 chronic kidney disease, or unspecified chronic kidney disease: Secondary | ICD-10-CM | POA: Diagnosis present

## 2018-06-08 DIAGNOSIS — Z888 Allergy status to other drugs, medicaments and biological substances status: Secondary | ICD-10-CM

## 2018-06-08 DIAGNOSIS — Z90710 Acquired absence of both cervix and uterus: Secondary | ICD-10-CM

## 2018-06-08 DIAGNOSIS — D62 Acute posthemorrhagic anemia: Secondary | ICD-10-CM | POA: Diagnosis present

## 2018-06-08 DIAGNOSIS — N6019 Diffuse cystic mastopathy of unspecified breast: Secondary | ICD-10-CM | POA: Diagnosis present

## 2018-06-08 DIAGNOSIS — H409 Unspecified glaucoma: Secondary | ICD-10-CM | POA: Diagnosis present

## 2018-06-08 DIAGNOSIS — E860 Dehydration: Secondary | ICD-10-CM | POA: Diagnosis present

## 2018-06-08 DIAGNOSIS — Z79890 Hormone replacement therapy (postmenopausal): Secondary | ICD-10-CM

## 2018-06-08 DIAGNOSIS — Z87442 Personal history of urinary calculi: Secondary | ICD-10-CM

## 2018-06-08 DIAGNOSIS — Z882 Allergy status to sulfonamides status: Secondary | ICD-10-CM

## 2018-06-08 LAB — COMPREHENSIVE METABOLIC PANEL
ALT: 14 U/L (ref 0–44)
AST: 18 U/L (ref 15–41)
Albumin: 2.4 g/dL — ABNORMAL LOW (ref 3.5–5.0)
Alkaline Phosphatase: 52 U/L (ref 38–126)
Anion gap: 7 (ref 5–15)
BUN: 20 mg/dL (ref 8–23)
CO2: 22 mmol/L (ref 22–32)
Calcium: 8.6 mg/dL — ABNORMAL LOW (ref 8.9–10.3)
Chloride: 107 mmol/L (ref 98–111)
Creatinine, Ser: 0.96 mg/dL (ref 0.44–1.00)
GFR calc Af Amer: >60 mL/min (ref >60)
GFR calc non Af Amer: 52 mL/min — ABNORMAL LOW (ref >60)
Glucose, Bld: 160 mg/dL — ABNORMAL HIGH (ref 70–99)
Potassium: 4.6 mmol/L (ref 3.5–5.1)
Sodium: 136 mmol/L (ref 135–145)
Total Bilirubin: 0.4 mg/dL (ref 0.3–1.2)
Total Protein: 5.7 g/dL — ABNORMAL LOW (ref 6.5–8.1)

## 2018-06-08 LAB — I-STAT CHEM 8, ED
BUN: 20 mg/dL (ref 8–23)
Calcium, Ion: 1.17 mmol/L (ref 1.15–1.40)
Chloride: 107 mmol/L (ref 98–111)
Creatinine, Ser: 0.9 mg/dL (ref 0.44–1.00)
GLUCOSE: 160 mg/dL — AB (ref 70–99)
HCT: 32 % — ABNORMAL LOW (ref 36.0–46.0)
Hemoglobin: 10.9 g/dL — ABNORMAL LOW (ref 12.0–15.0)
Potassium: 4.6 mmol/L (ref 3.5–5.1)
Sodium: 137 mmol/L (ref 135–145)
TCO2: 22 mmol/L (ref 22–32)

## 2018-06-08 LAB — POC OCCULT BLOOD, ED: Fecal Occult Bld: POSITIVE — AB

## 2018-06-08 LAB — CBC
HCT: 33.7 % — ABNORMAL LOW (ref 36.0–46.0)
Hemoglobin: 10.4 g/dL — ABNORMAL LOW (ref 12.0–15.0)
MCH: 31.8 pg (ref 26.0–34.0)
MCHC: 30.9 g/dL (ref 30.0–36.0)
MCV: 103.1 fL — ABNORMAL HIGH (ref 80.0–100.0)
NRBC: 0 % (ref 0.0–0.2)
PLATELETS: 496 10*3/uL — AB (ref 150–400)
RBC: 3.27 MIL/uL — ABNORMAL LOW (ref 3.87–5.11)
RDW: 14.8 % (ref 11.5–15.5)
WBC: 7.7 10*3/uL (ref 4.0–10.5)

## 2018-06-08 LAB — ABO/RH: ABO/RH(D): O NEG

## 2018-06-08 MED ORDER — SODIUM CHLORIDE 0.9 % IV SOLN
80.0000 mg | Freq: Once | INTRAVENOUS | Status: AC
  Administered 2018-06-08: 80 mg via INTRAVENOUS
  Filled 2018-06-08: qty 80

## 2018-06-08 MED ORDER — BETAXOLOL HCL 0.5 % OP SOLN
1.0000 [drp] | Freq: Two times a day (BID) | OPHTHALMIC | Status: DC
  Administered 2018-06-09 – 2018-06-14 (×10): 1 [drp] via OPHTHALMIC
  Filled 2018-06-08 (×3): qty 5

## 2018-06-08 MED ORDER — IPRATROPIUM-ALBUTEROL 0.5-2.5 (3) MG/3ML IN SOLN: 3.0000 mL | Freq: Four times a day (QID) | RESPIRATORY_TRACT | Status: DC | PRN

## 2018-06-08 MED ORDER — SODIUM CHLORIDE 0.9 % IV BOLUS
1000.0000 mL | Freq: Once | INTRAVENOUS | Status: AC
  Administered 2018-06-08: 1000 mL via INTRAVENOUS

## 2018-06-08 MED ORDER — SODIUM CHLORIDE 0.9 % IV SOLN
8.0000 mg/h | INTRAVENOUS | Status: DC
  Administered 2018-06-08 – 2018-06-10 (×4): 8 mg/h via INTRAVENOUS
  Filled 2018-06-08 (×6): qty 80

## 2018-06-08 MED ORDER — LATANOPROST 0.005 % OP SOLN
1.0000 [drp] | Freq: Every day | OPHTHALMIC | Status: DC
  Administered 2018-06-09 – 2018-06-13 (×5): 1 [drp] via OPHTHALMIC
  Filled 2018-06-08 (×2): qty 2.5

## 2018-06-08 MED ORDER — LEVOTHYROXINE SODIUM 100 MCG/5ML IV SOLN
18.0000 ug | Freq: Every day | INTRAVENOUS | Status: DC
  Administered 2018-06-09 – 2018-06-10 (×2): 18 ug via INTRAVENOUS
  Filled 2018-06-08 (×3): qty 5

## 2018-06-08 MED ORDER — OLANZAPINE 10 MG IM SOLR
2.5000 mg | Freq: Every day | INTRAMUSCULAR | Status: DC
  Administered 2018-06-09 – 2018-06-13 (×5): 2.5 mg via INTRAMUSCULAR
  Filled 2018-06-08 (×6): qty 10

## 2018-06-08 MED ORDER — SODIUM CHLORIDE 0.9 % IV SOLN
INTRAVENOUS | Status: DC
  Administered 2018-06-09 – 2018-06-10 (×3): via INTRAVENOUS

## 2018-06-08 NOTE — ED Provider Notes (Signed)
[facility] EMERGENCY DEPARTMENT Provider Note   CSN: [id number] Arrival date & time: 06/08/18  1945     History   Chief Complaint Chief Complaint  Patient presents with  . GI Bleeding  . Altered Mental Status    HPI Darlene Wilson is a 85 y.o. female.  The history is provided by the patient, medical records and the EMS personnel. No language interpreter was used.  Altered Mental Status   Darlene Wilson is a 85 y.o. female  with a PMH as listed below including dementia, prior stroke on Aggrenox who presents to the Emergency Department from memory care unit at nursing facility for concerns of GI bleeding.  Per nursing home, patient had a bowel movement in bed today which was a dark, tarry stool, but also had bright red blood.  They checked her blood pressure and she was hypotensive.  They called EMS.  EMS reports systolic blood pressure of 76.  She was given 300 cc of normal saline in route with improvement of her blood pressure to 120's.  Patient denies any complaints and is unsure why she is in the hospital today.  Level V caveat applies 2/2 dementia   Past Medical History:  Diagnosis Date  . Aortic atherosclerosis (HCC) 05/26/2018  . Chronic kidney disease 1998   stones  . Depression with anxiety 09/14/2014  . Dyspnea   . Fibrocystic breast 01/12/2011  . Glaucoma    both eyes  . History of chicken pox   . Hypertension   . Insomnia 01/13/2011  . Kidney stone    stones   . Memory loss 10/12/2012  . Nausea 05/31/2012  . Spinal stenosis 06/14/2011  . Stroke (HCC) 04/2007   x 3 total  . Thyroid disease    hypothyroid  . Urinary frequency 02/09/2011  . Vertigo 02/09/2011    Patient Active Problem List   Diagnosis Date Noted  . GI bleed 06/08/2018  . Lobar pneumonia (HCC) 05/26/2018  . Aortic atherosclerosis (HCC) 05/26/2018  . Alzheimer disease (HCC) 08/10/2016  . Malnutrition of moderate degree 03/10/2015  . Syncope 03/08/2015  . Glaucoma  03/08/2015  . Hearing loss 09/14/2014  . Depression with anxiety 09/14/2014  . Medicare annual wellness visit, subsequent 04/13/2014  . Hyperglycemia 10/06/2013  . Neck pain 11/20/2012  . Dizziness and giddiness 11/08/2012  . Degeneration of cervical intervertebral disc 11/08/2012  . Cervical spondylosis without myelopathy 11/08/2012  . Other malaise and fatigue 11/08/2012  . Disturbance of skin sensation 11/08/2012  . Hemiplegia of dominant side as late effect following cerebrovascular disease (HCC) 11/08/2012  . Other late effects of cerebrovascular disease(438.89) 11/08/2012  . Memory loss 10/12/2012  . Spinal stenosis 06/14/2011  . Urinary frequency 02/09/2011  . Vertigo 02/09/2011  . Insomnia 01/13/2011  . Fibrocystic breast 01/12/2011  . Hypertension   . Kidney stone   . Thyroid disease   . Stroke [facility]) 04/30/2007    Past Surgical History:  Procedure Laterality Date  . ABDOMINAL HYSTERECTOMY  1963  . BACK SURGERY  1989   discectomy L4-5 1989, good results  . BREAST SURGERY  1970   biopsy, benign, b/l     OB History   No obstetric history on file.      Home Medications    Prior to Admission medications   Medication Sig Start Date End Date Taking? Authorizing Provider  acetaminophen (TYLENOL) 325 MG tablet Take 2 tablets (650 mg total) by mouth every 6 (six) hours as needed. Patient  taking differently: Take 650 mg by mouth daily.  11/21/12   Christiane Ha, MD  albuterol (PROVENTIL HFA;VENTOLIN HFA) 108 (90 Base) MCG/ACT inhaler Inhale 2 puffs into the lungs every 4 (four) hours as needed for wheezing or shortness of breath. Patient not taking: Reported on 05/24/2018 01/15/18   Shon Hale, MD  amLODipine (NORVASC) 5 MG tablet Take 1 tablet (5 mg total) by mouth daily. 01/15/18   Shon Hale, MD  betaxolol (BETOPTIC-S) 0.5 % ophthalmic suspension Place 1 drop into both eyes 2 (two) times daily. 01/23/18   [provider]  citalopram (CELEXA)  10 MG tablet Take 1 tablet (10 mg total) by mouth daily for 2 days. 05/30/18 06/01/18  Rhetta Mura, MD  dipyridamole-aspirin (AGGRENOX) 200-25 MG 12hr capsule TAKE 1 CAPSULE BY MOUTH 2 (TWO) TIMES DAILY. Patient taking differently: Take 1 capsule by mouth 2 (two) times daily.  01/15/18   Shon Hale, MD  donepezil (ARICEPT) 10 MG tablet Take 1 tablet (10 mg total) by mouth at bedtime. 01/23/18   [provider]  gabapentin (NEURONTIN) 100 MG capsule TAKE 1 CAPSULE (100 MG TOTAL) BY MOUTH AT BEDTIME. Patient taking differently: Take 100 mg by mouth at bedtime.  01/15/18   Shon Hale, MD  guaiFENesin (MUCINEX) 600 MG 12 hr tablet Take 600 mg by mouth every 12 (twelve) hours.    [provider]  ipratropium (ATROVENT) 0.02 % nebulizer solution Take 2.5 mLs (0.5 mg total) by nebulization every 6 (six) hours as needed for wheezing or shortness of breath. 05/30/18   Rhetta Mura, MD  latanoprost (XALATAN) 0.005 % ophthalmic solution Place 1 drop into both eyes at bedtime. 01/23/18   [provider]  levothyroxine (SYNTHROID, LEVOTHROID) 75 MCG tablet Take 0.5 tablets (37.5 mcg total) by mouth daily. 01/15/18   Shon Hale, MD  OLANZapine (ZYPREXA) 2.5 MG tablet Take 1 tablet (2.5 mg total) by mouth every evening. 05/30/18   Rhetta Mura, MD  potassium chloride SA (K-DUR,KLOR-CON) 20 MEQ tablet Take 2 tablets (40 mEq total) by mouth 2 (two) times daily. 05/30/18   Rhetta Mura, MD    Family History Family History  Problem Relation Age of Onset  . Heart disease Mother   . Hypertension Mother   . Diabetes Mother   . Heart disease Father   . Hypertension Father   . Cancer Sister        breast cancer  . Mental illness Brother        service connected    Social History Social History   Tobacco Use  . Smoking status: Never Smoker  . Smokeless tobacco: Never Used  Substance Use Topics  . Alcohol use: No  . Drug use: No      Allergies   Ciprofloxacin; Combigan [brimonidine tartrate-timolol]; Detrol [tolterodine tartrate]; Oxycodone; Penicillins; and Sulfa antibiotics   Review of Systems Review of Systems  Unable to perform ROS: Dementia  Gastrointestinal: Positive for blood in stool.     Physical Exam Updated Vital Signs BP (!) 96/41   Pulse 70   Temp 97.6 F (36.4 C) (Oral)   Resp 19   SpO2 97%   Physical Exam Vitals signs and nursing note reviewed.  Constitutional:      General: She is not in acute distress.    Appearance: She is well-developed.  HENT:     Head: Normocephalic and atraumatic.  Cardiovascular:     Rate and Rhythm: Normal rate and regular rhythm.     Heart sounds:  Normal heart sounds. No murmur.  Pulmonary:     Effort: Pulmonary effort is normal. No respiratory distress.     Breath sounds: Normal breath sounds.  Abdominal:     General: There is no distension.     Palpations: Abdomen is soft.     Tenderness: There is no abdominal tenderness.  Genitourinary:    Comments: No overt bleeding. Skin:    General: Skin is warm and dry.  Neurological:     Mental Status: She is alert.     Comments: Alert to person and place.      ED Treatments / Results  Labs (all labs ordered are listed, but only abnormal results are displayed) Labs Reviewed  COMPREHENSIVE METABOLIC PANEL - Abnormal; Notable for the following components:      Result Value   Glucose, Bld 160 (*)    Calcium 8.6 (*)    Total Protein 5.7 (*)    Albumin 2.4 (*)    GFR calc non Af Amer 52 (*)    All other components within normal limits  CBC - Abnormal; Notable for the following components:   RBC 3.27 (*)    Hemoglobin 10.4 (*)    HCT 33.7 (*)    MCV 103.1 (*)    Platelets 496 (*)    All other components within normal limits  POC OCCULT BLOOD, ED - Abnormal; Notable for the following components:   Fecal Occult Bld POSITIVE (*)    All other components within normal limits  I-STAT CHEM 8, ED -  Abnormal; Notable for the following components:   Glucose, Bld 160 (*)    Hemoglobin 10.9 (*)    HCT 32.0 (*)    All other components within normal limits  TYPE AND SCREEN  ABO/RH    EKG EKG Interpretation  Date/Time:  Friday June 08 2018 19:53:14 EST Ventricular Rate:  71 PR Interval:    QRS Duration: 77 QT Interval:  415 QTC Calculation: 451 R Axis:   16 Text Interpretation:  Sinus rhythm No significant change since last tracing Confirmed by Richardean Canal [phone]) on 06/08/2018 8:39:28 PM   Radiology No results found.  Procedures Procedures (including critical care time)  CRITICAL CARE Performed by: Chase Picket Kaysi Ourada   Total critical care time: 40 minutes  Critical care time was exclusive of separately billable procedures and treating other patients. Protonix drip, hypotensive, gi bleeding, multiple fluid bolus, multiple re-evaluations.   Critical care was necessary to treat or prevent imminent or life-threatening deterioration.  Critical care was time spent personally by me on the following activities: development of treatment plan with patient and/or surrogate as well as nursing, discussions with consultants, evaluation of patient's response to treatment, examination of patient, obtaining history from patient or surrogate, ordering and performing treatments and interventions, ordering and review of laboratory studies, ordering and review of radiographic studies, pulse oximetry and re-evaluation of patient's condition.   Medications Ordered in ED Medications  pantoprazole (PROTONIX) 80 mg in sodium chloride 0.9 % 250 mL (0.32 mg/mL) infusion (8 mg/hr Intravenous New Bag/Given 06/08/18 2153)  sodium chloride 0.9 % bolus 1,000 mL (1,000 mLs Intravenous New Bag/Given 06/08/18 2205)  sodium chloride 0.9 % bolus 1,000 mL (0 mLs Intravenous Stopped 06/08/18 2130)  pantoprazole (PROTONIX) 80 mg in sodium chloride 0.9 % 100 mL IVPB (0 mg Intravenous Stopped 06/08/18 2153)      Initial Impression / Assessment and Plan / ED Course  I have reviewed the triage vital signs and the  nursing notes.  Pertinent labs & imaging results that were available during my care of the patient were reviewed by me and considered in my medical decision making (see chart for details).    Frederik SchmidtJanie B Fonseca is a 85 y.o. female who presents to ED from facility for GI bleeding. Both melanotic stool and bright red blood per facility.  On my rectal examination, no signs of overt bleeding, does have specks of bright red blood in his stool.  Hypotensive, responding to fluids.  Started on Protonix drip.  Hemoglobin of 10.4.  8:55 PM - Spoke with GI, Dr. Bosie ClosSchooler. Recommends medical admission and GI will see in consultation.   10:06 PM -spoke with hospitalist who will admit.  Patient seen by and discussed with Dr. Silverio LayYao who agrees with treatment plan.    Final Clinical Impressions(s) / ED Diagnoses   Final diagnoses:  Gastrointestinal hemorrhage, unspecified gastrointestinal hemorrhage type    ED Discharge Orders    None       Pheonix Clinkscale, Chase PicketJaime Pilcher, PA-C 06/08/18 2207    Charlynne PanderYao, David Hsienta, MD 06/11/18 [phone]

## 2018-06-08 NOTE — ED Notes (Signed)
Fluids placed on pressure bag.

## 2018-06-08 NOTE — Progress Notes (Signed)
Patient admitted with the diagnosis of GI bleed. Disoriented to place, time and situation and as such could not complete admission profile. Profile will be completed when the family members get to the bedside. No s/s of pain or any acute distress noted. Will continue to monitor.

## 2018-06-08 NOTE — ED Notes (Signed)
Nurse at bedside and advised the 81/39 pressure is correct.

## 2018-06-08 NOTE — ED Triage Notes (Signed)
Pt comes via [facility] from [facility], pt had incontinent episode with bright red and dark tarry stools, according to staff she is also more altered then normal but unsure for how long. Pt from dementia ward. Hypotensive with EMS 76 systolic, pt is a DNR and has pink MOST form

## 2018-06-08 NOTE — ED Notes (Signed)
Attempted report 

## 2018-06-08 NOTE — H&P (Signed)
History and Physical    AURIAH Wilson JXB:[id number] DOB: July 11, 1926 DOA: 06/08/2018  PCP: [name], MD Patient coming from: Nursing home  Chief Complaint: GI bleed  HPI: Darlene Wilson is a 85 y.o. female with medical history significant of dementia, prior CVA on Aggrenox, depression, anxiety, hypertension, hypothyroidism presenting to the hospital from the memory care unit at her nursing home for concerns of GI bleed.  Per nursing home, patient had a bowel movement in bed today which was dark, tarry and also had bright red blood.  She was found to be hypotensive over there.  They called EMS.  EMS reported systolic blood pressure of 76.  She was given 300 cc normal saline in route with improvement of her blood pressure to 120s.  Patient is not sure why she is here and denies having any complaints.  She is not sure if she has had melena or hematochezia.  Denies having any abdominal pain, dizziness, chest pain, or shortness of breath.  No additional history could be obtained from her.  Review of Systems: As per HPI otherwise 10 point review of systems negative.  Past Medical History:  Diagnosis Date  . Aortic atherosclerosis (HCC) 05/26/2018  . Chronic kidney disease 1998   stones  . Depression with anxiety 09/14/2014  . Dyspnea   . Fibrocystic breast 01/12/2011  . Glaucoma    both eyes  . History of chicken pox   . Hypertension   . Insomnia 01/13/2011  . Kidney stone    stones   . Memory loss 10/12/2012  . Nausea 05/31/2012  . Spinal stenosis 06/14/2011  . Stroke (HCC) 04/2007   x 3 total  . Thyroid disease    hypothyroid  . Urinary frequency 02/09/2011  . Vertigo 02/09/2011    Past Surgical History:  Procedure Laterality Date  . ABDOMINAL HYSTERECTOMY  1963  . BACK SURGERY  1989   discectomy L4-5 1989, good results  . BREAST SURGERY  1970   biopsy, benign, b/l     reports that she has never smoked. She has never used smokeless tobacco. She reports that she does not  drink alcohol or use drugs.  Allergies  Allergen Reactions  . Ciprofloxacin Hives    "Allergic," per MAR  . Combigan [Brimonidine Tartrate-Timolol] Swelling    Inflammation; "Allergic," per MAR  . Detrol [Tolterodine Tartrate] Hives    "Allergic," per MAR  . Oxycodone Hives    "Allergic," per MAR  . Penicillins Hives, Itching and Rash    DID THE REACTION INVOLVE: Swelling of the face/tongue/throat, SOB, or low BP? Yes Sudden or severe rash/hives, skin peeling, or the inside of the mouth or nose? Unk Did it require medical treatment? Unk When did it last happen?Unk If all above answers are "NO", may proceed with cephalosporin use.  . Sulfa Antibiotics Hives, Itching and Rash    "Allergic," per [facility]    Family History  Problem Relation Age of Onset  . Heart disease Mother   . Hypertension Mother   . Diabetes Mother   . Heart disease Father   . Hypertension Father   . Cancer Sister        breast cancer  . Mental illness Brother        service connected    Prior to Admission medications   Medication Sig Start Date End Date Taking? Authorizing Provider  acetaminophen (TYLENOL) 325 MG tablet Take 2 tablets (650 mg total) by mouth every 6 (six) hours as needed.  Patient taking differently: Take 650 mg by mouth daily.  11/21/12   Christiane Ha, MD  albuterol (PROVENTIL HFA;VENTOLIN HFA) 108 (90 Base) MCG/ACT inhaler Inhale 2 puffs into the lungs every 4 (four) hours as needed for wheezing or shortness of breath. Patient not taking: Reported on 05/24/2018 01/15/18   Shon Hale, MD  amLODipine (NORVASC) 5 MG tablet Take 1 tablet (5 mg total) by mouth daily. 01/15/18   Shon Hale, MD  betaxolol (BETOPTIC-S) 0.5 % ophthalmic suspension Place 1 drop into both eyes 2 (two) times daily. 01/23/18   [provider]  citalopram (CELEXA) 10 MG tablet Take 1 tablet (10 mg total) by mouth daily for 2 days. 05/30/18 06/01/18  Rhetta Mura, MD  dipyridamole-aspirin  (AGGRENOX) 200-25 MG 12hr capsule TAKE 1 CAPSULE BY MOUTH 2 (TWO) TIMES DAILY. Patient taking differently: Take 1 capsule by mouth 2 (two) times daily.  01/15/18   Shon Hale, MD  donepezil (ARICEPT) 10 MG tablet Take 1 tablet (10 mg total) by mouth at bedtime. 01/23/18   [provider]  gabapentin (NEURONTIN) 100 MG capsule TAKE 1 CAPSULE (100 MG TOTAL) BY MOUTH AT BEDTIME. Patient taking differently: Take 100 mg by mouth at bedtime.  01/15/18   Shon Hale, MD  guaiFENesin (MUCINEX) 600 MG 12 hr tablet Take 600 mg by mouth every 12 (twelve) hours.    [provider]  ipratropium (ATROVENT) 0.02 % nebulizer solution Take 2.5 mLs (0.5 mg total) by nebulization every 6 (six) hours as needed for wheezing or shortness of breath. 05/30/18   Rhetta Mura, MD  latanoprost (XALATAN) 0.005 % ophthalmic solution Place 1 drop into both eyes at bedtime. 01/23/18   [provider]  levothyroxine (SYNTHROID, LEVOTHROID) 75 MCG tablet Take 0.5 tablets (37.5 mcg total) by mouth daily. 01/15/18   Shon Hale, MD  OLANZapine (ZYPREXA) 2.5 MG tablet Take 1 tablet (2.5 mg total) by mouth every evening. 05/30/18   Rhetta Mura, MD  potassium chloride SA (K-DUR,KLOR-CON) 20 MEQ tablet Take 2 tablets (40 mEq total) by mouth 2 (two) times daily. 05/30/18   Rhetta Mura, MD    Physical Exam: Vitals:   06/08/18 2145 06/08/18 2200 06/08/18 2215 06/08/18 2230  BP: (!) 96/41 (!) 102/49 (!) 101/57 (!) 101/50  Pulse: 70 74 74 71  Resp: 19 (!) 22 (!) 25 20  Temp:      TempSrc:      SpO2: 97% 97% 98% 96%    Physical Exam  Constitutional: No distress.  Resting comfortably in a hospital stretcher.  HENT:  Head: Normocephalic.  Mouth/Throat: Oropharynx is clear and moist.  Eyes: Right eye exhibits no discharge. Left eye exhibits no discharge.  Neck: Neck supple.  Cardiovascular: Normal rate, regular rhythm and intact distal pulses.  Extremities warm to touch and  well perfused.  Pulmonary/Chest: Effort normal and breath sounds normal. No respiratory distress. She has no wheezes. She has no rales.  Abdominal: Soft. Bowel sounds are normal. She exhibits no distension. There is no abdominal tenderness. There is no guarding.  Musculoskeletal:        General: No edema.  Neurological:  Awake and alert Oriented to person and place only.  Skin: Skin is warm and dry. She is not diaphoretic.     Labs on Admission: I have personally reviewed following labs and imaging studies  CBC: Recent Labs  Lab 06/08/18 2010 06/08/18 2031  WBC 7.7  --   HGB 10.4* 10.9*  HCT 33.7* 32.0*  MCV  103.1*  --   PLT 496*  --    Basic Metabolic Panel: Recent Labs  Lab 06/08/18 2010 06/08/18 2031  NA 136 137  K 4.6 4.6  CL 107 107  CO2 22  --   GLUCOSE 160* 160*  BUN 20 20  CREATININE 0.96 0.90  CALCIUM 8.6*  --    GFR: CrCl cannot be calculated (Unknown ideal weight.). Liver Function Tests: Recent Labs  Lab 06/08/18 2010  AST 18  ALT 14  ALKPHOS 52  BILITOT 0.4  PROT 5.7*  ALBUMIN 2.4*   No results for input(s): LIPASE, AMYLASE in the last 168 hours. No results for input(s): AMMONIA in the last 168 hours. Coagulation Profile: No results for input(s): INR, PROTIME in the last 168 hours. Cardiac Enzymes: No results for input(s): CKTOTAL, CKMB, CKMBINDEX, TROPONINI in the last 168 hours. BNP (last 3 results) No results for input(s): PROBNP in the last 8760 hours. HbA1C: No results for input(s): HGBA1C in the last 72 hours. CBG: No results for input(s): GLUCAP in the last 168 hours. Lipid Profile: No results for input(s): CHOL, HDL, LDLCALC, TRIG, CHOLHDL, LDLDIRECT in the last 72 hours. Thyroid Function Tests: No results for input(s): TSH, T4TOTAL, FREET4, T3FREE, THYROIDAB in the last 72 hours. Anemia Panel: No results for input(s): VITAMINB12, FOLATE, FERRITIN, TIBC, IRON, RETICCTPCT in the last 72 hours. Urine analysis:    Component  Value Date/Time   COLORURINE YELLOW 05/24/2018 1702   APPEARANCEUR CLOUDY (A) 05/24/2018 1702   LABSPEC 1.016 05/24/2018 1702   PHURINE 7.0 05/24/2018 1702   GLUCOSEU NEGATIVE 05/24/2018 1702   HGBUR NEGATIVE 05/24/2018 1702   BILIRUBINUR NEGATIVE 05/24/2018 1702   BILIRUBINUR neg 05/31/2012 1112   KETONESUR 5 (A) 05/24/2018 1702   PROTEINUR NEGATIVE 05/24/2018 1702   UROBILINOGEN 1.0 03/08/2015 1526   NITRITE NEGATIVE 05/24/2018 1702   LEUKOCYTESUR NEGATIVE 05/24/2018 1702    Radiological Exams on Admission: No results found.  EKG: Independently reviewed.  Sinus rhythm (heart rate 71).  Assessment/Plan Principal Problem:   GI bleed Active Problems:   Hypertension   Stroke [facility](HCC)   Thyroid disease   CKD (chronic kidney disease) stage 2, GFR 60-89 ml/min   GI bleed Noted to have melena and hematochezia at her nursing home today.  Initial hemoglobin 10.4 and repeat 10.9.  Recent baseline hemoglobin 11.0-11.2.  No further episodes of hematochezia or melena in the ED.  She was hypotensive with systolic in the 70s per EMS.  Continues to be hypotensive with systolic in the 90s and diastolic in the 40s after receiving 1 L normal saline bolus in the ED. Per ED provider, rectal exam showing no signs of overt bleeding, noted to have specks of bright red blood in the stool.  FOBT positive.  ED provider discussed the case with Dr. Bosie ClosSchooler from GI and was told they will consult on the patient.  No recent colonoscopy or EGD results in the chart. -Patient has received IV Protonix bolus, continue infusion -Continue to monitor CBC -Type and screen -2 large-bore IVs -Continue IV fluid resuscitation with bolus and maintenance fluids -Monitor in the progressive care unit -GI following; recommendations pending -Keep n.p.o.  CKD II-III -Creatinine 0.9 and BUN 20.  Appears mildly dehydrated at BUN and creatinine up from baseline. -Continue IV fluid -Avoid nephrotoxic agents/contrast -Repeat BMP  in a.m.  Hypothyroidism -Continue home Synthroid in IV form  Dementia -Continue home Zyprexa at bedtime in IM form  History of CVA -Hold Aggrenox in the setting  of GI bleed  History of hypertension -Hold home antihypertensives in the setting of hypotension  DVT prophylaxis: SCDs Code Status: DNR.  Paperwork from nursing home present at bedside. Family Communication: No family available. Disposition Plan: Anticipate discharge in 1 to 2 days. Consults called: GI Admission status: Observation   John Giovanni MD [facility] Pager [phone]  If 7PM-7AM, please contact night-coverage [email or web address] Password [facility]  06/08/2018, 10:48 PM

## 2018-06-09 ENCOUNTER — Other Ambulatory Visit: Payer: Self-pay

## 2018-06-09 DIAGNOSIS — K922 Gastrointestinal hemorrhage, unspecified: Secondary | ICD-10-CM

## 2018-06-09 DIAGNOSIS — I10 Essential (primary) hypertension: Secondary | ICD-10-CM

## 2018-06-09 DIAGNOSIS — E079 Disorder of thyroid, unspecified: Secondary | ICD-10-CM

## 2018-06-09 DIAGNOSIS — D62 Acute posthemorrhagic anemia: Secondary | ICD-10-CM | POA: Diagnosis not present

## 2018-06-09 LAB — CBC
HCT: 27.6 % — ABNORMAL LOW (ref 36.0–46.0)
HCT: 29 % — ABNORMAL LOW (ref 36.0–46.0)
Hemoglobin: 8.7 g/dL — ABNORMAL LOW (ref 12.0–15.0)
Hemoglobin: 9.2 g/dL — ABNORMAL LOW (ref 12.0–15.0)
MCH: 31.8 pg (ref 26.0–34.0)
MCH: 32.6 pg (ref 26.0–34.0)
MCHC: 31.5 g/dL (ref 30.0–36.0)
MCHC: 31.7 g/dL (ref 30.0–36.0)
MCV: 100.7 fL — ABNORMAL HIGH (ref 80.0–100.0)
MCV: 102.8 fL — ABNORMAL HIGH (ref 80.0–100.0)
Platelets: 419 10*3/uL — ABNORMAL HIGH (ref 150–400)
Platelets: 442 10*3/uL — ABNORMAL HIGH (ref 150–400)
RBC: 2.74 MIL/uL — ABNORMAL LOW (ref 3.87–5.11)
RBC: 2.82 MIL/uL — ABNORMAL LOW (ref 3.87–5.11)
RDW: 14.7 % (ref 11.5–15.5)
RDW: 14.7 % (ref 11.5–15.5)
WBC: 6.3 10*3/uL (ref 4.0–10.5)
WBC: 4.6 10*3/uL (ref 4.0–10.5)
nRBC: 0 % (ref 0.0–0.2)
nRBC: 0 % (ref 0.0–0.2)

## 2018-06-09 LAB — BASIC METABOLIC PANEL
Anion gap: 4 — ABNORMAL LOW (ref 5–15)
BUN: 22 mg/dL (ref 8–23)
CO2: 20 mmol/L — ABNORMAL LOW (ref 22–32)
Calcium: 7.6 mg/dL — ABNORMAL LOW (ref 8.9–10.3)
Chloride: 112 mmol/L — ABNORMAL HIGH (ref 98–111)
Creatinine, Ser: 0.82 mg/dL (ref 0.44–1.00)
GFR calc Af Amer: >60 mL/min (ref >60)
GFR calc non Af Amer: >60 mL/min (ref >60)
Glucose, Bld: 93 mg/dL (ref 70–99)
Potassium: 4 mmol/L (ref 3.5–5.1)
Sodium: 136 mmol/L (ref 135–145)

## 2018-06-09 LAB — MRSA PCR SCREENING: MRSA by PCR: NEGATIVE

## 2018-06-09 NOTE — Progress Notes (Signed)
PROGRESS NOTE        PATIENT DETAILS Name: Darlene Wilson Age: 85 y.o. Sex: female Date of Birth: 1927-04-22 Admit Date: 06/08/2018 Admitting Physician John Giovanni, MD ZOX:[id number], Bryon Lions, MD  Brief Narrative: Patient is a 85 y.o. female with history of dementia, CVA on Aggrenox, hypothyroidism, hypertension-presented from her memory care unit for melanotic stools, found to have acute blood loss anemia.  Admitted to the hospitalist service.  Subjective: Pleasantly confused-denies any chest pain or abdominal pain.  Assessment/Plan: Upper GI bleeding with acute blood loss anemia: Continue PPI-follow CBC-if hemoglobin drops significantly we will plan to transfuse.  Type and screen.  Await further input from GI.  History of CVA: Pleasantly confused-but nonfocal exam-Aggrenox on hold due to active GI bleeding.  Hypertension: Blood pressure relatively stable-was hypotensive on presentation-continue to hold all antihypertensives  Hypothyroidism: Continue Synthroid  Dementia with mild delirium: Pleasantly confused-continue Zyprexa  DVT Prophylaxis: SCD's  Code Status: DNR  Family Communication: None at bedside  Disposition Plan: Remain inpatient-needs inpatient stay-not stable for discharge-hopefully back to SNF/ALF on discharge  Antimicrobial agents: Anti-infectives (From admission, onward)   None      Procedures: None  CONSULTS:  GI  Time spent: 25- minutes-Greater than 50% of this time was spent in counseling, explanation of diagnosis, planning of further management, and coordination of care.  MEDICATIONS: Scheduled Meds: . betaxolol  1 drop Both Eyes BID  . latanoprost  1 drop Both Eyes QHS  . levothyroxine  18 mcg Intravenous QAC breakfast  . OLANZapine  2.5 mg Intramuscular QHS   Continuous Infusions: . sodium chloride 125 mL/hr at 06/09/18 0208  . pantoprozole (PROTONIX) infusion 8 mg/hr (06/09/18 0808)   PRN  Meds:.ipratropium-albuterol   PHYSICAL EXAM: Vital signs: Vitals:   06/08/18 2215 06/08/18 2230 06/08/18 2315 06/09/18 0431  BP: (!) 101/57 (!) 101/50 (!) 113/55 (!) 102/59  Pulse: 74 71 81 81  Resp: (!) 25 20 20 16   Temp:   (!) 97.5 F (36.4 C) 98 F (36.7 C)  TempSrc:   Oral Oral  SpO2: 98% 96% 100% 100%   There were no vitals filed for this visit. There is no height or weight on file to calculate BMI.   General appearance :Awake, alert, not in any distress.  HEENT: Atraumatic and Normocephalic Neck: supple Resp:Good air entry bilaterally, no added sounds  CVS: S1 S2 regular GI: Bowel sounds present, Non tender and not distended with no gaurding, rigidity or rebound.No organomegaly Extremities: B/L Lower Ext shows no edema, both legs are warm to touch Neurology: Nonfocal Musculoskeletal:No digital cyanosis Skin:No Rash, warm and dry Wounds:N/A  I have personally reviewed following labs and imaging studies  LABORATORY DATA: CBC: Recent Labs  Lab 06/08/18 2010 06/08/18 2031 06/09/18 0330  WBC 7.7  --  6.3  HGB 10.4* 10.9* 8.7*  HCT 33.7* 32.0* 27.6*  MCV 103.1*  --  100.7*  PLT 496*  --  419*    Basic Metabolic Panel: Recent Labs  Lab 06/08/18 2010 06/08/18 2031 06/09/18 0330  NA 136 137 136  K 4.6 4.6 4.0  CL 107 107 112*  CO2 22  --  20*  GLUCOSE 160* 160* 93  BUN 20 20 22   CREATININE 0.96 0.90 0.82  CALCIUM 8.6*  --  7.6*    GFR: CrCl cannot be calculated (Unknown ideal weight.).  Liver Function Tests: Recent Labs  Lab 06/08/18 2010  AST 18  ALT 14  ALKPHOS 52  BILITOT 0.4  PROT 5.7*  ALBUMIN 2.4*   No results for input(s): LIPASE, AMYLASE in the last 168 hours. No results for input(s): AMMONIA in the last 168 hours.  Coagulation Profile: No results for input(s): INR, PROTIME in the last 168 hours.  Cardiac Enzymes: No results for input(s): CKTOTAL, CKMB, CKMBINDEX, TROPONINI in the last 168 hours.  BNP (last 3 results) No  results for input(s): PROBNP in the last 8760 hours.  HbA1C: No results for input(s): HGBA1C in the last 72 hours.  CBG: No results for input(s): GLUCAP in the last 168 hours.  Lipid Profile: No results for input(s): CHOL, HDL, LDLCALC, TRIG, CHOLHDL, LDLDIRECT in the last 72 hours.  Thyroid Function Tests: No results for input(s): TSH, T4TOTAL, FREET4, T3FREE, THYROIDAB in the last 72 hours.  Anemia Panel: No results for input(s): VITAMINB12, FOLATE, FERRITIN, TIBC, IRON, RETICCTPCT in the last 72 hours.  Urine analysis:    Component Value Date/Time   COLORURINE YELLOW 05/24/2018 1702   APPEARANCEUR CLOUDY (A) 05/24/2018 1702   LABSPEC 1.016 05/24/2018 1702   PHURINE 7.0 05/24/2018 1702   GLUCOSEU NEGATIVE 05/24/2018 1702   HGBUR NEGATIVE 05/24/2018 1702   BILIRUBINUR NEGATIVE 05/24/2018 1702   BILIRUBINUR neg 05/31/2012 1112   KETONESUR 5 (A) 05/24/2018 1702   PROTEINUR NEGATIVE 05/24/2018 1702   UROBILINOGEN 1.0 03/08/2015 1526   NITRITE NEGATIVE 05/24/2018 1702   LEUKOCYTESUR NEGATIVE 05/24/2018 1702    Sepsis Labs: Lactic Acid, Venous    Component Value Date/Time   LATICACIDVEN 1.14 05/24/2018 1542    MICROBIOLOGY: Recent Results (from the past 240 hour(s))  MRSA PCR Screening     Status: None   Collection Time: 06/09/18 12:49 AM  Result Value Ref Range Status   MRSA by PCR NEGATIVE NEGATIVE Final    Comment:        The GeneXpert MRSA Assay (FDA approved for NASAL specimens only), is one component of a comprehensive MRSA colonization surveillance program. It is not intended to diagnose MRSA infection nor to guide or monitor treatment for MRSA infections. Performed at [facility], [address] [place], [place] [address]     RADIOLOGY STUDIES/RESULTS: Dg Chest 2 View  Result Date: 05/24/2018 CLINICAL DATA:  Dyspnea with increasing lethargy and fever for a few days. EXAM: CHEST - 2 VIEW COMPARISON:  None. FINDINGS: Normal heart size and  mediastinal contours with aortic atherosclerosis. Slight uncoiling of the thoracic aorta is identified. Patchy pulmonary consolidation in the right upper lobe distribution is noted consistent with pneumonia. Left lung appears clear. No acute osseous abnormality is noted. IMPRESSION: Right upper lobe pneumonia. Followup PA and lateral chest X-ray is recommended in 3-4 weeks following trial of antibiotic therapy to ensure resolution and exclude underlying malignancy. Electronically Signed   By: Tollie Ethavid  Kwon M.D.   On: 05/24/2018 14:53   Ct Angio Chest Pe W Or Wo Contrast  Result Date: 05/24/2018 CLINICAL DATA:  Shortness of breath and lethargy EXAM: CT ANGIOGRAPHY CHEST WITH CONTRAST TECHNIQUE: Multidetector CT imaging of the chest was performed using the standard protocol during bolus administration of intravenous contrast. Multiplanar CT image reconstructions and MIPs were obtained to evaluate the vascular anatomy. Examination is somewhat limited by patient motion artifact. CONTRAST:  100mL ISOVUE-370 IOPAMIDOL (ISOVUE-370) INJECTION 76% COMPARISON:  None. FINDINGS: Cardiovascular: Thoracic aorta demonstrates diffuse atherosclerotic calcifications without aneurysmal dilatation or dissection. No cardiac  enlargement is seen. Mild coronary calcifications are noted. The pulmonary artery is well visualized centrally without evidence pulmonary emboli. The peripheral branches are somewhat bolus as well as patient motion artifact during the exam. Mediastinum/Nodes: The esophagus is within normal limits. No hilar or mediastinal adenopathy is seen. The thoracic inlet is unremarkable. Lungs/Pleura: Left lung is well aerated without focal infiltrate or sizable effusion. The right lung demonstrates multifocal infiltrate consistent with acute pneumonia particularly within the right upper lobe but to a lesser degree in the right lower lobe. Small right effusion is seen. Upper Abdomen: Visualized upper abdomen is within normal  limits. Musculoskeletal: Degenerative changes of the thoracic spine noted. Review of the MIP images confirms the above findings. IMPRESSION: No definitive pulmonary emboli are seen although peripheral branches are somewhat limited due to patient motion artifact. Patchy multifocal infiltrates involving the right upper and right lower lobes. Small right pleural effusion. Aortic Atherosclerosis (ICD10-I70.0). Electronically Signed   By: Alcide Clever M.D.   On: 05/24/2018 21:47   Dg Swallowing Func-speech Pathology  Result Date: 05/28/2018 Objective Swallowing Evaluation: Type of Study: MBS-Modified Barium Swallow Study  Patient Details Name: TAKINA NOVEL MRN: [id number] Date of Birth: 10-25-1926 Today's Date: 05/28/2018 Time: SLP Start Time (ACUTE ONLY): 1235 -SLP Stop Time (ACUTE ONLY): 1255 SLP Time Calculation (min) (ACUTE ONLY): 20 min Past Medical History: Past Medical History: Diagnosis Date . Aortic atherosclerosis (HCC) 05/26/2018 . Chronic kidney disease 1998  stones . Depression with anxiety 09/14/2014 . Dyspnea  . Fibrocystic breast 01/12/2011 . Glaucoma   both eyes . History of chicken pox  . Hypertension  . Insomnia 01/13/2011 . Kidney stone   stones  . Memory loss 10/12/2012 . Nausea 05/31/2012 . Spinal stenosis 06/14/2011 . Stroke (HCC) 04/2007  x 3 total . Thyroid disease   hypothyroid . Urinary frequency 02/09/2011 . Vertigo 02/09/2011 Past Surgical History: Past Surgical History: Procedure Laterality Date . ABDOMINAL HYSTERECTOMY  1963 . BACK SURGERY  1989  discectomy L4-5 1989, good results . BREAST SURGERY  1970  biopsy, benign, b/l HPI: 86 y.o. female with Past medical history of CVA, dementia, hypothyroidism, depression, HTN. Admitted with pna  Subjective: alert Assessment / Plan / Recommendation CHL IP CLINICAL IMPRESSIONS 05/28/2018 Clinical Impression Pt presents with functional oropharyngeal swallow marked by adequate mastication, reliable laryngeal vestibule closure with no  penetration/aspiration; no residue post-swallow with normal pharyngeal squeeze.  Material passed through UES with no resistance.  No indication of a dysphagia-related aspiration pna.  Continue regular diet, thin liquids; no SLP f/u warranted.  SLP Visit Diagnosis Dysphagia, unspecified (R13.10) Attention and concentration deficit following -- Frontal lobe and executive function deficit following -- Impact on safety and function No limitations   CHL IP TREATMENT RECOMMENDATION 05/28/2018 Treatment Recommendations No treatment recommended at this time   Prognosis 05/25/2018 Prognosis for Safe Diet Advancement Good Barriers to Reach Goals Cognitive deficits Barriers/Prognosis Comment -- CHL IP DIET RECOMMENDATION 05/28/2018 SLP Diet Recommendations Regular solids;Thin liquid Liquid Administration via Straw;Cup Medication Administration Whole meds with liquid Compensations -- Postural Changes --   CHL IP OTHER RECOMMENDATIONS 05/28/2018 Recommended Consults -- Oral Care Recommendations Oral care BID Other Recommendations --   CHL IP FOLLOW UP RECOMMENDATIONS 05/28/2018 Follow up Recommendations None   CHL IP FREQUENCY AND DURATION 05/25/2018 Speech Therapy Frequency (ACUTE ONLY) min 1 x/week Treatment Duration 1 week      CHL IP ORAL PHASE 05/28/2018 Oral Phase WFL Oral - Pudding Teaspoon -- Oral - Pudding Cup -- Oral -  Honey Teaspoon -- Oral - Honey Cup -- Oral - Nectar Teaspoon -- Oral - Nectar Cup -- Oral - Nectar Straw -- Oral - Thin Teaspoon -- Oral - Thin Cup -- Oral - Thin Straw -- Oral - Puree -- Oral - Mech Soft -- Oral - Regular -- Oral - Multi-Consistency -- Oral - Pill -- Oral Phase - Comment --  CHL IP PHARYNGEAL PHASE 05/28/2018 Pharyngeal Phase WFL Pharyngeal- Pudding Teaspoon -- Pharyngeal -- Pharyngeal- Pudding Cup -- Pharyngeal -- Pharyngeal- Honey Teaspoon -- Pharyngeal -- Pharyngeal- Honey Cup -- Pharyngeal -- Pharyngeal- Nectar Teaspoon -- Pharyngeal -- Pharyngeal- Nectar Cup -- Pharyngeal --  Pharyngeal- Nectar Straw -- Pharyngeal -- Pharyngeal- Thin Teaspoon -- Pharyngeal -- Pharyngeal- Thin Cup -- Pharyngeal -- Pharyngeal- Thin Straw -- Pharyngeal -- Pharyngeal- Puree -- Pharyngeal -- Pharyngeal- Mechanical Soft -- Pharyngeal -- Pharyngeal- Regular -- Pharyngeal -- Pharyngeal- Multi-consistency -- Pharyngeal -- Pharyngeal- Pill -- Pharyngeal -- Pharyngeal Comment --  CHL IP CERVICAL ESOPHAGEAL PHASE 05/28/2018 Cervical Esophageal Phase WFL Pudding Teaspoon -- Pudding Cup -- Honey Teaspoon -- Honey Cup -- Nectar Teaspoon -- Nectar Cup -- Nectar Straw -- Thin Teaspoon -- Thin Cup -- Thin Straw -- Puree -- Mechanical Soft -- Regular -- Multi-consistency -- Pill -- Cervical Esophageal Comment -- Blenda Mounts Laurice 05/28/2018, 12:57 PM                LOS: 0 days   Jeoffrey Massed, MD  [facility]  If 7PM-7AM, please contact night-coverage  Please page via [email or web address] [email or web address]-click on MD name and type text message  06/09/2018, 10:37 AM

## 2018-06-09 NOTE — Consult Note (Addendum)
Referring Provider: Dr. Loney Loh Primary Care Physician:  [name], MD Primary Gastroenterologist:  UNASSIGNED  Reason for Consultation:  GI bleed  HPI: Darlene Wilson is a 85 y.o. female sent from a nursing home with a report of melena and hematochezia yesterday and found to be hypotensive in the 70's per EMS. BP increased to the 90's systolic with IVFs. In ER she had specks of bright red blood in the stool and was heme positive. Hgb 8.7 (10.4). Patient is demented and cannot give any history regarding her bleeding and did not know she was having bleeding. She does not know the year but does know her name and DOB. Denies abdominal pain. Unknown whether she has had a colonoscopy/EGD in the past. Patient is on Aggrenox.  Past Medical History:  Diagnosis Date  . Aortic atherosclerosis (HCC) 05/26/2018  . Chronic kidney disease 1998   stones  . Depression with anxiety 09/14/2014  . Dyspnea   . Fibrocystic breast 01/12/2011  . Glaucoma    both eyes  . History of chicken pox   . Hypertension   . Insomnia 01/13/2011  . Kidney stone    stones   . Memory loss 10/12/2012  . Nausea 05/31/2012  . Spinal stenosis 06/14/2011  . Stroke (HCC) 04/2007   x 3 total  . Thyroid disease    hypothyroid  . Urinary frequency 02/09/2011  . Vertigo 02/09/2011    Past Surgical History:  Procedure Laterality Date  . ABDOMINAL HYSTERECTOMY  1963  . BACK SURGERY  1989   discectomy L4-5 1989, good results  . BREAST SURGERY  1970   biopsy, benign, b/l    Prior to Admission medications   Medication Sig Start Date End Date Taking? Authorizing Provider  acetaminophen (TYLENOL) 325 MG tablet Take 2 tablets (650 mg total) by mouth every 6 (six) hours as needed. Patient taking differently: Take 650 mg by mouth every 6 (six) hours as needed (for pain).  11/21/12  Yes Christiane Ha, MD  albuterol (PROVENTIL HFA;VENTOLIN HFA) 108 (90 Base) MCG/ACT inhaler Inhale 2 puffs into the lungs every 4 (four) hours  as needed for wheezing or shortness of breath. 01/15/18  Yes Emokpae, Courage, MD  amLODipine (NORVASC) 5 MG tablet Take 1 tablet (5 mg total) by mouth daily. 01/15/18  Yes Emokpae, Courage, MD  betaxolol (BETOPTIC-S) 0.5 % ophthalmic suspension Place 1 drop into both eyes 2 (two) times daily. 01/23/18  Yes [provider]  citalopram (CELEXA) 10 MG tablet Take 10 mg by mouth daily.   Yes [provider]  dipyridamole-aspirin (AGGRENOX) 200-25 MG 12hr capsule TAKE 1 CAPSULE BY MOUTH 2 (TWO) TIMES DAILY. Patient taking differently: Take 1 capsule by mouth 2 (two) times daily.  01/15/18  Yes Emokpae, Courage, MD  donepezil (ARICEPT) 10 MG tablet Take 1 tablet (10 mg total) by mouth at bedtime. 01/23/18  Yes [provider]  gabapentin (NEURONTIN) 100 MG capsule TAKE 1 CAPSULE (100 MG TOTAL) BY MOUTH AT BEDTIME. Patient taking differently: Take 100 mg by mouth at bedtime.  01/15/18  Yes Emokpae, Courage, MD  ipratropium (ATROVENT) 0.02 % nebulizer solution Take 2.5 mLs (0.5 mg total) by nebulization every 6 (six) hours as needed for wheezing or shortness of breath. 05/30/18  Yes Rhetta Mura, MD  latanoprost (XALATAN) 0.005 % ophthalmic solution Place 1 drop into both eyes at bedtime. 01/23/18  Yes [provider]  levothyroxine (SYNTHROID, LEVOTHROID) 75 MCG tablet Take 0.5 tablets (37.5 mcg total) by mouth  daily. Patient taking differently: Take 37.5 mcg by mouth daily before breakfast.  01/15/18  Yes Emokpae, Courage, MD  OLANZapine (ZYPREXA) 2.5 MG tablet Take 1 tablet (2.5 mg total) by mouth every evening. 05/30/18  Yes Rhetta MuraSamtani, Jai-Gurmukh, MD  potassium chloride SA (K-DUR,KLOR-CON) 20 MEQ tablet Take 2 tablets (40 mEq total) by mouth 2 (two) times daily. 05/30/18  Yes Rhetta MuraSamtani, Jai-Gurmukh, MD  guaiFENesin (MUCINEX) 600 MG 12 hr tablet Take 600 mg by mouth every 12 (twelve) hours.    [provider]    Scheduled Meds: . betaxolol  1 drop Both Eyes BID  .  latanoprost  1 drop Both Eyes QHS  . levothyroxine  18 mcg Intravenous QAC breakfast  . OLANZapine  2.5 mg Intramuscular QHS   Continuous Infusions: . sodium chloride 75 mL/hr at 06/09/18 1051  . pantoprozole (PROTONIX) infusion 8 mg/hr (06/09/18 0808)   PRN Meds:.ipratropium-albuterol  Allergies as of 06/08/2018 - Review Complete 06/08/2018  Allergen Reaction Noted  . Ciprofloxacin Hives 01/12/2011  . Combigan [brimonidine tartrate-timolol] Swelling 01/12/2011  . Detrol [tolterodine tartrate] Hives 01/12/2011  . Oxycodone Hives 01/12/2011  . Penicillins Hives, Itching, and Rash 01/12/2011  . Sulfa antibiotics Hives, Itching, and Rash 01/12/2011    Family History  Problem Relation Age of Onset  . Heart disease Mother   . Hypertension Mother   . Diabetes Mother   . Heart disease Father   . Hypertension Father   . Cancer Sister        breast cancer  . Mental illness Brother        service connected    Social History   Socioeconomic History  . Marital status: Widowed    Spouse name: Not on file  . Number of children: 2  . Years of education: HS  . Highest education level: Not on file  Occupational History  . Occupation: Retired  Engineer, productionocial Needs  . Financial resource strain: Not on file  . Food insecurity:    Worry: Not on file    Inability: Not on file  . Transportation needs:    Medical: Not on file    Non-medical: Not on file  Tobacco Use  . Smoking status: Never Smoker  . Smokeless tobacco: Never Used  Substance and Sexual Activity  . Alcohol use: No  . Drug use: No  . Sexual activity: Not Currently    Comment: lives alone, no dietary restrictions  Lifestyle  . Physical activity:    Days per week: Not on file    Minutes per session: Not on file  . Stress: Not on file  Relationships  . Social connections:    Talks on phone: Not on file    Gets together: Not on file    Attends religious service: Not on file    Active member of club or organization: Not  on file    Attends meetings of clubs or organizations: Not on file    Relationship status: Not on file  . Intimate partner violence:    Fear of current or ex partner: Not on file    Emotionally abused: Not on file    Physically abused: Not on file    Forced sexual activity: Not on file  Other Topics Concern  . Not on file  Social History Narrative   Patient lives at home alone.   Caffeine Use: 1-2 cups  daily    Review of Systems: Unable to obtain from patient due to dementia  Physical Exam: Vital signs: Vitals:  06/09/18 0431 06/09/18 1055  BP: (!) 102/59 (!) 102/59  Pulse: 81 81  Resp: 16 16  Temp: 98 F (36.7 C) 98 F (36.7 C)  SpO2: 100%    Last BM Date: (UTA) General:   Lethargic, elderly, demented, thin, no acute distress Head: normocephalic, atraumatic Eyes: anicteric sclera ENT: oropharynx clear Neck: supple, nontender Lungs:  Clear throughout to auscultation.   No wheezes, crackles, or rhonchi. No acute distress. Heart:  Regular rate and rhythm; no murmurs, clicks, rubs,  or gallops. Abdomen: periumbilical tenderness with minimal guarding, soft, nondistended, +BS  Rectal:  Deferred Ext: no edema  GI:  Lab Results: Recent Labs    06/08/18 2010 06/08/18 2031 06/09/18 0330  WBC 7.7  --  6.3  HGB 10.4* 10.9* 8.7*  HCT 33.7* 32.0* 27.6*  PLT 496*  --  419*   BMET Recent Labs    06/08/18 2010 06/08/18 2031 06/09/18 0330  NA 136 137 136  K 4.6 4.6 4.0  CL 107 107 112*  CO2 22  --  20*  GLUCOSE 160* 160* 93  BUN 20 20 22   CREATININE 0.96 0.90 0.82  CALCIUM 8.6*  --  7.6*   LFT Recent Labs    06/08/18 2010  PROT 5.7*  ALBUMIN 2.4*  AST 18  ALT 14  ALKPHOS 52  BILITOT 0.4   PT/INR No results for input(s): LABPROT, INR in the last 72 hours.   Studies/Results: No results found.  Impression/Plan: GI bleed suspect a lower GI source such as diverticular source. Due to her advanced age, dementia, and multiple comorbidities would manage  her conservatively and not pursue any invasive GI procedures. Follow H/Hs. Clear liquid diet ok. Will f/u.    LOS: 0 days   Shirley Friar  06/09/2018, 12:36 PM  Questions please call [phone]

## 2018-06-09 NOTE — Evaluation (Signed)
Physical Therapy Evaluation Patient Details Name: Darlene SchmidtJanie B Wilson MRN: [id number] DOB: 09-16-1926 Today's Date: 06/09/2018   History of Present Illness  Pt is an 85 y/o female admitted from a facility (memory care) secondary to hematochezia and found to have a GIB and hypotension. PMH including but not limited to dementia, CKD, spinal stenosis and vertigo.    Clinical Impression  Pt presented supine in bed with HOB elevated, awake and willing to participate in therapy session. Unsure of pt's PLOF as she is an unreliable historian secondary to baseline cognitive deficits with no family/caregivers present to provide any information. Per chart review, pt is from a memory care facility. Pt currently requires min A for bed mobility and mod A x2 for transfers with use of RW. Pt would continue to benefit from skilled physical therapy services at this time while admitted and after d/c to address the below listed limitations in order to improve overall safety and independence with functional mobility.     Follow Up Recommendations SNF    Equipment Recommendations  None recommended by PT    Recommendations for Other Services       Precautions / Restrictions Precautions Precautions: Fall Restrictions Weight Bearing Restrictions: No      Mobility  Bed Mobility Overal bed mobility: Needs Assistance Bed Mobility: Supine to Sit     Supine to sit: Min assist     General bed mobility comments: increased time and effort, multimodal cueing for sequencing, assist for stability and safety  Transfers Overall transfer level: Needs assistance Equipment used: Rolling walker (2 wheeled) Transfers: Sit to/from UGI CorporationStand;Stand Pivot Transfers Sit to Stand: Mod assist;+2 physical assistance Stand pivot transfers: Mod assist;+2 physical assistance       General transfer comment: increased time and effort, cueing for safe hand placement, multimodal cueing for sequencing, assist to power into standing  and for pivotal movements to chair towards her L side  Ambulation/Gait                Stairs            Wheelchair Mobility    Modified Rankin (Stroke Patients Only)       Balance Overall balance assessment: Needs assistance Sitting-balance support: Feet supported Sitting balance-Leahy Scale: Fair     Standing balance support: Bilateral upper extremity supported Standing balance-Leahy Scale: Poor                               Pertinent Vitals/Pain Pain Assessment: No/denies pain    Home Living Family/patient expects to be discharged to:: Skilled nursing facility                      Prior Function           Comments: unsure, pt not a reliable historian with no family/caregivers present to provide any information     Hand Dominance        Extremity/Trunk Assessment   Upper Extremity Assessment Upper Extremity Assessment: Generalized weakness    Lower Extremity Assessment Lower Extremity Assessment: Generalized weakness       Communication   Communication: No difficulties  Cognition Arousal/Alertness: Awake/alert Behavior During Therapy: Flat affect Overall Cognitive Status: No family/caregiver present to determine baseline cognitive functioning Area of Impairment: Memory;Following commands;Safety/judgement;Problem solving                     Memory: Decreased short-term memory Following Commands:  Follows one step commands with increased time;Follows one step commands inconsistently Safety/Judgement: Decreased awareness of deficits;Decreased awareness of safety   Problem Solving: Slow processing;Decreased initiation;Difficulty sequencing;Requires verbal cues;Requires tactile cues        General Comments      Exercises     Assessment/Plan    PT Assessment Patient needs continued PT services  PT Problem List Decreased strength;Decreased activity tolerance;Decreased balance;Decreased mobility;Decreased  coordination;Decreased cognition;Decreased knowledge of use of DME;Decreased knowledge of precautions;Decreased safety awareness       PT Treatment Interventions DME instruction;Stair training;Gait training;Functional mobility training;Therapeutic activities;Therapeutic exercise;Balance training;Neuromuscular re-education;Cognitive remediation;Patient/family education    PT Goals (Current goals can be found in the Care Plan section)  Acute Rehab PT Goals Patient Stated Goal: unable to state PT Goal Formulation: Patient unable to participate in goal setting Time For Goal Achievement: 06/23/18 Potential to Achieve Goals: Fair    Frequency Min 2X/week   Barriers to discharge        Co-evaluation               AM-PAC PT "6 Clicks" Mobility  Outcome Measure Help needed turning from your back to your side while in a flat bed without using bedrails?: A Lot Help needed moving from lying on your back to sitting on the side of a flat bed without using bedrails?: A Lot Help needed moving to and from a bed to a chair (including a wheelchair)?: A Lot Help needed standing up from a chair using your arms (e.g., wheelchair or bedside chair)?: A Lot Help needed to walk in hospital room?: A Lot Help needed climbing 3-5 steps with a railing? : Total 6 Click Score: 11    End of Session Equipment Utilized During Treatment: Gait belt Activity Tolerance: Patient limited by fatigue Patient left: in chair;with call bell/phone within reach;with chair alarm set Nurse Communication: Mobility status PT Visit Diagnosis: Other abnormalities of gait and mobility (R26.89);Muscle weakness (generalized) (M62.81)    Time: 1194-1740 PT Time Calculation (min) (ACUTE ONLY): 16 min   Charges:   PT Evaluation $PT Eval Moderate Complexity: 1 Mod          Deborah Chalk, PT, DPT  Acute Rehabilitation Services Pager [phone] Office [phone]    Darlene Wilson 06/09/2018, 3:52 PM

## 2018-06-09 NOTE — Plan of Care (Signed)
  Problem: Health Behavior/Discharge Planning: Goal: Ability to manage health-related needs will improve Outcome: Progressing   Problem: Clinical Measurements: Goal: Ability to maintain clinical measurements within normal limits will improve Outcome: Progressing Goal: Will remain free from infection Outcome: Progressing Goal: Respiratory complications will improve Outcome: Progressing   Problem: Activity: Goal: Risk for activity intolerance will decrease Outcome: Progressing   Problem: Nutrition: Goal: Adequate nutrition will be maintained Outcome: Progressing   Problem: Coping: Goal: Level of anxiety will decrease Outcome: Progressing   Problem: Elimination: Goal: Will not experience complications related to bowel motility Outcome: Progressing Goal: Will not experience complications related to urinary retention Outcome: Progressing   Problem: Pain Managment: Goal: General experience of comfort will improve Outcome: Progressing   Problem: Safety: Goal: Ability to remain free from injury will improve Outcome: Progressing   Problem: Skin Integrity: Goal: Risk for impaired skin integrity will decrease Outcome: Progressing   Problem: Education: Goal: Ability to identify signs and symptoms of gastrointestinal bleeding will improve Outcome: Progressing   Problem: Bowel/Gastric: Goal: Will show no signs and symptoms of gastrointestinal bleeding Outcome: Progressing   Problem: Fluid Volume: Goal: Will show no signs and symptoms of excessive bleeding Outcome: Progressing   Problem: Clinical Measurements: Goal: Complications related to the disease process, condition or treatment will be avoided or minimized Outcome: Progressing   

## 2018-06-10 DIAGNOSIS — D62 Acute posthemorrhagic anemia: Secondary | ICD-10-CM | POA: Diagnosis not present

## 2018-06-10 DIAGNOSIS — K922 Gastrointestinal hemorrhage, unspecified: Secondary | ICD-10-CM | POA: Diagnosis not present

## 2018-06-10 DIAGNOSIS — I10 Essential (primary) hypertension: Secondary | ICD-10-CM | POA: Diagnosis not present

## 2018-06-10 LAB — CBC
HCT: 29.4 % — ABNORMAL LOW (ref 36.0–46.0)
Hemoglobin: 9.5 g/dL — ABNORMAL LOW (ref 12.0–15.0)
MCH: 32.5 pg (ref 26.0–34.0)
MCHC: 32.3 g/dL (ref 30.0–36.0)
MCV: 100.7 fL — AB (ref 80.0–100.0)
Platelets: 422 10*3/uL — ABNORMAL HIGH (ref 150–400)
RBC: 2.92 MIL/uL — ABNORMAL LOW (ref 3.87–5.11)
RDW: 14.6 % (ref 11.5–15.5)
WBC: 4.4 10*3/uL (ref 4.0–10.5)
nRBC: 0 % (ref 0.0–0.2)

## 2018-06-10 LAB — BASIC METABOLIC PANEL
Anion gap: 6 (ref 5–15)
BUN: 7 mg/dL — ABNORMAL LOW (ref 8–23)
CO2: 24 mmol/L (ref 22–32)
CREATININE: 0.82 mg/dL (ref 0.44–1.00)
Calcium: 8.2 mg/dL — ABNORMAL LOW (ref 8.9–10.3)
Chloride: 110 mmol/L (ref 98–111)
GFR calc Af Amer: >60 mL/min (ref >60)
GFR calc non Af Amer: >60 mL/min (ref >60)
Glucose, Bld: 87 mg/dL (ref 70–99)
Potassium: 3 mmol/L — ABNORMAL LOW (ref 3.5–5.1)
Sodium: 140 mmol/L (ref 135–145)

## 2018-06-10 MED ORDER — POTASSIUM CHLORIDE CRYS ER 20 MEQ PO TBCR
40.0000 meq | EXTENDED_RELEASE_TABLET | Freq: Once | ORAL | Status: AC
  Administered 2018-06-10: 40 meq via ORAL
  Filled 2018-06-10: qty 2

## 2018-06-10 MED ORDER — LEVOTHYROXINE SODIUM 75 MCG PO TABS
37.5000 ug | ORAL_TABLET | Freq: Every day | ORAL | Status: DC
  Administered 2018-06-10 – 2018-06-14 (×5): 37.5 ug via ORAL
  Filled 2018-06-10 (×5): qty 1

## 2018-06-10 MED ORDER — DONEPEZIL HCL 10 MG PO TABS
10.0000 mg | ORAL_TABLET | Freq: Every day | ORAL | Status: DC
  Administered 2018-06-10 – 2018-06-13 (×4): 10 mg via ORAL
  Filled 2018-06-10 (×4): qty 1

## 2018-06-10 MED ORDER — GABAPENTIN 100 MG PO CAPS
100.0000 mg | ORAL_CAPSULE | Freq: Every day | ORAL | Status: DC
  Administered 2018-06-10 – 2018-06-13 (×4): 100 mg via ORAL
  Filled 2018-06-10 (×4): qty 1

## 2018-06-10 MED ORDER — PANTOPRAZOLE SODIUM 40 MG PO TBEC
40.0000 mg | DELAYED_RELEASE_TABLET | Freq: Every day | ORAL | Status: DC
  Administered 2018-06-10: 40 mg via ORAL
  Filled 2018-06-10: qty 1

## 2018-06-10 NOTE — Progress Notes (Addendum)
Noted moderate to large amount of black tarry stool  this shift. Notified K. Sofia PA and received a new order for CBC. Will continue to monitor.

## 2018-06-10 NOTE — Plan of Care (Signed)
  Problem: Health Behavior/Discharge Planning: Goal: Ability to manage health-related needs will improve Outcome: Progressing   Problem: Clinical Measurements: Goal: Ability to maintain clinical measurements within normal limits will improve Outcome: Progressing Goal: Will remain free from infection Outcome: Progressing Goal: Respiratory complications will improve Outcome: Progressing   Problem: Activity: Goal: Risk for activity intolerance will decrease Outcome: Progressing   Problem: Nutrition: Goal: Adequate nutrition will be maintained Outcome: Progressing   Problem: Coping: Goal: Level of anxiety will decrease Outcome: Progressing   Problem: Elimination: Goal: Will not experience complications related to bowel motility Outcome: Progressing Goal: Will not experience complications related to urinary retention Outcome: Progressing   Problem: Pain Managment: Goal: General experience of comfort will improve Outcome: Progressing   Problem: Safety: Goal: Ability to remain free from injury will improve Outcome: Progressing   Problem: Skin Integrity: Goal: Risk for impaired skin integrity will decrease Outcome: Progressing   Problem: Education: Goal: Ability to identify signs and symptoms of gastrointestinal bleeding will improve Outcome: Progressing   Problem: Bowel/Gastric: Goal: Will show no signs and symptoms of gastrointestinal bleeding Outcome: Progressing   Problem: Fluid Volume: Goal: Will show no signs and symptoms of excessive bleeding Outcome: Progressing   Problem: Clinical Measurements: Goal: Complications related to the disease process, condition or treatment will be avoided or minimized Outcome: Progressing

## 2018-06-10 NOTE — Progress Notes (Signed)
PROGRESS NOTE        PATIENT DETAILS Name: Darlene Wilson Age: 85 y.o. Sex: female Date of Birth: Apr 21, 1927 Admit Date: 06/08/2018 Admitting Physician John Giovanni, MD [id number], Bryon Lions, MD  Brief Narrative: Patient is a 85 y.o. female with history of dementia, CVA on Aggrenox, hypothyroidism, hypertension-presented from her memory care unit for melanotic stools, found to have acute blood loss anemia.  Admitted to the hospitalist service.  Subjective: Pleasantly confused-Per nursing staff-no melena hematochezia overnight.  Assessment/Plan: GI bleeding with acute blood loss anemia: GI bleeding seems to have resolved-really uncertain whether this is upper GI bleeding or lower GI bleeding-per GI clinical history is consistent with diverticular bleeding-and patient not a candidate for endoscopic procedures given advanced age/frailty and dementia.  Thankfully GI bleeding seems to have resolved-hemoglobin is stable.  Advance diet-we will stop PPI infusion and change to oral route.  If GI bleeding does not recur-we will transfer back to memory care unit tomorrow.    History of CVA: Pleasantly confused-but nonfocal exam-Aggrenox on hold due to active GI bleeding.  Hypertension: Blood pressure controlled-no further episodes of hypotension-continue to hold all antihypertensives for now.   Hypothyroidism: Continue Synthroid  Dementia with mild delirium: Pleasantly confused-continue Zyprexa  DVT Prophylaxis: SCD's  Code Status: DNR  Family Communication: Left message for patient's daughter Darlene Wilson  Disposition Plan: Remain inpatient-monitor overnight-if no recurrent GI bleeding-back to memory care unit tomorrow  Antimicrobial agents: Anti-infectives (From admission, onward)   None      Procedures: None  CONSULTS:  GI  Time spent: 25- minutes-Greater than 50% of this time was spent in counseling, explanation of diagnosis, planning of further  management, and coordination of care.  MEDICATIONS: Scheduled Meds: . betaxolol  1 drop Both Eyes BID  . latanoprost  1 drop Both Eyes QHS  . levothyroxine  18 mcg Intravenous QAC breakfast  . OLANZapine  2.5 mg Intramuscular QHS  . pantoprazole  40 mg Oral Q1200   Continuous Infusions: . sodium chloride 10 mL/hr at 06/10/18 0945   PRN Meds:.ipratropium-albuterol   PHYSICAL EXAM: Vital signs: Vitals:   06/09/18 1528 06/09/18 2211 06/10/18 0300 06/10/18 0610  BP:  118/61  (!) 129/99  Pulse:  77  77  Resp:  16  18  Temp: 98.4 F (36.9 C) 98.6 F (37 C)  98.6 F (37 C)  TempSrc: Oral Oral  Oral  SpO2:  96%  96%  Weight:   58.5 kg   Height:       Filed Weights   06/09/18 1055 06/10/18 0300  Weight: 62.9 kg 58.5 kg   Body mass index is 21.59 kg/m.   General appearance:Awake, pleasantly confused  eyes:no scleral icterus. HEENT: Atraumatic and Normocephalic Neck: supple, no JVD. Resp:Good air entry bilaterally,no rales or rhonchi CVS: S1 S2 regular GI: Bowel sounds present, Non tender and not distended with no gaurding, rigidity or rebound. Extremities: B/L Lower Ext shows no edema, both legs are warm to touch Neurology:  Non focal Musculoskeletal:No digital cyanosis Skin:No Rash, warm and dry Wounds:N/A  I have personally reviewed following labs and imaging studies  LABORATORY DATA: CBC: Recent Labs  Lab 06/08/18 2010 06/08/18 2031 06/09/18 0330 06/09/18 1321 06/10/18 0502  WBC 7.7  --  6.3 4.6 4.4  HGB 10.4* 10.9* 8.7* 9.2* 9.5*  HCT 33.7* 32.0* 27.6* 29.0* 29.4*  MCV 103.1*  --  100.7* 102.8* 100.7*  PLT 496*  --  419* 442* 422*    Basic Metabolic Panel: Recent Labs  Lab 06/08/18 2010 06/08/18 2031 06/09/18 0330 06/10/18 0502  NA 136 137 136 140  K 4.6 4.6 4.0 3.0*  CL 107 107 112* 110  CO2 22  --  20* 24  GLUCOSE 160* 160* 93 87  BUN 20 20 22  7*  CREATININE 0.96 0.90 0.82 0.82  CALCIUM 8.6*  --  7.6* 8.2*    GFR: Estimated  Creatinine Clearance: 41.5 mL/min (by C-G formula based on SCr of 0.82 mg/dL).  Liver Function Tests: Recent Labs  Lab 06/08/18 2010  AST 18  ALT 14  ALKPHOS 52  BILITOT 0.4  PROT 5.7*  ALBUMIN 2.4*   No results for input(s): LIPASE, AMYLASE in the last 168 hours. No results for input(s): AMMONIA in the last 168 hours.  Coagulation Profile: No results for input(s): INR, PROTIME in the last 168 hours.  Cardiac Enzymes: No results for input(s): CKTOTAL, CKMB, CKMBINDEX, TROPONINI in the last 168 hours.  BNP (last 3 results) No results for input(s): PROBNP in the last 8760 hours.  HbA1C: No results for input(s): HGBA1C in the last 72 hours.  CBG: No results for input(s): GLUCAP in the last 168 hours.  Lipid Profile: No results for input(s): CHOL, HDL, LDLCALC, TRIG, CHOLHDL, LDLDIRECT in the last 72 hours.  Thyroid Function Tests: No results for input(s): TSH, T4TOTAL, FREET4, T3FREE, THYROIDAB in the last 72 hours.  Anemia Panel: No results for input(s): VITAMINB12, FOLATE, FERRITIN, TIBC, IRON, RETICCTPCT in the last 72 hours.  Urine analysis:    Component Value Date/Time   COLORURINE YELLOW 05/24/2018 1702   APPEARANCEUR CLOUDY (A) 05/24/2018 1702   LABSPEC 1.016 05/24/2018 1702   PHURINE 7.0 05/24/2018 1702   GLUCOSEU NEGATIVE 05/24/2018 1702   HGBUR NEGATIVE 05/24/2018 1702   BILIRUBINUR NEGATIVE 05/24/2018 1702   BILIRUBINUR neg 05/31/2012 1112   KETONESUR 5 (A) 05/24/2018 1702   PROTEINUR NEGATIVE 05/24/2018 1702   UROBILINOGEN 1.0 03/08/2015 1526   NITRITE NEGATIVE 05/24/2018 1702   LEUKOCYTESUR NEGATIVE 05/24/2018 1702    Sepsis Labs: Lactic Acid, Venous    Component Value Date/Time   LATICACIDVEN 1.14 05/24/2018 1542    MICROBIOLOGY: Recent Results (from the past 240 hour(s))  MRSA PCR Screening     Status: None   Collection Time: 06/09/18 12:49 AM  Result Value Ref Range Status   MRSA by PCR NEGATIVE NEGATIVE Final    Comment:        The  GeneXpert MRSA Assay (FDA approved for NASAL specimens only), is one component of a comprehensive MRSA colonization surveillance program. It is not intended to diagnose MRSA infection nor to guide or monitor treatment for MRSA infections. Performed at [facility], [address] [place], [place] [address]     RADIOLOGY STUDIES/RESULTS: Dg Chest 2 View  Result Date: 05/24/2018 CLINICAL DATA:  Dyspnea with increasing lethargy and fever for a few days. EXAM: CHEST - 2 VIEW COMPARISON:  None. FINDINGS: Normal heart size and mediastinal contours with aortic atherosclerosis. Slight uncoiling of the thoracic aorta is identified. Patchy pulmonary consolidation in the right upper lobe distribution is noted consistent with pneumonia. Left lung appears clear. No acute osseous abnormality is noted. IMPRESSION: Right upper lobe pneumonia. Followup PA and lateral chest X-ray is recommended in 3-4 weeks following trial of antibiotic therapy to ensure resolution and exclude underlying malignancy. Electronically Signed   By: Onalee Huaavid  Sterling BigKwon M.D.   On: 05/24/2018 14:53   Ct Angio Chest Pe W Or Wo Contrast  Result Date: 05/24/2018 CLINICAL DATA:  Shortness of breath and lethargy EXAM: CT ANGIOGRAPHY CHEST WITH CONTRAST TECHNIQUE: Multidetector CT imaging of the chest was performed using the standard protocol during bolus administration of intravenous contrast. Multiplanar CT image reconstructions and MIPs were obtained to evaluate the vascular anatomy. Examination is somewhat limited by patient motion artifact. CONTRAST:  100mL ISOVUE-370 IOPAMIDOL (ISOVUE-370) INJECTION 76% COMPARISON:  None. FINDINGS: Cardiovascular: Thoracic aorta demonstrates diffuse atherosclerotic calcifications without aneurysmal dilatation or dissection. No cardiac enlargement is seen. Mild coronary calcifications are noted. The pulmonary artery is well visualized centrally without evidence pulmonary emboli. The peripheral branches  are somewhat bolus as well as patient motion artifact during the exam. Mediastinum/Nodes: The esophagus is within normal limits. No hilar or mediastinal adenopathy is seen. The thoracic inlet is unremarkable. Lungs/Pleura: Left lung is well aerated without focal infiltrate or sizable effusion. The right lung demonstrates multifocal infiltrate consistent with acute pneumonia particularly within the right upper lobe but to a lesser degree in the right lower lobe. Small right effusion is seen. Upper Abdomen: Visualized upper abdomen is within normal limits. Musculoskeletal: Degenerative changes of the thoracic spine noted. Review of the MIP images confirms the above findings. IMPRESSION: No definitive pulmonary emboli are seen although peripheral branches are somewhat limited due to patient motion artifact. Patchy multifocal infiltrates involving the right upper and right lower lobes. Small right pleural effusion. Aortic Atherosclerosis (ICD10-I70.0). Electronically Signed   By: Alcide CleverMark  Lukens M.D.   On: 05/24/2018 21:47   Dg Swallowing Func-speech Pathology  Result Date: 05/28/2018 Objective Swallowing Evaluation: Type of Study: MBS-Modified Barium Swallow Study  Patient Details Name: Frederik SchmidtJanie B Poland MRN: [id number] Date of Birth: 1926/08/22 Today's Date: 05/28/2018 Time: SLP Start Time (ACUTE ONLY): 1235 -SLP Stop Time (ACUTE ONLY): 1255 SLP Time Calculation (min) (ACUTE ONLY): 20 min Past Medical History: Past Medical History: Diagnosis Date . Aortic atherosclerosis (HCC) 05/26/2018 . Chronic kidney disease 1998  stones . Depression with anxiety 09/14/2014 . Dyspnea  . Fibrocystic breast 01/12/2011 . Glaucoma   both eyes . History of chicken pox  . Hypertension  . Insomnia 01/13/2011 . Kidney stone   stones  . Memory loss 10/12/2012 . Nausea 05/31/2012 . Spinal stenosis 06/14/2011 . Stroke (HCC) 04/2007  x 3 total . Thyroid disease   hypothyroid . Urinary frequency 02/09/2011 . Vertigo 02/09/2011 Past Surgical History: Past  Surgical History: Procedure Laterality Date . ABDOMINAL HYSTERECTOMY  1963 . BACK SURGERY  1989  discectomy L4-5 1989, good results . BREAST SURGERY  1970  biopsy, benign, b/l HPI: 85 y.o. female with Past medical history of CVA, dementia, hypothyroidism, depression, HTN. Admitted with pna  Subjective: alert Assessment / Plan / Recommendation CHL IP CLINICAL IMPRESSIONS 05/28/2018 Clinical Impression Pt presents with functional oropharyngeal swallow marked by adequate mastication, reliable laryngeal vestibule closure with no penetration/aspiration; no residue post-swallow with normal pharyngeal squeeze.  Material passed through UES with no resistance.  No indication of a dysphagia-related aspiration pna.  Continue regular diet, thin liquids; no SLP f/u warranted.  SLP Visit Diagnosis Dysphagia, unspecified (R13.10) Attention and concentration deficit following -- Frontal lobe and executive function deficit following -- Impact on safety and function No limitations   CHL IP TREATMENT RECOMMENDATION 05/28/2018 Treatment Recommendations No treatment recommended at this time   Prognosis 05/25/2018 Prognosis for Safe Diet Advancement Good Barriers to Reach Goals Cognitive deficits Barriers/Prognosis Comment -- CHL  IP DIET RECOMMENDATION 05/28/2018 SLP Diet Recommendations Regular solids;Thin liquid Liquid Administration via Straw;Cup Medication Administration Whole meds with liquid Compensations -- Postural Changes --   CHL IP OTHER RECOMMENDATIONS 05/28/2018 Recommended Consults -- Oral Care Recommendations Oral care BID Other Recommendations --   CHL IP FOLLOW UP RECOMMENDATIONS 05/28/2018 Follow up Recommendations None   CHL IP FREQUENCY AND DURATION 05/25/2018 Speech Therapy Frequency (ACUTE ONLY) min 1 x/week Treatment Duration 1 week      CHL IP ORAL PHASE 05/28/2018 Oral Phase WFL Oral - Pudding Teaspoon -- Oral - Pudding Cup -- Oral - Honey Teaspoon -- Oral - Honey Cup -- Oral - Nectar Teaspoon -- Oral - Nectar  Cup -- Oral - Nectar Straw -- Oral - Thin Teaspoon -- Oral - Thin Cup -- Oral - Thin Straw -- Oral - Puree -- Oral - Mech Soft -- Oral - Regular -- Oral - Multi-Consistency -- Oral - Pill -- Oral Phase - Comment --  CHL IP PHARYNGEAL PHASE 05/28/2018 Pharyngeal Phase WFL Pharyngeal- Pudding Teaspoon -- Pharyngeal -- Pharyngeal- Pudding Cup -- Pharyngeal -- Pharyngeal- Honey Teaspoon -- Pharyngeal -- Pharyngeal- Honey Cup -- Pharyngeal -- Pharyngeal- Nectar Teaspoon -- Pharyngeal -- Pharyngeal- Nectar Cup -- Pharyngeal -- Pharyngeal- Nectar Straw -- Pharyngeal -- Pharyngeal- Thin Teaspoon -- Pharyngeal -- Pharyngeal- Thin Cup -- Pharyngeal -- Pharyngeal- Thin Straw -- Pharyngeal -- Pharyngeal- Puree -- Pharyngeal -- Pharyngeal- Mechanical Soft -- Pharyngeal -- Pharyngeal- Regular -- Pharyngeal -- Pharyngeal- Multi-consistency -- Pharyngeal -- Pharyngeal- Pill -- Pharyngeal -- Pharyngeal Comment --  CHL IP CERVICAL ESOPHAGEAL PHASE 05/28/2018 Cervical Esophageal Phase WFL Pudding Teaspoon -- Pudding Cup -- Honey Teaspoon -- Honey Cup -- Nectar Teaspoon -- Nectar Cup -- Nectar Straw -- Thin Teaspoon -- Thin Cup -- Thin Straw -- Puree -- Mechanical Soft -- Regular -- Multi-consistency -- Pill -- Cervical Esophageal Comment -- Blenda Mounts Laurice 05/28/2018, 12:57 PM                LOS: 0 days   Jeoffrey Massed, MD  [facility]  If 7PM-7AM, please contact night-coverage  Please page via [email or web address]-Password [email or web address]-click on MD name and type text message  06/10/2018, 11:48 AM

## 2018-06-10 NOTE — Progress Notes (Signed)
Darlene Wilson  Darlene Wilson 85 y.o. [birth year]   Subjective: No further bleeding. Patient demented.  Objective: Vital signs: Vitals:   06/09/18 2211 06/10/18 0610  BP: 118/61 (!) 129/99  Pulse: 77 77  Resp: 16 18  Temp: 98.6 F (37 C) 98.6 F (37 C)  SpO2: 96% 96%    Physical Exam: Gen: demented, elderly, frail, no acute distress  HEENT: anicteric sclera CV: RRR Chest: CTA B Abd: soft, nontender, nondistended, +BS Ext: no edema  Lab Results: Recent Labs    06/09/18 0330 06/10/18 0502  NA 136 140  K 4.0 3.0*  CL 112* 110  CO2 20* 24  GLUCOSE 93 87  BUN 22 7*  CREATININE 0.82 0.82  CALCIUM 7.6* 8.2*   Recent Labs    06/08/18 2010  AST 18  ALT 14  ALKPHOS 52  BILITOT 0.4  PROT 5.7*  ALBUMIN 2.4*   Recent Labs    06/09/18 1321 06/10/18 0502  WBC 4.6 4.4  HGB 9.2* 9.5*  HCT 29.0* 29.4*  MCV 102.8* 100.7*  PLT 442* 422*      Assessment/Plan: S/P GI bleed likely due to a diverticular bleed that has resolved. No plan for endoscopic procedures. Will sign off. No GI f/u needed. Call if questions.   Shirley Friar 06/10/2018, 3:34 PM  Questions please call [phone] ID: Darlene Wilson, female   DOB: 12-25-1926, 85 y.o.   MRN: [id number]

## 2018-06-11 DIAGNOSIS — Z8249 Family history of ischemic heart disease and other diseases of the circulatory system: Secondary | ICD-10-CM | POA: Diagnosis not present

## 2018-06-11 DIAGNOSIS — Z881 Allergy status to other antibiotic agents status: Secondary | ICD-10-CM | POA: Diagnosis not present

## 2018-06-11 DIAGNOSIS — I959 Hypotension, unspecified: Secondary | ICD-10-CM | POA: Diagnosis present

## 2018-06-11 DIAGNOSIS — Z882 Allergy status to sulfonamides status: Secondary | ICD-10-CM | POA: Diagnosis not present

## 2018-06-11 DIAGNOSIS — F0280 Dementia in other diseases classified elsewhere without behavioral disturbance: Secondary | ICD-10-CM | POA: Diagnosis present

## 2018-06-11 DIAGNOSIS — Z880 Allergy status to penicillin: Secondary | ICD-10-CM | POA: Diagnosis not present

## 2018-06-11 DIAGNOSIS — Z818 Family history of other mental and behavioral disorders: Secondary | ICD-10-CM | POA: Diagnosis not present

## 2018-06-11 DIAGNOSIS — Z87442 Personal history of urinary calculi: Secondary | ICD-10-CM | POA: Diagnosis not present

## 2018-06-11 DIAGNOSIS — E039 Hypothyroidism, unspecified: Secondary | ICD-10-CM | POA: Diagnosis present

## 2018-06-11 DIAGNOSIS — I129 Hypertensive chronic kidney disease with stage 1 through stage 4 chronic kidney disease, or unspecified chronic kidney disease: Secondary | ICD-10-CM | POA: Diagnosis present

## 2018-06-11 DIAGNOSIS — Z833 Family history of diabetes mellitus: Secondary | ICD-10-CM | POA: Diagnosis not present

## 2018-06-11 DIAGNOSIS — N182 Chronic kidney disease, stage 2 (mild): Secondary | ICD-10-CM | POA: Diagnosis present

## 2018-06-11 DIAGNOSIS — K922 Gastrointestinal hemorrhage, unspecified: Secondary | ICD-10-CM | POA: Diagnosis present

## 2018-06-11 DIAGNOSIS — Z8673 Personal history of transient ischemic attack (TIA), and cerebral infarction without residual deficits: Secondary | ICD-10-CM | POA: Diagnosis not present

## 2018-06-11 DIAGNOSIS — F418 Other specified anxiety disorders: Secondary | ICD-10-CM | POA: Diagnosis present

## 2018-06-11 DIAGNOSIS — Z885 Allergy status to narcotic agent status: Secondary | ICD-10-CM | POA: Diagnosis not present

## 2018-06-11 DIAGNOSIS — I10 Essential (primary) hypertension: Secondary | ICD-10-CM | POA: Diagnosis not present

## 2018-06-11 DIAGNOSIS — Z79890 Hormone replacement therapy (postmenopausal): Secondary | ICD-10-CM | POA: Diagnosis not present

## 2018-06-11 DIAGNOSIS — Z888 Allergy status to other drugs, medicaments and biological substances status: Secondary | ICD-10-CM | POA: Diagnosis not present

## 2018-06-11 DIAGNOSIS — Z90710 Acquired absence of both cervix and uterus: Secondary | ICD-10-CM | POA: Diagnosis not present

## 2018-06-11 DIAGNOSIS — H409 Unspecified glaucoma: Secondary | ICD-10-CM | POA: Diagnosis present

## 2018-06-11 DIAGNOSIS — G309 Alzheimer's disease, unspecified: Secondary | ICD-10-CM | POA: Diagnosis present

## 2018-06-11 DIAGNOSIS — K5711 Diverticulosis of small intestine without perforation or abscess with bleeding: Secondary | ICD-10-CM | POA: Diagnosis present

## 2018-06-11 DIAGNOSIS — D62 Acute posthemorrhagic anemia: Secondary | ICD-10-CM | POA: Diagnosis present

## 2018-06-11 DIAGNOSIS — N6019 Diffuse cystic mastopathy of unspecified breast: Secondary | ICD-10-CM | POA: Diagnosis present

## 2018-06-11 DIAGNOSIS — I700 Atherosclerosis of aorta: Secondary | ICD-10-CM | POA: Diagnosis present

## 2018-06-11 LAB — CBC
HCT: 21.7 % — ABNORMAL LOW (ref 36.0–46.0)
HEMATOCRIT: 34.6 % — AB (ref 36.0–46.0)
Hemoglobin: 7 g/dL — ABNORMAL LOW (ref 12.0–15.0)
Hemoglobin: 11.2 g/dL — ABNORMAL LOW (ref 12.0–15.0)
MCH: 33 pg (ref 26.0–34.0)
MCH: 29.6 pg (ref 26.0–34.0)
MCHC: 32.3 g/dL (ref 30.0–36.0)
MCHC: 32.4 g/dL (ref 30.0–36.0)
MCV: 102.4 fL — ABNORMAL HIGH (ref 80.0–100.0)
MCV: 91.5 fL (ref 80.0–100.0)
Platelets: 308 10*3/uL (ref 150–400)
Platelets: 250 10*3/uL (ref 150–400)
RBC: 2.12 MIL/uL — ABNORMAL LOW (ref 3.87–5.11)
RBC: 3.78 MIL/uL — ABNORMAL LOW (ref 3.87–5.11)
RDW: 14.8 % (ref 11.5–15.5)
RDW: 17.9 % — ABNORMAL HIGH (ref 11.5–15.5)
WBC: 4.7 10*3/uL (ref 4.0–10.5)
WBC: 5.1 10*3/uL (ref 4.0–10.5)
nRBC: 0 % (ref 0.0–0.2)
nRBC: 0 % (ref 0.0–0.2)

## 2018-06-11 LAB — CBC WITH DIFFERENTIAL/PLATELET
Abs Immature Granulocytes: 0.02 10*3/uL (ref 0.00–0.07)
BASOS PCT: 1 %
Basophils Absolute: 0 10*3/uL (ref 0.0–0.1)
Eosinophils Absolute: 0.2 10*3/uL (ref 0.0–0.5)
Eosinophils Relative: 4 %
HCT: 23.2 % — ABNORMAL LOW (ref 36.0–46.0)
Hemoglobin: 7.3 g/dL — ABNORMAL LOW (ref 12.0–15.0)
IMMATURE GRANULOCYTES: 0 %
Lymphocytes Relative: 30 %
Lymphs Abs: 1.7 10*3/uL (ref 0.7–4.0)
MCH: 32.2 pg (ref 26.0–34.0)
MCHC: 31.5 g/dL (ref 30.0–36.0)
MCV: 102.2 fL — ABNORMAL HIGH (ref 80.0–100.0)
Monocytes Absolute: 0.7 10*3/uL (ref 0.1–1.0)
Monocytes Relative: 12 %
NEUTROS PCT: 53 %
Neutro Abs: 3.1 10*3/uL (ref 1.7–7.7)
Platelets: 329 10*3/uL (ref 150–400)
RBC: 2.27 MIL/uL — ABNORMAL LOW (ref 3.87–5.11)
RDW: 14.6 % (ref 11.5–15.5)
WBC: 5.7 10*3/uL (ref 4.0–10.5)
nRBC: 0 % (ref 0.0–0.2)

## 2018-06-11 LAB — PREPARE RBC (CROSSMATCH)

## 2018-06-11 LAB — TYPE AND SCREEN
ABO/RH(D): O NEG
Antibody Screen: NEGATIVE

## 2018-06-11 MED ORDER — SODIUM CHLORIDE 0.9% IV SOLUTION
Freq: Once | INTRAVENOUS | Status: AC
  Administered 2018-06-11: 09:00:00 via INTRAVENOUS

## 2018-06-11 MED ORDER — DIPHENHYDRAMINE HCL 50 MG/ML IJ SOLN
25.0000 mg | Freq: Once | INTRAMUSCULAR | Status: AC
  Administered 2018-06-11: 25 mg via INTRAVENOUS
  Filled 2018-06-11: qty 1

## 2018-06-11 MED ORDER — ACETAMINOPHEN 325 MG PO TABS
650.0000 mg | ORAL_TABLET | Freq: Once | ORAL | Status: AC
  Administered 2018-06-11: 650 mg via ORAL
  Filled 2018-06-11: qty 2

## 2018-06-11 MED ORDER — PANTOPRAZOLE SODIUM 40 MG IV SOLR
40.0000 mg | Freq: Two times a day (BID) | INTRAVENOUS | Status: DC
  Administered 2018-06-11 – 2018-06-14 (×7): 40 mg via INTRAVENOUS
  Filled 2018-06-11 (×6): qty 40

## 2018-06-11 MED ORDER — FUROSEMIDE 10 MG/ML IJ SOLN
20.0000 mg | Freq: Once | INTRAMUSCULAR | Status: AC
  Administered 2018-06-11: 20 mg via INTRAVENOUS
  Filled 2018-06-11: qty 2

## 2018-06-11 NOTE — H&P (View-Only) (Signed)
[facility] Progress Note  Darlene Wilson 85 y.o. 12/05/1926  CC: GI bleed   Subjective: Received call from primary physician.  Patient continues to have episodes of melena and drop in hemoglobin.  No bright red blood per rectum.  Patient seen and examined at bedside.  Somewhat lethargic.  Denies abdominal pain, nausea vomiting.  ROS : Negative for chest pain.  Positive for weakness and fatigue.   Objective: Vital signs in last 24 hours: Vitals:   06/11/18 0911 06/11/18 1230  BP: (!) 99/51 (!) 99/48  Pulse: 77 76  Resp: 18 18  Temp: 98.4 F (36.9 C) 97.9 F (36.6 C)  SpO2: 99% 97%    Physical Exam:  General.  Somewhat lethargic.  Alert and oriented X 1 Heart.  Rate rhythm regular. Abdomen.  Soft, nontender, nondistended, bowel sounds present.  Lab Results: Recent Labs    06/09/18 0330 06/10/18 0502  NA 136 140  K 4.0 3.0*  CL 112* 110  CO2 20* 24  GLUCOSE 93 87  BUN 22 7*  CREATININE 0.82 0.82  CALCIUM 7.6* 8.2*   Recent Labs    06/08/18 2010  AST 18  ALT 14  ALKPHOS 52  BILITOT 0.4  PROT 5.7*  ALBUMIN 2.4*   Recent Labs    06/11/18 0005 06/11/18 0425  WBC 5.7 4.7  NEUTROABS 3.1  --   HGB 7.3* 7.0*  HCT 23.2* 21.7*  MCV 102.2* 102.4*  PLT 329 308   No results for input(s): LABPROT, INR in the last 72 hours.    Assessment/Plan: -Melena -GI bleed. ??  Upper GI bleed versus blood clots from lower GI bleed. -Acute blood loss anemia  Recommendations ------------------------- -EGD tomorrow.  Discussed with patient's son over the phone who verbalized understanding. -Monitor H&H.  Continue IV twice daily PPI. - N P.o. past midnight.  GI will follow   Mykenzi Vanzile MD, FACP 06/11/2018, 12:56 PM  Contact #  [phone] 

## 2018-06-11 NOTE — Progress Notes (Signed)
Sofia PA called back and indicated that I should pass it on to the incoming RN to notify the morning rounding provider to address the drop in Hgb. Patient doesn't have informed consent for blood. Patient is confused and can't sign a  consent.

## 2018-06-11 NOTE — Anesthesia Preprocedure Evaluation (Addendum)
Anesthesia Evaluation  Patient identified by MRN, date of birth, ID band Patient awake    Reviewed: Allergy & Precautions, NPO status , Patient's Chart, lab work & pertinent test results  Airway Mallampati: II  TM Distance: >3 FB Neck ROM: Full    Dental no notable dental hx. (+) Dental Advisory Given, Poor Dentition,    Pulmonary neg pulmonary ROS,    Pulmonary exam normal breath sounds clear to auscultation       Cardiovascular hypertension, Pt. on medications Normal cardiovascular exam+ dysrhythmias Atrial Fibrillation  Rhythm:Regular Rate:Normal     Neuro/Psych Dementia CVA    GI/Hepatic negative GI ROS, Neg liver ROS,   Endo/Other  negative endocrine ROS  Renal/GU ARFRenal disease     Musculoskeletal  (+) Arthritis ,   Abdominal   Peds  Hematology negative hematology ROS (+)   Anesthesia Other Findings   Reproductive/Obstetrics                            Anesthesia Physical Anesthesia Plan  ASA: IV  Anesthesia Plan: MAC   Post-op Pain Management:    Induction: Intravenous  PONV Risk Score and Plan: Treatment may vary due to age or medical condition  Airway Management Planned: Nasal Cannula and Natural Airway  Additional Equipment:   Intra-op Plan:   Post-operative Plan:   Informed Consent: I have reviewed the patients History and Physical, chart, labs and discussed the procedure including the risks, benefits and alternatives for the proposed anesthesia with the patient or authorized representative who has indicated his/her understanding and acceptance.   Dental advisory given  Plan Discussed with: CRNA  Anesthesia Plan Comments:        Anesthesia Quick Evaluation

## 2018-06-11 NOTE — Progress Notes (Signed)
PROGRESS NOTE        PATIENT DETAILS Name: Darlene Wilson Age: 85 y.o. Sex: female Date of Birth: 04-Jun-1926 Admit Date: 06/08/2018 Admitting Physician John Giovanni, MD QMV:HQION, Bryon Lions, MD  Brief Narrative: Patient is a 85 y.o. female with history of dementia, CVA on Aggrenox, hypothyroidism, hypertension-presented from her memory care unit for melanotic stools, found to have acute blood loss anemia.  Admitted to the hospitalist service.  Subjective: Multiple melanotic stools overnight-hemoglobin dropped to 7-getting 2 units of PRBC this morning.  Assessment/Plan: GI bleeding with acute blood loss anemia: Melanotic stools reoccurred last night-probably upper GI bleeding-initially thought to have diverticular bleeding.  Restart IV PPI-transfusing 2 units of PRBC.  GI reconsulted.     History of CVA: Pleasantly confused-nonfocal-Aggrenox on hold-supportive care.   Hypertension: Blood pressure remains soft-continue to hold antihypertensives.  Hypothyroidism: Continue Synthroid  Dementia with mild delirium: Recently confused-continue Zyprexa  DVT Prophylaxis: SCD's  Code Status: DNR  Family Communication: Spoke with patient's son over the phone  Disposition Plan: Remain inpatient-monitor overnight-if no recurrent GI bleeding-back to memory care unit tomorrow  Antimicrobial agents: Anti-infectives (From admission, onward)   None      Procedures: None  CONSULTS:  GI  Time spent: 25- minutes-Greater than 50% of this time was spent in counseling, explanation of diagnosis, planning of further management, and coordination of care.  MEDICATIONS: Scheduled Meds: . betaxolol  1 drop Both Eyes BID  . donepezil  10 mg Oral QHS  . gabapentin  100 mg Oral QHS  . latanoprost  1 drop Both Eyes QHS  . levothyroxine  37.5 mcg Oral Daily  . OLANZapine  2.5 mg Intramuscular QHS  . pantoprazole (PROTONIX) IV  40 mg Intravenous Q12H    Continuous Infusions: . sodium chloride Stopped (06/11/18 0902)   PRN Meds:.ipratropium-albuterol   PHYSICAL EXAM: Vital signs: Vitals:   06/10/18 2111 06/11/18 0517 06/11/18 0850 06/11/18 0911  BP: (!) 99/57 92/60 (!) 98/53 (!) 99/51  Pulse: 90 69 79 77  Resp: 18 18 18 18   Temp: 98.6 F (37 C) 97.8 F (36.6 C) 99.1 F (37.3 C) 98.4 F (36.9 C)  TempSrc: Oral  Oral Oral  SpO2: 98% 98% 98% 99%  Weight:      Height:       Filed Weights   06/09/18 1055 06/10/18 0300  Weight: 62.9 kg 58.5 kg   Body mass index is 21.59 kg/m.   General appearance:Awake, pleasantly confused Eyes:no scleral icterus. HEENT: Atraumatic and Normocephalic Neck: supple, no JVD. Resp:Good air entry bilaterally, clear to auscultation anteriorly CVS: S1 S2 regular GI: Bowel sounds present, Non tender and not distended with no gaurding, rigidity or rebound. Extremities: B/L Lower Ext shows no edema, both legs are warm to touch Neurology: Moves all 4 extremities-difficult exam due to dementia-but appears to be nonfocal Musculoskeletal:No digital cyanosis Skin:No Rash, warm and dry Wounds:N/A  I have personally reviewed following labs and imaging studies  LABORATORY DATA: CBC: Recent Labs  Lab 06/09/18 0330 06/09/18 1321 06/10/18 0502 06/11/18 0005 06/11/18 0425  WBC 6.3 4.6 4.4 5.7 4.7  NEUTROABS  --   --   --  3.1  --   HGB 8.7* 9.2* 9.5* 7.3* 7.0*  HCT 27.6* 29.0* 29.4* 23.2* 21.7*  MCV 100.7* 102.8* 100.7* 102.2* 102.4*  PLT 419* 442* 422* 329 308  Basic Metabolic Panel: Recent Labs  Lab 06/08/18 2010 06/08/18 2031 06/09/18 0330 06/10/18 0502  NA 136 137 136 140  K 4.6 4.6 4.0 3.0*  CL 107 107 112* 110  CO2 22  --  20* 24  GLUCOSE 160* 160* 93 87  BUN 20 20 22  7*  CREATININE 0.96 0.90 0.82 0.82  CALCIUM 8.6*  --  7.6* 8.2*    GFR: Estimated Creatinine Clearance: 41.5 mL/min (by C-G formula based on SCr of 0.82 mg/dL).  Liver Function Tests: Recent Labs  Lab  06/08/18 2010  AST 18  ALT 14  ALKPHOS 52  BILITOT 0.4  PROT 5.7*  ALBUMIN 2.4*   No results for input(s): LIPASE, AMYLASE in the last 168 hours. No results for input(s): AMMONIA in the last 168 hours.  Coagulation Profile: No results for input(s): INR, PROTIME in the last 168 hours.  Cardiac Enzymes: No results for input(s): CKTOTAL, CKMB, CKMBINDEX, TROPONINI in the last 168 hours.  BNP (last 3 results) No results for input(s): PROBNP in the last 8760 hours.  HbA1C: No results for input(s): HGBA1C in the last 72 hours.  CBG: No results for input(s): GLUCAP in the last 168 hours.  Lipid Profile: No results for input(s): CHOL, HDL, LDLCALC, TRIG, CHOLHDL, LDLDIRECT in the last 72 hours.  Thyroid Function Tests: No results for input(s): TSH, T4TOTAL, FREET4, T3FREE, THYROIDAB in the last 72 hours.  Anemia Panel: No results for input(s): VITAMINB12, FOLATE, FERRITIN, TIBC, IRON, RETICCTPCT in the last 72 hours.  Urine analysis:    Component Value Date/Time   COLORURINE YELLOW 05/24/2018 1702   APPEARANCEUR CLOUDY (A) 05/24/2018 1702   LABSPEC 1.016 05/24/2018 1702   PHURINE 7.0 05/24/2018 1702   GLUCOSEU NEGATIVE 05/24/2018 1702   HGBUR NEGATIVE 05/24/2018 1702   BILIRUBINUR NEGATIVE 05/24/2018 1702   BILIRUBINUR neg 05/31/2012 1112   KETONESUR 5 (A) 05/24/2018 1702   PROTEINUR NEGATIVE 05/24/2018 1702   UROBILINOGEN 1.0 03/08/2015 1526   NITRITE NEGATIVE 05/24/2018 1702   LEUKOCYTESUR NEGATIVE 05/24/2018 1702    Sepsis Labs: Lactic Acid, Venous    Component Value Date/Time   LATICACIDVEN 1.14 05/24/2018 1542    MICROBIOLOGY: Recent Results (from the past 240 hour(s))  MRSA PCR Screening     Status: None   Collection Time: 06/09/18 12:49 AM  Result Value Ref Range Status   MRSA by PCR NEGATIVE NEGATIVE Final    Comment:        The GeneXpert MRSA Assay (FDA approved for NASAL specimens only), is one component of a comprehensive MRSA  colonization surveillance program. It is not intended to diagnose MRSA infection nor to guide or monitor treatment for MRSA infections. Performed at [facility], [address] [place], [place] [address]     RADIOLOGY STUDIES/RESULTS: Dg Chest 2 View  Result Date: 05/24/2018 CLINICAL DATA:  Dyspnea with increasing lethargy and fever for a few days. EXAM: CHEST - 2 VIEW COMPARISON:  None. FINDINGS: Normal heart size and mediastinal contours with aortic atherosclerosis. Slight uncoiling of the thoracic aorta is identified. Patchy pulmonary consolidation in the right upper lobe distribution is noted consistent with pneumonia. Left lung appears clear. No acute osseous abnormality is noted. IMPRESSION: Right upper lobe pneumonia. Followup PA and lateral chest X-ray is recommended in 3-4 weeks following trial of antibiotic therapy to ensure resolution and exclude underlying malignancy. Electronically Signed   By: Tollie Ethavid  Kwon M.D.   On: 05/24/2018 14:53   Ct Angio Chest Pe W Or  Wo Contrast  Result Date: 05/24/2018 CLINICAL DATA:  Shortness of breath and lethargy EXAM: CT ANGIOGRAPHY CHEST WITH CONTRAST TECHNIQUE: Multidetector CT imaging of the chest was performed using the standard protocol during bolus administration of intravenous contrast. Multiplanar CT image reconstructions and MIPs were obtained to evaluate the vascular anatomy. Examination is somewhat limited by patient motion artifact. CONTRAST:  ISOVUE-370 IOPAMIDOL (ISOVUE-370) INJECTION 76% COMPARISON:  None. FINDINGS: Cardiovascular: Thoracic aorta demonstrates diffuse atherosclerotic calcifications without aneurysmal dilatation or dissection. No cardiac enlargement is seen. Mild coronary calcifications are noted. The pulmonary artery is well visualized centrally without evidence pulmonary emboli. The peripheral branches are somewhat bolus as well as patient motion artifact during the exam. Mediastinum/Nodes: The esophagus is  within normal limits. No hilar or mediastinal adenopathy is seen. The thoracic inlet is unremarkable. Lungs/Pleura: Left lung is well aerated without focal infiltrate or sizable effusion. The right lung demonstrates multifocal infiltrate consistent with acute pneumonia particularly within the right upper lobe but to a lesser degree in the right lower lobe. Small right effusion is seen. Upper Abdomen: Visualized upper abdomen is within normal limits. Musculoskeletal: Degenerative changes of the thoracic spine noted. Review of the MIP images confirms the above findings. IMPRESSION: No definitive pulmonary emboli are seen although peripheral branches are somewhat limited due to patient motion artifact. Patchy multifocal infiltrates involving the right upper and right lower lobes. Small right pleural effusion. Aortic Atherosclerosis (ICD10-I70.0). Electronically Signed   By: Alcide Clever M.D.   On: 05/24/2018 21:47   Dg Swallowing Func-speech Pathology  Result Date: 05/28/2018 Objective Swallowing Evaluation: Type of Study: MBS-Modified Barium Swallow Study  Patient Details Name: Darlene Wilson MRN: [id number] Date of Birth: 05-Apr-1927 Today's Date: 05/28/2018 Time: SLP Start Time (ACUTE ONLY): 1235 -SLP Stop Time (ACUTE ONLY): 1255 SLP Time Calculation (min) (ACUTE ONLY): 20 min Past Medical History: Past Medical History: Diagnosis Date . Aortic atherosclerosis (HCC) 05/26/2018 . Chronic kidney disease 1998  stones . Depression with anxiety 09/14/2014 . Dyspnea  . Fibrocystic breast 01/12/2011 . Glaucoma   both eyes . History of chicken pox  . Hypertension  . Insomnia 01/13/2011 . Kidney stone   stones  . Memory loss 10/12/2012 . Nausea 05/31/2012 . Spinal stenosis 06/14/2011 . Stroke (HCC) 04/2007  x 3 total . Thyroid disease   hypothyroid . Urinary frequency 02/09/2011 . Vertigo 02/09/2011 Past Surgical History: Past Surgical History: Procedure Laterality Date . ABDOMINAL HYSTERECTOMY  1963 . BACK SURGERY  1989   discectomy L4-5 1989, good results . BREAST SURGERY  1970  biopsy, benign, b/l HPI: 85 y.o. female with Past medical history of CVA, dementia, hypothyroidism, depression, HTN. Admitted with pna  Subjective: alert Assessment / Plan / Recommendation CHL IP CLINICAL IMPRESSIONS 05/28/2018 Clinical Impression Pt presents with functional oropharyngeal swallow marked by adequate mastication, reliable laryngeal vestibule closure with no penetration/aspiration; no residue post-swallow with normal pharyngeal squeeze.  Material passed through UES with no resistance.  No indication of a dysphagia-related aspiration pna.  Continue regular diet, thin liquids; no SLP f/u warranted.  SLP Visit Diagnosis Dysphagia, unspecified (R13.10) Attention and concentration deficit following -- Frontal lobe and executive function deficit following -- Impact on safety and function No limitations   CHL IP TREATMENT RECOMMENDATION 05/28/2018 Treatment Recommendations No treatment recommended at this time   Prognosis 05/25/2018 Prognosis for Safe Diet Advancement Good Barriers to Reach Goals Cognitive deficits Barriers/Prognosis Comment -- CHL IP DIET RECOMMENDATION 05/28/2018 SLP Diet Recommendations Regular solids;Thin liquid Liquid Administration via Straw;Cup Medication  Administration Whole meds with liquid Compensations -- Postural Changes --   CHL IP OTHER RECOMMENDATIONS 05/28/2018 Recommended Consults -- Oral Care Recommendations Oral care BID Other Recommendations --   CHL IP FOLLOW UP RECOMMENDATIONS 05/28/2018 Follow up Recommendations None   CHL IP FREQUENCY AND DURATION 05/25/2018 Speech Therapy Frequency (ACUTE ONLY) min 1 x/week Treatment Duration 1 week      CHL IP ORAL PHASE 05/28/2018 Oral Phase WFL Oral - Pudding Teaspoon -- Oral - Pudding Cup -- Oral - Honey Teaspoon -- Oral - Honey Cup -- Oral - Nectar Teaspoon -- Oral - Nectar Cup -- Oral - Nectar Straw -- Oral - Thin Teaspoon -- Oral - Thin Cup -- Oral - Thin Straw -- Oral  - Puree -- Oral - Mech Soft -- Oral - Regular -- Oral - Multi-Consistency -- Oral - Pill -- Oral Phase - Comment --  CHL IP PHARYNGEAL PHASE 05/28/2018 Pharyngeal Phase WFL Pharyngeal- Pudding Teaspoon -- Pharyngeal -- Pharyngeal- Pudding Cup -- Pharyngeal -- Pharyngeal- Honey Teaspoon -- Pharyngeal -- Pharyngeal- Honey Cup -- Pharyngeal -- Pharyngeal- Nectar Teaspoon -- Pharyngeal -- Pharyngeal- Nectar Cup -- Pharyngeal -- Pharyngeal- Nectar Straw -- Pharyngeal -- Pharyngeal- Thin Teaspoon -- Pharyngeal -- Pharyngeal- Thin Cup -- Pharyngeal -- Pharyngeal- Thin Straw -- Pharyngeal -- Pharyngeal- Puree -- Pharyngeal -- Pharyngeal- Mechanical Soft -- Pharyngeal -- Pharyngeal- Regular -- Pharyngeal -- Pharyngeal- Multi-consistency -- Pharyngeal -- Pharyngeal- Pill -- Pharyngeal -- Pharyngeal Comment --  CHL IP CERVICAL ESOPHAGEAL PHASE 05/28/2018 Cervical Esophageal Phase WFL Pudding Teaspoon -- Pudding Cup -- Honey Teaspoon -- Honey Cup -- Nectar Teaspoon -- Nectar Cup -- Nectar Straw -- Thin Teaspoon -- Thin Cup -- Thin Straw -- Puree -- Mechanical Soft -- Regular -- Multi-consistency -- Pill -- Cervical Esophageal Comment -- Blenda MountsCouture, Amanda Laurice 05/28/2018, 12:57 PM                LOS: 0 days   Jeoffrey MassedShanker Chasey Dull, MD  [facility]  If 7PM-7AM, please contact night-coverage  Please page via [email or web address] [email or web address]-click on MD name and type text message  06/11/2018, 12:25 PM

## 2018-06-11 NOTE — Progress Notes (Addendum)
Hbg dropped from 9.5 to 7.3 and then 7.0  This morning. Paged RobinsonSofia PA and awaiting a call back. Will continue to monitor.

## 2018-06-11 NOTE — Progress Notes (Signed)
[facility] Progress Note  Darlene Wilson 85 y.o. 1927/05/28  CC: GI bleed   Subjective: Received call from primary physician.  Patient continues to have episodes of melena and drop in hemoglobin.  No bright red blood per rectum.  Patient seen and examined at bedside.  Somewhat lethargic.  Denies abdominal pain, nausea vomiting.  ROS : Negative for chest pain.  Positive for weakness and fatigue.   Objective: Vital signs in last 24 hours: Vitals:   06/11/18 0911 06/11/18 1230  BP: (!) 99/51 (!) 99/48  Pulse: 77 76  Resp: 18 18  Temp: 98.4 F (36.9 C) 97.9 F (36.6 C)  SpO2: 99% 97%    Physical Exam:  General.  Somewhat lethargic.  Alert and oriented X 1 Heart.  Rate rhythm regular. Abdomen.  Soft, nontender, nondistended, bowel sounds present.  Lab Results: Recent Labs    06/09/18 0330 06/10/18 0502  NA 136 140  K 4.0 3.0*  CL 112* 110  CO2 20* 24  GLUCOSE 93 87  BUN 22 7*  CREATININE 0.82 0.82  CALCIUM 7.6* 8.2*   Recent Labs    06/08/18 2010  AST 18  ALT 14  ALKPHOS 52  BILITOT 0.4  PROT 5.7*  ALBUMIN 2.4*   Recent Labs    06/11/18 0005 06/11/18 0425  WBC 5.7 4.7  NEUTROABS 3.1  --   HGB 7.3* 7.0*  HCT 23.2* 21.7*  MCV 102.2* 102.4*  PLT 329 308   No results for input(s): LABPROT, INR in the last 72 hours.    Assessment/Plan: -Melena -GI bleed. ??  Upper GI bleed versus blood clots from lower GI bleed. -Acute blood loss anemia  Recommendations ------------------------- -EGD tomorrow.  Discussed with patient's son over the phone who verbalized understanding. -Monitor H&H.  Continue IV twice daily PPI. - N P.o. past midnight.  GI will follow   Kathi Der MD, FACP 06/11/2018, 12:56 PM  Contact #  [phone]

## 2018-06-12 ENCOUNTER — Encounter (HOSPITAL_COMMUNITY): Payer: Self-pay | Admitting: Certified Registered"

## 2018-06-12 ENCOUNTER — Inpatient Hospital Stay (HOSPITAL_COMMUNITY): Payer: Medicare Other | Admitting: Anesthesiology

## 2018-06-12 ENCOUNTER — Encounter (HOSPITAL_COMMUNITY)
Admission: EM | Disposition: A | Discharge status: Skilled Nursing Facility | Payer: Self-pay | Source: Skilled Nursing Facility | Attending: Internal Medicine

## 2018-06-12 HISTORY — PX: ESOPHAGOGASTRODUODENOSCOPY (EGD) WITH PROPOFOL: SHX5813

## 2018-06-12 LAB — TYPE AND SCREEN
ABO/RH(D): O NEG
Antibody Screen: NEGATIVE
UNIT DIVISION: 0
Unit division: 0

## 2018-06-12 LAB — BPAM RBC
BLOOD PRODUCT EXPIRATION DATE: 202001312359
Blood Product Expiration Date: 202001312359
ISSUE DATE / TIME: 202001130839
ISSUE DATE / TIME: 202001131352
Unit Type and Rh: 9500
Unit Type and Rh: 9500

## 2018-06-12 LAB — CBC
HCT: 33.3 % — ABNORMAL LOW (ref 36.0–46.0)
Hemoglobin: 10.7 g/dL — ABNORMAL LOW (ref 12.0–15.0)
MCH: 29.7 pg (ref 26.0–34.0)
MCHC: 32.1 g/dL (ref 30.0–36.0)
MCV: 92.5 fL (ref 80.0–100.0)
NRBC: 0.5 % — AB (ref 0.0–0.2)
Platelets: 227 10*3/uL (ref 150–400)
RBC: 3.6 MIL/uL — ABNORMAL LOW (ref 3.87–5.11)
RDW: 18.6 % — ABNORMAL HIGH (ref 11.5–15.5)
WBC: 4.1 10*3/uL (ref 4.0–10.5)

## 2018-06-12 SURGERY — ESOPHAGOGASTRODUODENOSCOPY (EGD) WITH PROPOFOL
Anesthesia: Monitor Anesthesia Care

## 2018-06-12 MED ORDER — AMLODIPINE BESYLATE 5 MG PO TABS
5.0000 mg | ORAL_TABLET | Freq: Every day | ORAL | Status: DC
  Administered 2018-06-12 – 2018-06-13 (×2): 5 mg via ORAL
  Filled 2018-06-12 (×2): qty 1

## 2018-06-12 MED ORDER — PROPOFOL 500 MG/50ML IV EMUL
INTRAVENOUS | Status: DC | PRN
  Administered 2018-06-12: 75 ug/kg/min via INTRAVENOUS

## 2018-06-12 MED ORDER — PROPOFOL 10 MG/ML IV BOLUS
INTRAVENOUS | Status: DC | PRN
  Administered 2018-06-12 (×2): 10 mg via INTRAVENOUS

## 2018-06-12 MED ORDER — LIDOCAINE HCL (PF) 2 % IJ SOLN
INTRAMUSCULAR | Status: DC | PRN
  Administered 2018-06-12: 40 mg via INTRADERMAL

## 2018-06-12 MED ORDER — ACETAMINOPHEN 325 MG PO TABS
650.0000 mg | ORAL_TABLET | Freq: Four times a day (QID) | ORAL | Status: DC | PRN
  Administered 2018-06-12: 650 mg via ORAL

## 2018-06-12 MED ORDER — LACTATED RINGERS IV SOLN
INTRAVENOUS | Status: DC
  Administered 2018-06-12: 1000 mL via INTRAVENOUS

## 2018-06-12 MED ORDER — SODIUM CHLORIDE 0.9 % IV SOLN: INTRAVENOUS | Status: DC

## 2018-06-12 MED ORDER — BUTAMBEN-TETRACAINE-BENZOCAINE 2-2-14 % EX AERO
INHALATION_SPRAY | CUTANEOUS | Status: DC | PRN
  Administered 2018-06-12: 1 via TOPICAL

## 2018-06-12 SURGICAL SUPPLY — 15 items

## 2018-06-12 NOTE — Brief Op Note (Signed)
06/08/2018 - 06/12/2018  8:42 AM  PATIENT:  Darlene Wilson  85 y.o. female  PRE-OPERATIVE DIAGNOSIS:  Melena  POST-OPERATIVE DIAGNOSIS:  duodenal diverticulum; no active bleeding  PROCEDURE:  Procedure(s): ESOPHAGOGASTRODUODENOSCOPY (EGD) WITH PROPOFOL (N/A)  SURGEON:  Surgeon(s) and Role:    * Seleni Meller, MD - Primary  Findings ---------- -EGD showed large duodenal diverticulum otherwise no evidence of active bleeding, ulcer or varices.  Recommendations ----------------------- -Start clear liquid diet -Monitor H&H. -Continue supportive care. -Consider colonoscopy only if evidence of ongoing bleeding or drop in hemoglobin. -GI will follow  Kathi Der MD, FACP 06/12/2018, 8:43 AM  Contact #  [phone]

## 2018-06-12 NOTE — Anesthesia Postprocedure Evaluation (Signed)
Anesthesia Post Note  Patient: Darlene Wilson  Procedure(s) Performed: ESOPHAGOGASTRODUODENOSCOPY (EGD) WITH PROPOFOL (N/A )     Patient location during evaluation: Endoscopy Anesthesia Type: MAC Level of consciousness: awake and alert Pain management: pain level controlled Vital Signs Assessment: post-procedure vital signs reviewed and stable Respiratory status: spontaneous breathing, nonlabored ventilation, respiratory function stable and patient connected to nasal cannula oxygen Cardiovascular status: stable and blood pressure returned to baseline Postop Assessment: no apparent nausea or vomiting Anesthetic complications: no    Last Vitals:  Vitals:   06/12/18 0850 06/12/18 0855  BP: (!) 98/41 (!) 112/52  Pulse: 73 71  Resp: (!) 24 (!) 25  Temp:    SpO2: 98% 97%    Last Pain:  Vitals:   06/12/18 0855  TempSrc:   PainSc: 0-No pain                 Barnet Glasgow

## 2018-06-12 NOTE — Clinical Social Work Note (Signed)
Clinical Social Work Assessment  Patient Details  Name: Darlene Wilson MRN: [id number] Date of Birth: [birth year]  Date of referral:  06/12/18               Reason for consult:  Facility Placement                Permission sought to share information with:  Facility Medical sales representative, Family Supports Permission granted to share information::  No  Name::     Education administrator::  SNFs  Relationship::  Daughter  Contact Information:  [phone]  Housing/Transportation Living arrangements for the past 2 months:  Assisted Living Facility Source of Information:  Adult Children Patient Interpreter Needed:  None Criminal Activity/Legal Involvement Pertinent to Current Situation/Hospitalization:  No - Comment as needed Significant Relationships:  Adult Children Lives with:  Facility Resident Do you feel safe going back to the place where you live?  No Need for family participation in patient care:  No (Coment)  Care giving concerns:  CSW received consult for possible SNF placement at time of discharge. CSW spoke with patient's daughter regarding PT recommendation of SNF placement at time of discharge. Patient's daughter reported that patient's current ALF is currently unable to care for patient given patient's current physical needs and fall risk. Patient's daughter expressed understanding of PT recommendation and is agreeable to SNF placement at time of discharge. CSW to continue to follow and assist with discharge planning needs.   Social Worker assessment / plan:  CSW spoke with patient's daughter concerning possibility of rehab at [facility] before returning to ALF.  Employment status:  Retired Health and safety inspector:  Medicare PT Recommendations:  Skilled Nursing Facility Information / Referral to community resources:  Skilled Nursing Facility  Patient/Family's Response to care:  Patient's daughter recognizes need for rehab before returning home and is agreeable to a SNF in [place].  Patient's daughter reported preference for [facility] since it would be convenient for family.  Patient/Family's Understanding of and Emotional Response to Diagnosis, Current Treatment, and Prognosis:  Patient/family is realistic regarding therapy needs and expressed being hopeful for SNF placement. Patient's daughter expressed understanding of CSW role and discharge process as well as medical condition. No questions/concerns about plan or treatment.    Emotional Assessment Appearance:  Appears stated age Attitude/Demeanor/Rapport:  Unable to Assess Affect (typically observed):  Unable to Assess Orientation:  Oriented to Self Alcohol / Substance use:  Not Applicable Psych involvement (Current and /or in the community):  No (Comment)  Discharge Needs  Concerns to be addressed:  Care Coordination Readmission within the last 30 days:  Yes Current discharge risk:  None Barriers to Discharge:  Continued Medical Work up   [employer], LCSW 06/12/2018, 5:15 PM

## 2018-06-12 NOTE — Interval H&P Note (Signed)
History and Physical Interval Note:  06/12/2018 8:07 AM  Darlene Wilson  has presented today for surgery, with the diagnosis of Melena  The various methods of treatment have been discussed with the patient and family. After consideration of risks, benefits and other options for treatment, the patient has consented to  Procedure(s): ESOPHAGOGASTRODUODENOSCOPY (EGD) WITH PROPOFOL (N/A) as a surgical intervention .  The patient's history has been reviewed, patient examined, no change in status, stable for surgery.  I have reviewed the patient's chart and labs.  Questions were answered to the patient's satisfaction.     Karsyn Jamie

## 2018-06-12 NOTE — Progress Notes (Signed)
Physical Therapy Treatment Patient Details Name: Darlene Wilson MRN: [id number] DOB: [birth year] Today's Date: 06/12/2018    History of Present Illness Pt is an 85 y/o female admitted from a facility (memory care) secondary to hematochezia and found to have a GIB and hypotension. PMH including but not limited to dementia, CKD, spinal stenosis and vertigo.    PT Comments    Pt performed gait training and pre- gait activities during session.  Pt continues to require +2 assistance at this time.  Plan for SNF remains appropriate.     Follow Up Recommendations  SNF     Equipment Recommendations  None recommended by PT    Recommendations for Other Services       Precautions / Restrictions Precautions Precautions: Fall Restrictions Weight Bearing Restrictions: No    Mobility  Bed Mobility Overal bed mobility: Needs Assistance Bed Mobility: Rolling;Sidelying to Sit Rolling: Min assist;+2 for safety/equipment Sidelying to sit: Min assist;+2 for physical assistance       General bed mobility comments: Cues for foot and hand placement to move to edge of bed and achieve standing.  Transfers Overall transfer level: Needs assistance Equipment used: Rolling walker (2 wheeled) Transfers: Sit to/from Stand Sit to Stand: +2 physical assistance;Min assist         General transfer comment: Cues for hand placement to push from seated surface.  Performed multiple transfer from various surfaces.    Ambulation/Gait Ambulation/Gait assistance: Min assist;+2 physical assistance Gait Distance (Feet): 20 Feet Assistive device: Rolling walker (2 wheeled) Gait Pattern/deviations: Step-through pattern;Decreased stride length;Trunk flexed;Shuffle     General Gait Details: Cues for upper trunk control and to step closer to device.     Stairs             Wheelchair Mobility    Modified Rankin (Stroke Patients Only)       Balance Overall balance assessment: Needs  assistance Sitting-balance support: Feet supported Sitting balance-Leahy Scale: Fair     Standing balance support: Bilateral upper extremity supported Standing balance-Leahy Scale: Poor Standing balance comment: needs assist                            Cognition Arousal/Alertness: Awake/alert Behavior During Therapy: Flat affect Overall Cognitive Status: History of cognitive impairments - at baseline(Pt from memory unit has dementia.  )                                        Exercises      General Comments        Pertinent Vitals/Pain Pain Assessment: No/denies pain    Home Living                      Prior Function            PT Goals (current goals can now be found in the care plan section) Acute Rehab PT Goals Patient Stated Goal: unable to state Potential to Achieve Goals: Fair Progress towards PT goals: Progressing toward goals    Frequency    Min 2X/week      PT Plan Current plan remains appropriate    Co-evaluation              AM-PAC PT "6 Clicks" Mobility   Outcome Measure  Help needed turning from your back to your side while in  a flat bed without using bedrails?: A Lot Help needed moving from lying on your back to sitting on the side of a flat bed without using bedrails?: A Lot Help needed moving to and from a bed to a chair (including a wheelchair)?: A Lot Help needed standing up from a chair using your arms (e.g., wheelchair or bedside chair)?: A Lot Help needed to walk in hospital room?: A Lot Help needed climbing 3-5 steps with a railing? : A Lot 6 Click Score: 12    End of Session Equipment Utilized During Treatment: Gait belt Activity Tolerance: Patient limited by fatigue Patient left: in chair;with call bell/phone within reach;with chair alarm set Nurse Communication: Mobility status PT Visit Diagnosis: Other abnormalities of gait and mobility (R26.89);Muscle weakness (generalized) (M62.81)      Time: [id number] PT Time Calculation (min) (ACUTE ONLY): 15 min  Charges:  $Gait Training: 8-22 mins                     Joycelyn RuaAimee Mamie Hundertmark, PTA Acute Rehabilitation Services Pager [phone] Office [phone]     Val Farnam Artis DelayJ Hilari Wethington 06/12/2018, 5:07 PM

## 2018-06-12 NOTE — NC FL2 (Signed)
[place] MEDICAID FL2 LEVEL OF CARE SCREENING TOOL     IDENTIFICATION  Patient Name: Darlene Wilson Birthdate: August 20, 1926 Sex: female Admission Date (Current Location): 06/08/2018  [facility] and [place] Number:  Producer, television/film/video and Address:  [facility], [address], [place], [place] [address]      Provider Number: [id number]  Attending Physician Name and Address:  Maretta Bees, MD  Relative Name and Phone Number:  Kriste Basque, daughter, [phone]    Current Level of Care: Hospital Recommended Level of Care: Skilled Nursing Facility Prior Approval Number:    Date Approved/Denied:   PASRR Number: [id number]  Discharge Plan: SNF    Current Diagnoses: Patient Active Problem List   Diagnosis Date Noted  . GI bleed 06/08/2018  . CKD (chronic kidney disease) stage 2, GFR 60-89 ml/min 06/08/2018  . Lobar pneumonia (HCC) 05/26/2018  . Aortic atherosclerosis (HCC) 05/26/2018  . Alzheimer disease (HCC) 08/10/2016  . Malnutrition of moderate degree 03/10/2015  . Syncope 03/08/2015  . Glaucoma 03/08/2015  . Hearing loss 09/14/2014  . Depression with anxiety 09/14/2014  . Medicare annual wellness visit, subsequent 04/13/2014  . Hyperglycemia 10/06/2013  . Neck pain 11/20/2012  . Dizziness and giddiness 11/08/2012  . Degeneration of cervical intervertebral disc 11/08/2012  . Cervical spondylosis without myelopathy 11/08/2012  . Other malaise and fatigue 11/08/2012  . Disturbance of skin sensation 11/08/2012  . Hemiplegia of dominant side as late effect following cerebrovascular disease (HCC) 11/08/2012  . Other late effects of cerebrovascular disease(438.89) 11/08/2012  . Memory loss 10/12/2012  . Spinal stenosis 06/14/2011  . Urinary frequency 02/09/2011  . Vertigo 02/09/2011  . Insomnia 01/13/2011  . Fibrocystic breast 01/12/2011  . Hypertension   . Kidney stone   . Thyroid disease   . Stroke (HCC) 04/30/2007    Orientation  RESPIRATION BLADDER Height & Weight     Self  Normal Incontinent Weight: 58.5 kg Height:  5' 4.8" (164.6 cm)  BEHAVIORAL SYMPTOMS/MOOD NEUROLOGICAL BOWEL NUTRITION STATUS      Incontinent Diet(Please see DC Summary)  AMBULATORY STATUS COMMUNICATION OF NEEDS Skin   Extensive Assist Verbally Normal                       Personal Care Assistance Level of Assistance  Bathing, Feeding, Dressing Bathing Assistance: Maximum assistance Feeding assistance: Maximum assistance Dressing Assistance: Maximum assistance     Functional Limitations Info  Sight, Hearing, Speech Sight Info: Adequate Hearing Info: Adequate Speech Info: Adequate    SPECIAL CARE FACTORS FREQUENCY  PT (By licensed PT), OT (By licensed OT)     PT Frequency: 5x/week OT Frequency: 3x/week            Contractures Contractures Info: Not present    Additional Factors Info  Code Status, Allergies Code Status Info: DNR Allergies Info: Ciprofloxacin, Combigan Brimonidine Tartrate-timolol, Detrol Tolterodine Tartrate, Oxycodone, Penicillins, Sulfa Antibiotics           Current Medications (06/12/2018):  This is the current hospital active medication list Current Facility-Administered Medications  Medication Dose Route Frequency Provider Last Rate Last Dose  . 0.9 %  sodium chloride infusion   Intravenous Continuous Maretta Bees, MD   Stopped at 06/11/18 0902  . acetaminophen (TYLENOL) tablet 650 mg  650 mg Oral Q6H PRN Maretta Bees, MD   650 mg at 06/12/18 1142  . amLODipine (NORVASC) tablet 5 mg  5 mg Oral Daily Ghimire, Werner Lean, MD      .  betaxolol (BETOPTIC-S) 0.5 % ophthalmic solution 1 drop  1 drop Both Eyes BID John Giovanni, MD   1 drop at 06/12/18 1046  . donepezil (ARICEPT) tablet 10 mg  10 mg Oral QHS Maretta Bees, MD   10 mg at 06/11/18 2137  . gabapentin (NEURONTIN) capsule 100 mg  100 mg Oral QHS Maretta Bees, MD   100 mg at 06/11/18 2137  . ipratropium-albuterol  (DUONEB) 0.5-2.5 (3) MG/3ML nebulizer solution 3 mL  3 mL Nebulization Q6H PRN John Giovanni, MD      . latanoprost (XALATAN) 0.005 % ophthalmic solution 1 drop  1 drop Both Eyes QHS John Giovanni, MD   1 drop at 06/11/18 2137  . levothyroxine (SYNTHROID, LEVOTHROID) tablet 37.5 mcg  37.5 mcg Oral Daily Maretta Bees, MD   37.5 mcg at 06/12/18 0559  . OLANZapine (ZYPREXA) injection 2.5 mg  2.5 mg Intramuscular QHS John Giovanni, MD   2.5 mg at 06/11/18 2138  . pantoprazole (PROTONIX) injection 40 mg  40 mg Intravenous Q12H Maretta Bees, MD   40 mg at 06/12/18 1049     Discharge Medications: Please see discharge summary for a list of discharge medications.  Relevant Imaging Results:  Relevant Lab Results:   Additional Information SS#: [id number]  Mearl Latin, LCSW

## 2018-06-12 NOTE — Op Note (Addendum)
[facility] Patient Name: Darlene RuddleJanie Wilson Procedure Date : 06/12/2018 MRN: [id number] Attending MD: Kathi DerParag Domino Holten , MD Date of Birth: [birth year] CSN: [id number] Age: 85 Admit Type: Inpatient Procedure:                Upper GI endoscopy Indications:              Melena Providers:                Kathi DerParag Hong Moring, MD, Kandice RobinsonsGuillaume Awaka, Technician,                            Jacquiline DoeJennifer Zhu, RN Referring MD:              Medicines:                Sedation Administered by an Anesthesia Professional Complications:            No immediate complications. Estimated Blood Loss:     Estimated blood loss was minimal. Procedure:                Pre-Anesthesia Assessment:                           - Prior to the procedure, a History and Physical                            was performed, and patient medications and                            allergies were reviewed. The patient's tolerance of                            previous anesthesia was also reviewed. The risks                            and benefits of the procedure and the sedation                            options and risks were discussed with the patient.                            All questions were answered, and informed consent                            was obtained. Prior Anticoagulants: The patient has                            taken no previous anticoagulant or antiplatelet                            agents. ASA Grade Assessment: IV - A patient with                            severe systemic disease that is a constant threat  to life. After reviewing the risks and benefits,                            the patient was deemed in satisfactory condition to                            undergo the procedure.                           After obtaining informed consent, the endoscope was                            passed under direct vision. Throughout the                            procedure, the  patient's blood pressure, pulse, and                            oxygen saturations were monitored continuously. The                            GIF-H190 ([id number]) Olympus gastroscope was                            introduced through the mouth, and advanced to the                            second part of duodenum. The upper GI endoscopy was                            accomplished without difficulty. The patient                            tolerated the procedure well. Scope In: Scope Out: Findings:      No gross lesions were noted in the entire esophagus.      There is no endoscopic evidence of bleeding, ulceration or varices in       the entire examined stomach.      The cardia and gastric fundus were normal on retroflexion.      A large non-bleeding diverticulum was found in the second portion of the       duodenum.      The exam of the duodenum was otherwise normal. Impression:               - No gross lesions in esophagus.                           - Non-bleeding duodenal diverticulum.                           - No specimens collected. Recommendation:           - Return patient to hospital ward for ongoing care.                           - Clear liquid diet.                           -  Continue present medications. Procedure Code(s):        --- Professional ---                           [phone], Esophagogastroduodenoscopy, flexible,                            transoral; diagnostic, including collection of                            specimen(s) by brushing or washing, when performed                            (separate procedure) Diagnosis Code(s):        --- Professional ---                           K92.1, Melena (includes Hematochezia)                           K57.10, Diverticulosis of small intestine without                            perforation or abscess without bleeding CPT copyright 2018 American Medical Association. All rights reserved. The codes documented in this report  are preliminary and upon coder review may  be revised to meet current compliance requirements. Kathi Der, MD Kathi Der, MD 06/12/2018 8:40:49 AM Number of Addenda: 0

## 2018-06-12 NOTE — Consult Note (Signed)
   [facility] Consult   06/12/2018  Darlene SchmidtJanie B Wilson May 19, 1927 [id number]  Patient screened for potential [facility] Care Management services and patient in the [employer] Medicare plan.  Chart review [facility] and currently therapy is recommending a skilled nursing facility for rehab stay. No current [facility] community follow up needs assessed.   Please place a [facility] Care [facility] consult or for questions contact:   Charlesetta ShanksVictoria Aubreyana Saltz, RN BSN CCM [facility] Lia[facility]  [phone] business mobile phone Toll free office [phone]

## 2018-06-12 NOTE — Transfer of Care (Signed)
Immediate Anesthesia Transfer of Care Note  Patient: Darlene Wilson  Procedure(s) Performed: ESOPHAGOGASTRODUODENOSCOPY (EGD) WITH PROPOFOL (N/A )  Patient Location: PACU and Endoscopy Unit  Anesthesia Type:MAC  Level of Consciousness: drowsy  Airway & Oxygen Therapy: Patient Spontanous Breathing and Patient connected to nasal cannula oxygen  Post-op Assessment: Report given to RN and Post -op Vital signs reviewed and stable  Post vital signs: Reviewed and stable  Last Vitals:  Vitals Value Taken Time  BP    Temp    Pulse 66 06/12/2018  8:38 AM  Resp 22 06/12/2018  8:38 AM  SpO2 99 % 06/12/2018  8:38 AM  Vitals shown include unvalidated device data.  Last Pain:  Vitals:   06/12/18 0753  TempSrc: Oral  PainSc: 0-No pain         Complications: No apparent anesthesia complications

## 2018-06-12 NOTE — Progress Notes (Signed)
PROGRESS NOTE        PATIENT DETAILS Name: Darlene Wilson Age: 85 y.o. Sex: female Date of Birth: 1926-11-13 Admit Date: 06/08/2018 Admitting Physician John Giovanni, MD ZOX:[id number], Bryon Lions, MD  Brief Narrative: Patient is a 85 y.o. female with history of dementia, CVA on Aggrenox, hypothyroidism, hypertension-presented from her memory care unit for melanotic stools, found to have acute blood loss anemia.  Admitted to the hospitalist service.  Subjective: Pleasantly confused-no further bleeding overnight.  Assessment/Plan: GI bleeding with acute blood loss anemia: Etiology remains obscure-underwent EGD-did not show any bleeding foci.  Given advanced age-dementia-GI holding off on performing colonoscopy.  If patient remains stable-without any further bleeding-plans are to discharge back to skilled nursing facility tomorrow.  Hemoglobin stable at 10.7-received 2 units of PRBC yesterday.  History of CVA: Pleasantly confused-nonfocal-Aggrenox remains on hold..   Hypertension: Initially hypotensive on presentation-but blood pressure now creeping up-restart amlodipine.    Hypothyroidism: Continue Synthroid  Dementia with mild delirium: Recently confused-continue Zyprexa  DVT Prophylaxis: SCD's  Code Status: DNR  Family Communication: None at bedside  Disposition Plan: Remain inpatient-monitor overnight-if no recurrent GI bleeding-back to memory care unit tomorrow  Antimicrobial agents: Anti-infectives (From admission, onward)   None      Procedures: None  CONSULTS:  GI  Time spent: 25- minutes-Greater than 50% of this time was spent in counseling, explanation of diagnosis, planning of further management, and coordination of care.  MEDICATIONS: Scheduled Meds: . betaxolol  1 drop Both Eyes BID  . donepezil  10 mg Oral QHS  . gabapentin  100 mg Oral QHS  . latanoprost  1 drop Both Eyes QHS  . levothyroxine  37.5 mcg Oral Daily  .  OLANZapine  2.5 mg Intramuscular QHS  . pantoprazole (PROTONIX) IV  40 mg Intravenous Q12H   Continuous Infusions: . sodium chloride Stopped (06/11/18 0902)   PRN Meds:.acetaminophen, ipratropium-albuterol   PHYSICAL EXAM: Vital signs: Vitals:   06/12/18 0840 06/12/18 0845 06/12/18 0850 06/12/18 0855  BP:  (!) 84/48 (!) 98/41 (!) 112/52  Pulse: 68 76 73 71  Resp: (!) 27 20 (!) 24 (!) 25  Temp:      TempSrc:      SpO2: 100% 99% 98% 97%  Weight:      Height:       Filed Weights   06/09/18 1055 06/10/18 0300  Weight: 62.9 kg 58.5 kg   Body mass index is 21.59 kg/m.   General appearance:Awake, pleasantly confused Eyes:no scleral icterus. HEENT: Atraumatic and Normocephalic Neck: supple, no JVD. Resp:Good air entry bilaterally,no rales or rhonchi CVS: S1 S2 regular, no murmurs.  GI: Bowel sounds present, Non tender and not distended with no gaurding, rigidity or rebound. Extremities: B/L Lower Ext shows no edema, both legs are warm to touch Neurology:  Non focal Musculoskeletal:No digital cyanosis Skin:No Rash, warm and dry Wounds:N/A  I have personally reviewed following labs and imaging studies  LABORATORY DATA: CBC: Recent Labs  Lab 06/10/18 0502 06/11/18 0005 06/11/18 0425 06/11/18 1923 06/12/18 0358  WBC 4.4 5.7 4.7 5.1 4.1  NEUTROABS  --  3.1  --   --   --   HGB 9.5* 7.3* 7.0* 11.2* 10.7*  HCT 29.4* 23.2* 21.7* 34.6* 33.3*  MCV 100.7* 102.2* 102.4* 91.5 92.5  PLT 422* 329 308 250 227    Basic Metabolic Panel:  Recent Labs  Lab 06/08/18 2010 06/08/18 2031 06/09/18 0330 06/10/18 0502  NA 136 137 136 140  K 4.6 4.6 4.0 3.0*  CL 107 107 112* 110  CO2 22  --  20* 24  GLUCOSE 160* 160* 93 87  BUN 20 20 22  7*  CREATININE 0.96 0.90 0.82 0.82  CALCIUM 8.6*  --  7.6* 8.2*    GFR: Estimated Creatinine Clearance: 41.5 mL/min (by C-G formula based on SCr of 0.82 mg/dL).  Liver Function Tests: Recent Labs  Lab 06/08/18 2010  AST 18  ALT 14    ALKPHOS 52  BILITOT 0.4  PROT 5.7*  ALBUMIN 2.4*   No results for input(s): LIPASE, AMYLASE in the last 168 hours. No results for input(s): AMMONIA in the last 168 hours.  Coagulation Profile: No results for input(s): INR, PROTIME in the last 168 hours.  Cardiac Enzymes: No results for input(s): CKTOTAL, CKMB, CKMBINDEX, TROPONINI in the last 168 hours.  BNP (last 3 results) No results for input(s): PROBNP in the last 8760 hours.  HbA1C: No results for input(s): HGBA1C in the last 72 hours.  CBG: No results for input(s): GLUCAP in the last 168 hours.  Lipid Profile: No results for input(s): CHOL, HDL, LDLCALC, TRIG, CHOLHDL, LDLDIRECT in the last 72 hours.  Thyroid Function Tests: No results for input(s): TSH, T4TOTAL, FREET4, T3FREE, THYROIDAB in the last 72 hours.  Anemia Panel: No results for input(s): VITAMINB12, FOLATE, FERRITIN, TIBC, IRON, RETICCTPCT in the last 72 hours.  Urine analysis:    Component Value Date/Time   COLORURINE YELLOW 05/24/2018 1702   APPEARANCEUR CLOUDY (A) 05/24/2018 1702   LABSPEC 1.016 05/24/2018 1702   PHURINE 7.0 05/24/2018 1702   GLUCOSEU NEGATIVE 05/24/2018 1702   HGBUR NEGATIVE 05/24/2018 1702   BILIRUBINUR NEGATIVE 05/24/2018 1702   BILIRUBINUR neg 05/31/2012 1112   KETONESUR 5 (A) 05/24/2018 1702   PROTEINUR NEGATIVE 05/24/2018 1702   UROBILINOGEN 1.0 03/08/2015 1526   NITRITE NEGATIVE 05/24/2018 1702   LEUKOCYTESUR NEGATIVE 05/24/2018 1702    Sepsis Labs: Lactic Acid, Venous    Component Value Date/Time   LATICACIDVEN 1.14 05/24/2018 1542    MICROBIOLOGY: Recent Results (from the past 240 hour(s))  MRSA PCR Screening     Status: None   Collection Time: 06/09/18 12:49 AM  Result Value Ref Range Status   MRSA by PCR NEGATIVE NEGATIVE Final    Comment:        The GeneXpert MRSA Assay (FDA approved for NASAL specimens only), is one component of a comprehensive MRSA colonization surveillance program. It is  not intended to diagnose MRSA infection nor to guide or monitor treatment for MRSA infections. Performed at [facility], [address] [place], [place] [address]     RADIOLOGY STUDIES/RESULTS: Dg Chest 2 View  Result Date: 05/24/2018 CLINICAL DATA:  Dyspnea with increasing lethargy and fever for a few days. EXAM: CHEST - 2 VIEW COMPARISON:  None. FINDINGS: Normal heart size and mediastinal contours with aortic atherosclerosis. Slight uncoiling of the thoracic aorta is identified. Patchy pulmonary consolidation in the right upper lobe distribution is noted consistent with pneumonia. Left lung appears clear. No acute osseous abnormality is noted. IMPRESSION: Right upper lobe pneumonia. Followup PA and lateral chest X-ray is recommended in 3-4 weeks following trial of antibiotic therapy to ensure resolution and exclude underlying malignancy. Electronically Signed   By: Tollie Ethavid  Kwon M.D.   On: 05/24/2018 14:53   Ct Angio Chest Pe W Or Wo Contrast  Result Date: 05/24/2018 CLINICAL DATA:  Shortness of breath and lethargy EXAM: CT ANGIOGRAPHY CHEST WITH CONTRAST TECHNIQUE: Multidetector CT imaging of the chest was performed using the standard protocol during bolus administration of intravenous contrast. Multiplanar CT image reconstructions and MIPs were obtained to evaluate the vascular anatomy. Examination is somewhat limited by patient motion artifact. CONTRAST:  100mL ISOVUE-370 IOPAMIDOL (ISOVUE-370) INJECTION 76% COMPARISON:  None. FINDINGS: Cardiovascular: Thoracic aorta demonstrates diffuse atherosclerotic calcifications without aneurysmal dilatation or dissection. No cardiac enlargement is seen. Mild coronary calcifications are noted. The pulmonary artery is well visualized centrally without evidence pulmonary emboli. The peripheral branches are somewhat bolus as well as patient motion artifact during the exam. Mediastinum/Nodes: The esophagus is within normal limits. No hilar or  mediastinal adenopathy is seen. The thoracic inlet is unremarkable. Lungs/Pleura: Left lung is well aerated without focal infiltrate or sizable effusion. The right lung demonstrates multifocal infiltrate consistent with acute pneumonia particularly within the right upper lobe but to a lesser degree in the right lower lobe. Small right effusion is seen. Upper Abdomen: Visualized upper abdomen is within normal limits. Musculoskeletal: Degenerative changes of the thoracic spine noted. Review of the MIP images confirms the above findings. IMPRESSION: No definitive pulmonary emboli are seen although peripheral branches are somewhat limited due to patient motion artifact. Patchy multifocal infiltrates involving the right upper and right lower lobes. Small right pleural effusion. Aortic Atherosclerosis (ICD10-I70.0). Electronically Signed   By: Alcide CleverMark  Lukens M.D.   On: 05/24/2018 21:47   Dg Swallowing Func-speech Pathology  Result Date: 05/28/2018 Objective Swallowing Evaluation: Type of Study: MBS-Modified Barium Swallow Study  Patient Details Name: Frederik SchmidtJanie B Cage MRN: [id number] Date of Birth: 1926/09/03 Today's Date: 05/28/2018 Time: SLP Start Time (ACUTE ONLY): 1235 -SLP Stop Time (ACUTE ONLY): 1255 SLP Time Calculation (min) (ACUTE ONLY): 20 min Past Medical History: Past Medical History: Diagnosis Date . Aortic atherosclerosis (HCC) 05/26/2018 . Chronic kidney disease 1998  stones . Depression with anxiety 09/14/2014 . Dyspnea  . Fibrocystic breast 01/12/2011 . Glaucoma   both eyes . History of chicken pox  . Hypertension  . Insomnia 01/13/2011 . Kidney stone   stones  . Memory loss 10/12/2012 . Nausea 05/31/2012 . Spinal stenosis 06/14/2011 . Stroke (HCC) 04/2007  x 3 total . Thyroid disease   hypothyroid . Urinary frequency 02/09/2011 . Vertigo 02/09/2011 Past Surgical History: Past Surgical History: Procedure Laterality Date . ABDOMINAL HYSTERECTOMY  1963 . BACK SURGERY  1989  discectomy L4-5 1989, good results . BREAST  SURGERY  1970  biopsy, benign, b/l HPI: 85 y.o. female with Past medical history of CVA, dementia, hypothyroidism, depression, HTN. Admitted with pna  Subjective: alert Assessment / Plan / Recommendation CHL IP CLINICAL IMPRESSIONS 05/28/2018 Clinical Impression Pt presents with functional oropharyngeal swallow marked by adequate mastication, reliable laryngeal vestibule closure with no penetration/aspiration; no residue post-swallow with normal pharyngeal squeeze.  Material passed through UES with no resistance.  No indication of a dysphagia-related aspiration pna.  Continue regular diet, thin liquids; no SLP f/u warranted.  SLP Visit Diagnosis Dysphagia, unspecified (R13.10) Attention and concentration deficit following -- Frontal lobe and executive function deficit following -- Impact on safety and function No limitations   CHL IP TREATMENT RECOMMENDATION 05/28/2018 Treatment Recommendations No treatment recommended at this time   Prognosis 05/25/2018 Prognosis for Safe Diet Advancement Good Barriers to Reach Goals Cognitive deficits Barriers/Prognosis Comment -- CHL IP DIET RECOMMENDATION 05/28/2018 SLP Diet Recommendations Regular solids;Thin liquid Liquid Administration via Straw;Cup Medication Administration Whole meds  with liquid Compensations -- Postural Changes --   CHL IP OTHER RECOMMENDATIONS 05/28/2018 Recommended Consults -- Oral Care Recommendations Oral care BID Other Recommendations --   CHL IP FOLLOW UP RECOMMENDATIONS 05/28/2018 Follow up Recommendations None   CHL IP FREQUENCY AND DURATION 05/25/2018 Speech Therapy Frequency (ACUTE ONLY) min 1 x/week Treatment Duration 1 week      CHL IP ORAL PHASE 05/28/2018 Oral Phase WFL Oral - Pudding Teaspoon -- Oral - Pudding Cup -- Oral - Honey Teaspoon -- Oral - Honey Cup -- Oral - Nectar Teaspoon -- Oral - Nectar Cup -- Oral - Nectar Straw -- Oral - Thin Teaspoon -- Oral - Thin Cup -- Oral - Thin Straw -- Oral - Puree -- Oral - Mech Soft -- Oral -  Regular -- Oral - Multi-Consistency -- Oral - Pill -- Oral Phase - Comment --  CHL IP PHARYNGEAL PHASE 05/28/2018 Pharyngeal Phase WFL Pharyngeal- Pudding Teaspoon -- Pharyngeal -- Pharyngeal- Pudding Cup -- Pharyngeal -- Pharyngeal- Honey Teaspoon -- Pharyngeal -- Pharyngeal- Honey Cup -- Pharyngeal -- Pharyngeal- Nectar Teaspoon -- Pharyngeal -- Pharyngeal- Nectar Cup -- Pharyngeal -- Pharyngeal- Nectar Straw -- Pharyngeal -- Pharyngeal- Thin Teaspoon -- Pharyngeal -- Pharyngeal- Thin Cup -- Pharyngeal -- Pharyngeal- Thin Straw -- Pharyngeal -- Pharyngeal- Puree -- Pharyngeal -- Pharyngeal- Mechanical Soft -- Pharyngeal -- Pharyngeal- Regular -- Pharyngeal -- Pharyngeal- Multi-consistency -- Pharyngeal -- Pharyngeal- Pill -- Pharyngeal -- Pharyngeal Comment --  CHL IP CERVICAL ESOPHAGEAL PHASE 05/28/2018 Cervical Esophageal Phase WFL Pudding Teaspoon -- Pudding Cup -- Honey Teaspoon -- Honey Cup -- Nectar Teaspoon -- Nectar Cup -- Nectar Straw -- Thin Teaspoon -- Thin Cup -- Thin Straw -- Puree -- Mechanical Soft -- Regular -- Multi-consistency -- Pill -- Cervical Esophageal Comment -- Blenda Mounts Laurice 05/28/2018, 12:57 PM                LOS: 1 day   Jeoffrey Massed, MD  [facility]  If 7PM-7AM, please contact night-coverage  Please page via [email or web address] [email or web address]-click on MD name and type text message  06/12/2018, 11:56 AM

## 2018-06-13 ENCOUNTER — Encounter (HOSPITAL_COMMUNITY): Payer: Self-pay | Admitting: Gastroenterology

## 2018-06-13 LAB — CBC
HCT: 29.9 % — ABNORMAL LOW (ref 36.0–46.0)
Hemoglobin: 9.5 g/dL — ABNORMAL LOW (ref 12.0–15.0)
MCH: 30.1 pg (ref 26.0–34.0)
MCHC: 31.8 g/dL (ref 30.0–36.0)
MCV: 94.6 fL (ref 80.0–100.0)
Platelets: 194 10*3/uL (ref 150–400)
RBC: 3.16 MIL/uL — ABNORMAL LOW (ref 3.87–5.11)
RDW: 18.4 % — ABNORMAL HIGH (ref 11.5–15.5)
WBC: 4.2 10*3/uL (ref 4.0–10.5)
nRBC: 0 % (ref 0.0–0.2)

## 2018-06-13 NOTE — Progress Notes (Signed)
PROGRESS NOTE        PATIENT DETAILS Name: Darlene Wilson Age: 85 y.o. Sex: female Date of Birth: 06/29/1926 Admit Date: 06/08/2018 Admitting Physician John Giovanni, MD [id number], Bryon Lions, MD  Brief Narrative: Patient is a 85 y.o. female with history of dementia, CVA on Aggrenox, hypothyroidism, hypertension-presented from her memory care unit for melanotic stools, found to have acute blood loss anemia.  Admitted to the hospitalist service.  Subjective: Mains pleasantly confused-per RN-no bleeding overnight.  Assessment/Plan: GI bleeding with acute blood loss anemia: Etiology remains obscure-underwent EGD on 1/14-it did not show any bleeding foci-could be diverticular bleeding.  Given advanced age, and dementia-holding off on performing a colonoscopy at family's request.  Thankfully bleeding seems to have resolved-hemoglobin stable at 9.5.  Has received 2 units of PRBC so far.  Awaiting SNF bed  History of CVA: Pleasantly confused-nonfocal-Aggrenox remains on hold..   Hypertension: Controlled-continue amlodipine.   Hypothyroidism: Continue Synthroid  Dementia with mild delirium: Recently confused-continue Zyprexa  DVT Prophylaxis: SCD's  Code Status: DNR  Family Communication: None at bedside  Disposition Plan: Remain inpatient-hopefully SNF on 1/16  Antimicrobial agents: Anti-infectives (From admission, onward)   None      Procedures: None  CONSULTS:  GI  Time spent: 25- minutes-Greater than 50% of this time was spent in counseling, explanation of diagnosis, planning of further management, and coordination of care.  MEDICATIONS: Scheduled Meds: . amLODipine  5 mg Oral Daily  . betaxolol  1 drop Both Eyes BID  . donepezil  10 mg Oral QHS  . gabapentin  100 mg Oral QHS  . latanoprost  1 drop Both Eyes QHS  . levothyroxine  37.5 mcg Oral Daily  . OLANZapine  2.5 mg Intramuscular QHS  . pantoprazole (PROTONIX) IV  40 mg  Intravenous Q12H   Continuous Infusions: . sodium chloride Stopped (06/11/18 0902)   PRN Meds:.acetaminophen, ipratropium-albuterol   PHYSICAL EXAM: Vital signs: Vitals:   06/12/18 1353 06/12/18 1648 06/12/18 1952 06/13/18 0415  BP: 135/63 (!) 119/59 (!) 110/57 (!) 120/58  Pulse: 71 79 74 69  Resp: 20 (!) 23 19 19   Temp: 98.2 F (36.8 C) 98 F (36.7 C) 98 F (36.7 C) 97.6 F (36.4 C)  TempSrc: Oral Oral Oral Oral  SpO2: 97% 97% 99% 94%  Weight:      Height:       Filed Weights   06/09/18 1055 06/10/18 0300  Weight: 62.9 kg 58.5 kg   Body mass index is 21.59 kg/m.   General appearance:Awake, confused-not in any distress. Eyes:no scleral icterus. HEENT: Atraumatic and Normocephalic Neck: supple, no JVD. Resp:Good air entry bilaterally, clear to auscultation CVS: S1 S2 regular, no murmurs.  GI: Bowel sounds present, Non tender and not distended with no gaurding, rigidity or rebound. Extremities: B/L Lower Ext shows no edema, both legs are warm to touch Neurology: Moves all 4 extremities  Musculoskeletal:No digital cyanosis Skin:No Rash, warm and dry Wounds:N/A  I have personally reviewed following labs and imaging studies  LABORATORY DATA: CBC: Recent Labs  Lab 06/11/18 0005 06/11/18 0425 06/11/18 1923 06/12/18 0358 06/13/18 0409  WBC 5.7 4.7 5.1 4.1 4.2  NEUTROABS 3.1  --   --   --   --   HGB 7.3* 7.0* 11.2* 10.7* 9.5*  HCT 23.2* 21.7* 34.6* 33.3* 29.9*  MCV 102.2* 102.4* 91.5 92.5  94.6  PLT 329 308 250 227 194    Basic Metabolic Panel: Recent Labs  Lab 06/08/18 2010 06/08/18 2031 06/09/18 0330 06/10/18 0502  NA 136 137 136 140  K 4.6 4.6 4.0 3.0*  CL 107 107 112* 110  CO2 22  --  20* 24  GLUCOSE 160* 160* 93 87  BUN 20 20 22  7*  CREATININE 0.96 0.90 0.82 0.82  CALCIUM 8.6*  --  7.6* 8.2*    GFR: Estimated Creatinine Clearance: 41.5 mL/min (by C-G formula based on SCr of 0.82 mg/dL).  Liver Function Tests: Recent Labs  Lab  06/08/18 2010  AST 18  ALT 14  ALKPHOS 52  BILITOT 0.4  PROT 5.7*  ALBUMIN 2.4*   No results for input(s): LIPASE, AMYLASE in the last 168 hours. No results for input(s): AMMONIA in the last 168 hours.  Coagulation Profile: No results for input(s): INR, PROTIME in the last 168 hours.  Cardiac Enzymes: No results for input(s): CKTOTAL, CKMB, CKMBINDEX, TROPONINI in the last 168 hours.  BNP (last 3 results) No results for input(s): PROBNP in the last 8760 hours.  HbA1C: No results for input(s): HGBA1C in the last 72 hours.  CBG: No results for input(s): GLUCAP in the last 168 hours.  Lipid Profile: No results for input(s): CHOL, HDL, LDLCALC, TRIG, CHOLHDL, LDLDIRECT in the last 72 hours.  Thyroid Function Tests: No results for input(s): TSH, T4TOTAL, FREET4, T3FREE, THYROIDAB in the last 72 hours.  Anemia Panel: No results for input(s): VITAMINB12, FOLATE, FERRITIN, TIBC, IRON, RETICCTPCT in the last 72 hours.  Urine analysis:    Component Value Date/Time   COLORURINE YELLOW 05/24/2018 1702   APPEARANCEUR CLOUDY (A) 05/24/2018 1702   LABSPEC 1.016 05/24/2018 1702   PHURINE 7.0 05/24/2018 1702   GLUCOSEU NEGATIVE 05/24/2018 1702   HGBUR NEGATIVE 05/24/2018 1702   BILIRUBINUR NEGATIVE 05/24/2018 1702   BILIRUBINUR neg 05/31/2012 1112   KETONESUR 5 (A) 05/24/2018 1702   PROTEINUR NEGATIVE 05/24/2018 1702   UROBILINOGEN 1.0 03/08/2015 1526   NITRITE NEGATIVE 05/24/2018 1702   LEUKOCYTESUR NEGATIVE 05/24/2018 1702    Sepsis Labs: Lactic Acid, Venous    Component Value Date/Time   LATICACIDVEN 1.14 05/24/2018 1542    MICROBIOLOGY: Recent Results (from the past 240 hour(s))  MRSA PCR Screening     Status: None   Collection Time: 06/09/18 12:49 AM  Result Value Ref Range Status   MRSA by PCR NEGATIVE NEGATIVE Final    Comment:        The GeneXpert MRSA Assay (FDA approved for NASAL specimens only), is one component of a comprehensive MRSA  colonization surveillance program. It is not intended to diagnose MRSA infection nor to guide or monitor treatment for MRSA infections. Performed at [facility], [address] [place], [place] [address]     RADIOLOGY STUDIES/RESULTS: Dg Chest 2 View  Result Date: 05/24/2018 CLINICAL DATA:  Dyspnea with increasing lethargy and fever for a few days. EXAM: CHEST - 2 VIEW COMPARISON:  None. FINDINGS: Normal heart size and mediastinal contours with aortic atherosclerosis. Slight uncoiling of the thoracic aorta is identified. Patchy pulmonary consolidation in the right upper lobe distribution is noted consistent with pneumonia. Left lung appears clear. No acute osseous abnormality is noted. IMPRESSION: Right upper lobe pneumonia. Followup PA and lateral chest X-ray is recommended in 3-4 weeks following trial of antibiotic therapy to ensure resolution and exclude underlying malignancy. Electronically Signed   By: Rene Kocher.D.  On: 05/24/2018 14:53   Ct Angio Chest Pe W Or Wo Contrast  Result Date: 05/24/2018 CLINICAL DATA:  Shortness of breath and lethargy EXAM: CT ANGIOGRAPHY CHEST WITH CONTRAST TECHNIQUE: Multidetector CT imaging of the chest was performed using the standard protocol during bolus administration of intravenous contrast. Multiplanar CT image reconstructions and MIPs were obtained to evaluate the vascular anatomy. Examination is somewhat limited by patient motion artifact. CONTRAST:  100mL ISOVUE-370 IOPAMIDOL (ISOVUE-370) INJECTION 76% COMPARISON:  None. FINDINGS: Cardiovascular: Thoracic aorta demonstrates diffuse atherosclerotic calcifications without aneurysmal dilatation or dissection. No cardiac enlargement is seen. Mild coronary calcifications are noted. The pulmonary artery is well visualized centrally without evidence pulmonary emboli. The peripheral branches are somewhat bolus as well as patient motion artifact during the exam. Mediastinum/Nodes: The esophagus is  within normal limits. No hilar or mediastinal adenopathy is seen. The thoracic inlet is unremarkable. Lungs/Pleura: Left lung is well aerated without focal infiltrate or sizable effusion. The right lung demonstrates multifocal infiltrate consistent with acute pneumonia particularly within the right upper lobe but to a lesser degree in the right lower lobe. Small right effusion is seen. Upper Abdomen: Visualized upper abdomen is within normal limits. Musculoskeletal: Degenerative changes of the thoracic spine noted. Review of the MIP images confirms the above findings. IMPRESSION: No definitive pulmonary emboli are seen although peripheral branches are somewhat limited due to patient motion artifact. Patchy multifocal infiltrates involving the right upper and right lower lobes. Small right pleural effusion. Aortic Atherosclerosis (ICD10-I70.0). Electronically Signed   By: Alcide CleverMark  Lukens M.D.   On: 05/24/2018 21:47   Dg Swallowing Func-speech Pathology  Result Date: 05/28/2018 Objective Swallowing Evaluation: Type of Study: MBS-Modified Barium Swallow Study  Patient Details Name: Frederik SchmidtJanie B Panzer MRN: [id number] Date of Birth: 08-25-1926 Today's Date: 05/28/2018 Time: SLP Start Time (ACUTE ONLY): 1235 -SLP Stop Time (ACUTE ONLY): 1255 SLP Time Calculation (min) (ACUTE ONLY): 20 min Past Medical History: Past Medical History: Diagnosis Date . Aortic atherosclerosis (HCC) 05/26/2018 . Chronic kidney disease 1998  stones . Depression with anxiety 09/14/2014 . Dyspnea  . Fibrocystic breast 01/12/2011 . Glaucoma   both eyes . History of chicken pox  . Hypertension  . Insomnia 01/13/2011 . Kidney stone   stones  . Memory loss 10/12/2012 . Nausea 05/31/2012 . Spinal stenosis 06/14/2011 . Stroke (HCC) 04/2007  x 3 total . Thyroid disease   hypothyroid . Urinary frequency 02/09/2011 . Vertigo 02/09/2011 Past Surgical History: Past Surgical History: Procedure Laterality Date . ABDOMINAL HYSTERECTOMY  1963 . BACK SURGERY  1989   discectomy L4-5 1989, good results . BREAST SURGERY  1970  biopsy, benign, b/l HPI: 85 y.o. female with Past medical history of CVA, dementia, hypothyroidism, depression, HTN. Admitted with pna  Subjective: alert Assessment / Plan / Recommendation CHL IP CLINICAL IMPRESSIONS 05/28/2018 Clinical Impression Pt presents with functional oropharyngeal swallow marked by adequate mastication, reliable laryngeal vestibule closure with no penetration/aspiration; no residue post-swallow with normal pharyngeal squeeze.  Material passed through UES with no resistance.  No indication of a dysphagia-related aspiration pna.  Continue regular diet, thin liquids; no SLP f/u warranted.  SLP Visit Diagnosis Dysphagia, unspecified (R13.10) Attention and concentration deficit following -- Frontal lobe and executive function deficit following -- Impact on safety and function No limitations   CHL IP TREATMENT RECOMMENDATION 05/28/2018 Treatment Recommendations No treatment recommended at this time   Prognosis 05/25/2018 Prognosis for Safe Diet Advancement Good Barriers to Reach Goals Cognitive deficits Barriers/Prognosis Comment -- CHL IP DIET RECOMMENDATION 05/28/2018  SLP Diet Recommendations Regular solids;Thin liquid Liquid Administration via Straw;Cup Medication Administration Whole meds with liquid Compensations -- Postural Changes --   CHL IP OTHER RECOMMENDATIONS 05/28/2018 Recommended Consults -- Oral Care Recommendations Oral care BID Other Recommendations --   CHL IP FOLLOW UP RECOMMENDATIONS 05/28/2018 Follow up Recommendations None   CHL IP FREQUENCY AND DURATION 05/25/2018 Speech Therapy Frequency (ACUTE ONLY) min 1 x/week Treatment Duration 1 week      CHL IP ORAL PHASE 05/28/2018 Oral Phase WFL Oral - Pudding Teaspoon -- Oral - Pudding Cup -- Oral - Honey Teaspoon -- Oral - Honey Cup -- Oral - Nectar Teaspoon -- Oral - Nectar Cup -- Oral - Nectar Straw -- Oral - Thin Teaspoon -- Oral - Thin Cup -- Oral - Thin Straw -- Oral  - Puree -- Oral - Mech Soft -- Oral - Regular -- Oral - Multi-Consistency -- Oral - Pill -- Oral Phase - Comment --  CHL IP PHARYNGEAL PHASE 05/28/2018 Pharyngeal Phase WFL Pharyngeal- Pudding Teaspoon -- Pharyngeal -- Pharyngeal- Pudding Cup -- Pharyngeal -- Pharyngeal- Honey Teaspoon -- Pharyngeal -- Pharyngeal- Honey Cup -- Pharyngeal -- Pharyngeal- Nectar Teaspoon -- Pharyngeal -- Pharyngeal- Nectar Cup -- Pharyngeal -- Pharyngeal- Nectar Straw -- Pharyngeal -- Pharyngeal- Thin Teaspoon -- Pharyngeal -- Pharyngeal- Thin Cup -- Pharyngeal -- Pharyngeal- Thin Straw -- Pharyngeal -- Pharyngeal- Puree -- Pharyngeal -- Pharyngeal- Mechanical Soft -- Pharyngeal -- Pharyngeal- Regular -- Pharyngeal -- Pharyngeal- Multi-consistency -- Pharyngeal -- Pharyngeal- Pill -- Pharyngeal -- Pharyngeal Comment --  CHL IP CERVICAL ESOPHAGEAL PHASE 05/28/2018 Cervical Esophageal Phase WFL Pudding Teaspoon -- Pudding Cup -- Honey Teaspoon -- Honey Cup -- Nectar Teaspoon -- Nectar Cup -- Nectar Straw -- Thin Teaspoon -- Thin Cup -- Thin Straw -- Puree -- Mechanical Soft -- Regular -- Multi-consistency -- Pill -- Cervical Esophageal Comment -- Blenda MountsCouture, Amanda Laurice 05/28/2018, 12:57 PM                LOS: 2 days   Jeoffrey MassedShanker Ghimire, MD  [facility]  If 7PM-7AM, please contact night-coverage  Please page via [email or web address]-Password [email or web address]-click on MD name and type text message  06/13/2018, 10:43 AM

## 2018-06-13 NOTE — Progress Notes (Signed)
[facility] Progress Note  ZENITH DISS 85 y.o. 1926-10-24  CC: GI bleed   Subjective: No further bleeding episodes.  EGD yesterday negative for active bleeding.  ROS : Negative for chest pain.  Positive for weakness and fatigue.   Objective: Vital signs in last 24 hours: Vitals:   06/12/18 1952 06/13/18 0415  BP: (!) 110/57 (!) 120/58  Pulse: 74 69  Resp: 19 19  Temp: 98 F (36.7 C) 97.6 F (36.4 C)  SpO2: 99% 94%    Physical Exam:  General.  Somewhat lethargic.  Alert and oriented X 1 Heart.  Rate rhythm regular. Abdomen.  Soft, nontender, nondistended, bowel sounds present.  Lab Results: No results for input(s): NA, K, CL, CO2, GLUCOSE, BUN, CREATININE, CALCIUM, MG, PHOS in the last 72 hours. No results for input(s): AST, ALT, ALKPHOS, BILITOT, PROT, ALBUMIN in the last 72 hours. Recent Labs    06/11/18 0005  06/12/18 0358 06/13/18 0409  WBC 5.7   < > 4.1 4.2  NEUTROABS 3.1  --   --   --   HGB 7.3*   < > 10.7* 9.5*  HCT 23.2*   < > 33.3* 29.9*  MCV 102.2*   < > 92.5 94.6  PLT 329   < > 227 194   < > = values in this interval not displayed.   No results for input(s): LABPROT, INR in the last 72 hours.    Assessment/Plan: -Melena -GI bleed. ??  Upper GI bleed versus blood clots from lower GI bleed. -Acute blood loss anemia  Recommendations ------------------------- -Hemoglobin relatively stable.  No further bleeding episodes.  No further inpatient GI work-up planned.  GI will sign off.  Call us back if needed.  Advance diet to soft.   Kathi Der MD, FACP 06/13/2018, 8:33 AM  Contact #  [phone]

## 2018-06-13 NOTE — Progress Notes (Signed)
CSW spoke with patient's daughter-in-law Kriste Basque) to request she do paperwork today at [facility] in preparation for discharge tomorrow. [facility] notes that patient's DOB on Medicare is [birth year].  Osborne Casco Chrystal Zeimet LCSW [phone]

## 2018-06-14 LAB — CBC
HCT: 30.1 % — ABNORMAL LOW (ref 36.0–46.0)
Hemoglobin: 9.9 g/dL — ABNORMAL LOW (ref 12.0–15.0)
MCH: 31.1 pg (ref 26.0–34.0)
MCHC: 32.9 g/dL (ref 30.0–36.0)
MCV: 94.7 fL (ref 80.0–100.0)
Platelets: 219 10*3/uL (ref 150–400)
RBC: 3.18 MIL/uL — AB (ref 3.87–5.11)
RDW: 17.6 % — ABNORMAL HIGH (ref 11.5–15.5)
WBC: 5 10*3/uL (ref 4.0–10.5)
nRBC: 0 % (ref 0.0–0.2)

## 2018-06-14 MED ORDER — PANTOPRAZOLE SODIUM 40 MG PO TBEC: 40.0000 mg | DELAYED_RELEASE_TABLET | Freq: Every day | ORAL | 0 refills | Status: AC

## 2018-06-14 NOTE — Progress Notes (Signed)
Physical Therapy Treatment Patient Details Name: Darlene Wilson MRN: [id number] DOB: Apr 16, 1927 Today's Date: 06/14/2018    History of Present Illness Pt is an 85 y/o female admitted from a facility (memory care) secondary to hematochezia and found to have a GIB and hypotension. PMH including but not limited to dementia, CKD, spinal stenosis and vertigo.    PT Comments    Pt performed gait training and functional mobility during session this am.  Pt is able to follow commands to correct technique during session but lacks memory to maintain corrections long term.  Pt required decreased assistance.  She continues to benefit from short term rehab to continue to improve strength before returning to her memory care unit.    Follow Up Recommendations  SNF     Equipment Recommendations  None recommended by PT    Recommendations for Other Services       Precautions / Restrictions Precautions Precautions: Fall Precaution Comments: incontinence Restrictions Weight Bearing Restrictions: No    Mobility  Bed Mobility Overal bed mobility: Needs Assistance Bed Mobility: Rolling;Sidelying to Sit Rolling: Supervision Sidelying to sit: Supervision       General bed mobility comments: Pt able to achieve sitting to edge of bed un assisted, she is able to follow commands to scoot forward to edge of bed to prepare for standing.    Transfers Overall transfer level: Needs assistance Equipment used: Rolling walker (2 wheeled) Transfers: Sit to/from Stand Sit to Stand: Min assist         General transfer comment: Cues for hand placement to push from seated surface.  Pt continues to attempt to reach for  RW to achieve standing.    Ambulation/Gait Ambulation/Gait assistance: Min assist;+2 safety/equipment Gait Distance (Feet): 70 Feet(Fatigues with gt training.  ) Assistive device: Rolling walker (2 wheeled) Gait Pattern/deviations: Step-through pattern;Decreased stride length;Trunk  flexed;Shuffle     General Gait Details: Cues for upper trunk control and to step closer to device.  Pt required several standing rest breaks to correct posture during mobility.     Stairs             Wheelchair Mobility    Modified Rankin (Stroke Patients Only)       Balance Overall balance assessment: Needs assistance   Sitting balance-Leahy Scale: Fair       Standing balance-Leahy Scale: Poor Standing balance comment: needs external assist                            Cognition Arousal/Alertness: Awake/alert Behavior During Therapy: Flat affect Overall Cognitive Status: (Pt is from a memory care unit and has history of dementia.  )                                 General Comments: Pt has repetitive moan and not really engaging in conversation.  She is only oriented to her self.        Exercises      General Comments        Pertinent Vitals/Pain Pain Assessment: No/denies pain    Home Living                      Prior Function            PT Goals (current goals can now be found in the care plan section) Acute Rehab PT Goals Patient  Stated Goal: unable to state Potential to Achieve Goals: Fair Progress towards PT goals: Progressing toward goals    Frequency    Min 2X/week      PT Plan Current plan remains appropriate    Co-evaluation              AM-PAC PT "6 Clicks" Mobility   Outcome Measure  Help needed turning from your back to your side while in a flat bed without using bedrails?: A Little Help needed moving from lying on your back to sitting on the side of a flat bed without using bedrails?: A Little Help needed moving to and from a bed to a chair (including a wheelchair)?: A Little Help needed standing up from a chair using your arms (e.g., wheelchair or bedside chair)?: A Little Help needed to walk in hospital room?: A Little Help needed climbing 3-5 steps with a railing? : A Lot 6 Click  Score: 17    End of Session Equipment Utilized During Treatment: Gait belt Activity Tolerance: Patient limited by fatigue Patient left: in chair;with call bell/phone within reach;with chair alarm set Nurse Communication: Mobility status PT Visit Diagnosis: Other abnormalities of gait and mobility (R26.89);Muscle weakness (generalized) (M62.81)     Time: [id number] PT Time Calculation (min) (ACUTE ONLY): 15 min  Charges:  $Gait Training: 8-22 mins                     Joycelyn RuaAimee Kasha Howeth, PTA Acute Rehabilitation Services Pager [phone] Office [phone]     Jolena Kittle Artis DelayJ Africa Masaki 06/14/2018, 1:15 PM

## 2018-06-14 NOTE — Discharge Summary (Signed)
PATIENT DETAILS Name: Darlene Wilson Age: 85 y.o. Sex: female Date of Birth: [birth year] MRN: [id number]. Admitting Physician: John Giovanni, MD GBT:DVVOH, Bryon Lions, MD  Admit Date: 06/08/2018 Discharge date: 06/14/2018  Recommendations for Outpatient Follow-up:  1. Follow up with PCP in 1-2 weeks 2. Please obtain BMP/CBC in one week 3. Aggrenox on hold-would continue to hold for at least 1 week before resuming given GI bleeding requiring PRBC transfusion  Admitted From:  ALF  Disposition: SNF   Home Health: No  Equipment/Devices: None  Discharge Condition: Stable  CODE STATUS: FULL CODE  Diet recommendation:  Heart Healthy  Brief Summary: See H&P, Labs, Consult and Test reports for all details in brief, Patient is a 85 y.o. female with history of dementia, CVA on Aggrenox, hypothyroidism, hypertension-presented from her memory care unit for melanotic stools, found to have acute blood loss anemia.  Admitted to the hospitalist service.  Brief Hospital Course: GI bleeding with acute blood loss anemia: Etiology remains obscure-underwent EGD on 1/14-it did not show any bleeding foci-could be diverticular bleeding.  Given advanced age, and dementia-holding off on performing a colonoscopy at family's request.  Thankfully bleeding seems to have resolved-hemoglobin stable at 9.9.  Has received 2 units of PRBC so far.  No further recommendations from GI-stable for discharge.  History of CVA: Pleasantly confused-nonfocal-Aggrenox remains on hold..   Hypertension: Controlled-continue amlodipine.   Hypothyroidism: Continue Synthroid  Dementia with mild delirium: Recently confused-continue Zyprexa  Procedures/Studies: 1/14>>EGD:large duodenal diverticulum otherwise no evidence of active bleeding, ulcer or varices.  Discharge Diagnoses:  Principal Problem:   GI bleed Active Problems:   Hypertension   Stroke [facility])   Thyroid disease   CKD (chronic kidney  disease) stage 2, GFR 60-89 ml/min   Discharge Instructions:  Activity:  As tolerated with Full fall precautions use walker/cane & assistance as needed  Discharge Instructions    Diet - low sodium heart healthy   Complete by:  As directed    Discharge instructions   Complete by:  As directed    Follow with Primary MD  [name], MD  In 1 week  Please get a complete blood count and chemistry panel checked by your Primary MD at your next visit, and again as instructed by your Primary MD.  Get Medicines reviewed and adjusted: Please take all your medications with you for your next visit with your Primary MD  Laboratory/radiological data: Please request your Primary MD to go over all hospital tests and procedure/radiological results at the follow up, please ask your Primary MD to get all Hospital records sent to his/her office.  In some cases, they will be blood work, cultures and biopsy results pending at the time of your discharge. Please request that your primary care M.D. follows up on these results.  Also Note the following: If you experience worsening of your admission symptoms, develop shortness of breath, life threatening emergency, suicidal or homicidal thoughts you must seek medical attention immediately by calling 911 or calling your MD immediately  if symptoms less severe.  You must read complete instructions/literature along with all the possible adverse reactions/side effects for all the Medicines you take and that have been prescribed to you. Take any new Medicines after you have completely understood and accpet all the possible adverse reactions/side effects.   Do not drive when taking Pain medications or sleeping medications (Benzodaizepines)  Do not take more than prescribed Pain, Sleep and Anxiety Medications. It is not advisable to combine anxiety,sleep  and pain medications without talking with your primary care practitioner  Special Instructions: If you have  smoked or chewed Tobacco  in the last 2 yrs please stop smoking, stop any regular Alcohol  and or any Recreational drug use.  Wear Seat belts while driving.  Please note: You were cared for by a hospitalist during your hospital stay. Once you are discharged, your primary care physician will handle any further medical issues. Please note that NO REFILLS for any discharge medications will be authorized once you are discharged, as it is imperative that you return to your primary care physician (or establish a relationship with a primary care physician if you do not have one) for your post hospital discharge needs so that they can reassess your need for medications and monitor your lab values.   Increase activity slowly   Complete by:  As directed      Allergies as of 06/14/2018      Reactions   Ciprofloxacin Hives   "Allergic," per [facility]   Combigan [brimonidine Tartrate-timolol] Swelling   Inflammation; "Allergic," per MAR   Detrol [tolterodine Tartrate] Hives   "Allergic," per [facility]   Oxycodone Hives   "Allergic," per [facility]   Penicillins Hives, Itching, Rash   DID THE REACTION INVOLVE: Swelling of the face/tongue/throat, SOB, or low BP? Yes Sudden or severe rash/hives, skin peeling, or the inside of the mouth or nose? Unk Did it require medical treatment? Unk When did it last happen?Unk If all above answers are "NO", may proceed with cephalosporin use.   Sulfa Antibiotics Hives, Itching, Rash   "Allergic," per [facility]      Medication List    STOP taking these medications   dipyridamole-aspirin 200-25 MG 12hr capsule Commonly known as:  AGGRENOX   guaiFENesin 600 MG 12 hr tablet Commonly known as:  MUCINEX   potassium chloride SA 20 MEQ tablet Commonly known as:  K-DUR,KLOR-CON     TAKE these medications   acetaminophen 325 MG tablet Commonly known as:  TYLENOL Take 2 tablets (650 mg total) by mouth every 6 (six) hours as needed. What changed:  reasons to take this   albuterol 108  (90 Base) MCG/ACT inhaler Commonly known as:  PROVENTIL HFA;VENTOLIN HFA Inhale 2 puffs into the lungs every 4 (four) hours as needed for wheezing or shortness of breath.   amLODipine 5 MG tablet Commonly known as:  NORVASC Take 1 tablet (5 mg total) by mouth daily.   betaxolol 0.5 % ophthalmic suspension Commonly known as:  BETOPTIC-S Place 1 drop into both eyes 2 (two) times daily.   citalopram 10 MG tablet Commonly known as:  CELEXA Take 10 mg by mouth daily.   donepezil 10 MG tablet Commonly known as:  ARICEPT Take 1 tablet (10 mg total) by mouth at bedtime.   gabapentin 100 MG capsule Commonly known as:  NEURONTIN TAKE 1 CAPSULE (100 MG TOTAL) BY MOUTH AT BEDTIME. What changed:    how much to take  how to take this  when to take this  additional instructions   ipratropium 0.02 % nebulizer solution Commonly known as:  ATROVENT Take 2.5 mLs (0.5 mg total) by nebulization every 6 (six) hours as needed for wheezing or shortness of breath.   latanoprost 0.005 % ophthalmic solution Commonly known as:  XALATAN Place 1 drop into both eyes at bedtime.   levothyroxine 75 MCG tablet Commonly known as:  SYNTHROID, LEVOTHROID Take 0.5 tablets (37.5 mcg total) by mouth daily. What changed:  when to take this   OLANZapine 2.5 MG tablet Commonly known as:  ZYPREXA Take 1 tablet (2.5 mg total) by mouth every evening.   pantoprazole 40 MG tablet Commonly known as:  PROTONIX Take 1 tablet (40 mg total) by mouth daily.       Contact information for follow-up providers    Bradd CanaryBlyth, Stacey A, MD. Schedule an appointment as soon as possible for a visit in 1 week(s).   Specialty:  Family Medicine Contact information: [address] [place] [id number] [phone]            Contact information for after-discharge care    Destination    [facility] Preferred SNF .   Service:  Skilled Nursing Contact information: [address] [place] [phone] [phone]                 Allergies  Allergen Reactions  . Ciprofloxacin Hives    "Allergic," per [facility]  . Combigan [Brimonidine Tartrate-Timolol] Swelling    Inflammation; "Allergic," per [facility]  . Detrol [Tolterodine Tartrate] Hives    "Allergic," per [facility]  . Oxycodone Hives    "Allergic," per [facility]  . Penicillins Hives, Itching and Rash    DID THE REACTION INVOLVE: Swelling of the face/tongue/throat, SOB, or low BP? Yes Sudden or severe rash/hives, skin peeling, or the inside of the mouth or nose? Unk Did it require medical treatment? Unk When did it last happen?Unk If all above answers are "NO", may proceed with cephalosporin use.  . Sulfa Antibiotics Hives, Itching and Rash    "[facility]    Consultations:   GI  Other Procedures/Studies: Dg Chest 2 View  Result Date: 05/24/2018 CLINICAL DATA:  Dyspnea with increasing lethargy and fever for a few days. EXAM: CHEST - 2 VIEW COMPARISON:  None. FINDINGS: Normal heart size and mediastinal contours with aortic atherosclerosis. Slight uncoiling of the thoracic aorta is identified. Patchy pulmonary consolidation in the right upper lobe distribution is noted consistent with pneumonia. Left lung appears clear. No acute osseous abnormality is noted. IMPRESSION: Right upper lobe pneumonia. Followup PA and lateral chest X-ray is recommended in 3-4 weeks following trial of antibiotic therapy to ensure resolution and exclude underlying malignancy. Electronically Signed   By: Tollie Ethavid  Kwon M.D.   On: 05/24/2018 14:53   Ct Angio Chest Pe W Or Wo Contrast  Result Date: 05/24/2018 CLINICAL DATA:  Shortness of breath and lethargy EXAM: CT ANGIOGRAPHY CHEST WITH CONTRAST TECHNIQUE: Multidetector CT imaging of the chest was performed using the standard protocol during bolus administration of intravenous contrast. Multiplanar CT image reconstructions and MIPs were obtained to evaluate the vascular  anatomy. Examination is somewhat limited by patient motion artifact. CONTRAST:  100mL ISOVUE-370 IOPAMIDOL (ISOVUE-370) INJECTION 76% COMPARISON:  None. FINDINGS: Cardiovascular: Thoracic aorta demonstrates diffuse atherosclerotic calcifications without aneurysmal dilatation or dissection. No cardiac enlargement is seen. Mild coronary calcifications are noted. The pulmonary artery is well visualized centrally without evidence pulmonary emboli. The peripheral branches are somewhat bolus as well as patient motion artifact during the exam. Mediastinum/Nodes: The esophagus is within normal limits. No hilar or mediastinal adenopathy is seen. The thoracic inlet is unremarkable. Lungs/Pleura: Left lung is well aerated without focal infiltrate or sizable effusion. The right lung demonstrates multifocal infiltrate consistent with acute pneumonia particularly within the right upper lobe but to a lesser degree in the right lower lobe. Small right effusion is seen. Upper Abdomen: Visualized upper abdomen is  within normal limits. Musculoskeletal: Degenerative changes of the thoracic spine noted. Review of the MIP images confirms the above findings. IMPRESSION: No definitive pulmonary emboli are seen although peripheral branches are somewhat limited due to patient motion artifact. Patchy multifocal infiltrates involving the right upper and right lower lobes. Small right pleural effusion. Aortic Atherosclerosis (ICD10-I70.0). Electronically Signed   By: Alcide Clever M.D.   On: 05/24/2018 21:47   Dg Swallowing Func-speech Pathology  Result Date: 05/28/2018 Objective Swallowing Evaluation: Type of Study: MBS-Modified Barium Swallow Study  Patient Details Name: MINIE ROADCAP MRN: [id number] Date of Birth: 04/08/1927 Today's Date: 05/28/2018 Time: SLP Start Time (ACUTE ONLY): 1235 -SLP Stop Time (ACUTE ONLY): 1255 SLP Time Calculation (min) (ACUTE ONLY): 20 min Past Medical History: Past Medical History: Diagnosis Date . Aortic  atherosclerosis (HCC) 05/26/2018 . Chronic kidney disease 1998  stones . Depression with anxiety 09/14/2014 . Dyspnea  . Fibrocystic breast 01/12/2011 . Glaucoma   both eyes . History of chicken pox  . Hypertension  . Insomnia 01/13/2011 . Kidney stone   stones  . Memory loss 10/12/2012 . Nausea 05/31/2012 . Spinal stenosis 06/14/2011 . Stroke (HCC) 04/2007  x 3 total . Thyroid disease   hypothyroid . Urinary frequency 02/09/2011 . Vertigo 02/09/2011 Past Surgical History: Past Surgical History: Procedure Laterality Date . ABDOMINAL HYSTERECTOMY  1963 . BACK SURGERY  1989  discectomy L4-5 1989, good results . BREAST SURGERY  1970  biopsy, benign, b/l HPI: 85 y.o. female with Past medical history of CVA, dementia, hypothyroidism, depression, HTN. Admitted with pna  Subjective: alert Assessment / Plan / Recommendation CHL IP CLINICAL IMPRESSIONS 05/28/2018 Clinical Impression Pt presents with functional oropharyngeal swallow marked by adequate mastication, reliable laryngeal vestibule closure with no penetration/aspiration; no residue post-swallow with normal pharyngeal squeeze.  Material passed through UES with no resistance.  No indication of a dysphagia-related aspiration pna.  Continue regular diet, thin liquids; no SLP f/u warranted.  SLP Visit Diagnosis Dysphagia, unspecified (R13.10) Attention and concentration deficit following -- Frontal lobe and executive function deficit following -- Impact on safety and function No limitations   CHL IP TREATMENT RECOMMENDATION 05/28/2018 Treatment Recommendations No treatment recommended at this time   Prognosis 05/25/2018 Prognosis for Safe Diet Advancement Good Barriers to Reach Goals Cognitive deficits Barriers/Prognosis Comment -- CHL IP DIET RECOMMENDATION 05/28/2018 SLP Diet Recommendations Regular solids;Thin liquid Liquid Administration via Straw;Cup Medication Administration Whole meds with liquid Compensations -- Postural Changes --   CHL IP OTHER RECOMMENDATIONS  05/28/2018 Recommended Consults -- Oral Care Recommendations Oral care BID Other Recommendations --   CHL IP FOLLOW UP RECOMMENDATIONS 05/28/2018 Follow up Recommendations None   CHL IP FREQUENCY AND DURATION 05/25/2018 Speech Therapy Frequency (ACUTE ONLY) min 1 x/week Treatment Duration 1 week      CHL IP ORAL PHASE 05/28/2018 Oral Phase WFL Oral - Pudding Teaspoon -- Oral - Pudding Cup -- Oral - Honey Teaspoon -- Oral - Honey Cup -- Oral - Nectar Teaspoon -- Oral - Nectar Cup -- Oral - Nectar Straw -- Oral - Thin Teaspoon -- Oral - Thin Cup -- Oral - Thin Straw -- Oral - Puree -- Oral - Mech Soft -- Oral - Regular -- Oral - Multi-Consistency -- Oral - Pill -- Oral Phase - Comment --  CHL IP PHARYNGEAL PHASE 05/28/2018 Pharyngeal Phase WFL Pharyngeal- Pudding Teaspoon -- Pharyngeal -- Pharyngeal- Pudding Cup -- Pharyngeal -- Pharyngeal- Honey Teaspoon -- Pharyngeal -- Pharyngeal- Honey Cup -- Pharyngeal -- Pharyngeal- Nectar Teaspoon -- Pharyngeal --  Pharyngeal- Nectar Cup -- Pharyngeal -- Pharyngeal- Nectar Straw -- Pharyngeal -- Pharyngeal- Thin Teaspoon -- Pharyngeal -- Pharyngeal- Thin Cup -- Pharyngeal -- Pharyngeal- Thin Straw -- Pharyngeal -- Pharyngeal- Puree -- Pharyngeal -- Pharyngeal- Mechanical Soft -- Pharyngeal -- Pharyngeal- Regular -- Pharyngeal -- Pharyngeal- Multi-consistency -- Pharyngeal -- Pharyngeal- Pill -- Pharyngeal -- Pharyngeal Comment --  CHL IP CERVICAL ESOPHAGEAL PHASE 05/28/2018 Cervical Esophageal Phase WFL Pudding Teaspoon -- Pudding Cup -- Honey Teaspoon -- Honey Cup -- Nectar Teaspoon -- Nectar Cup -- Nectar Straw -- Thin Teaspoon -- Thin Cup -- Thin Straw -- Puree -- Mechanical Soft -- Regular -- Multi-consistency -- Pill -- Cervical Esophageal Comment -- Blenda Mounts Laurice 05/28/2018, 12:57 PM                 TODAY-DAY OF DISCHARGE:  Subjective:   Dyann Ruddle today remains pleasantly confused  Objective:   Blood pressure (!) 110/56, pulse 68, temperature (!)  97.4 F (36.3 C), temperature source Oral, resp. rate 17, height 5' 4.8" (1.646 m), weight 58.5 kg, SpO2 99 %.  Intake/Output Summary (Last 24 hours) at 06/14/2018 0930 Last data filed at 06/13/2018 2300 Gross per 24 hour  Intake 240 ml  Output 900 ml  Net -660 ml   Filed Weights   06/09/18 1055 06/10/18 0300  Weight: 62.9 kg 58.5 kg    Exam: Awake Alert but pleasantly confused, No new F.N deficits, Normal affect [place].AT,PERRAL Supple Neck,No JVD, No cervical lymphadenopathy appriciated.  Symmetrical Chest wall movement, Good air movement bilaterally, CTAB RRR,No Gallops,Rubs or new Murmurs, No Parasternal Heave +ve B.Sounds, Abd Soft, Non tender, No organomegaly appriciated, No rebound -guarding or rigidity. No Cyanosis, Clubbing or edema, No new Rash or bruise   PERTINENT RADIOLOGIC STUDIES: Dg Chest 2 View  Result Date: 05/24/2018 CLINICAL DATA:  Dyspnea with increasing lethargy and fever for a few days. EXAM: CHEST - 2 VIEW COMPARISON:  None. FINDINGS: Normal heart size and mediastinal contours with aortic atherosclerosis. Slight uncoiling of the thoracic aorta is identified. Patchy pulmonary consolidation in the right upper lobe distribution is noted consistent with pneumonia. Left lung appears clear. No acute osseous abnormality is noted. IMPRESSION: Right upper lobe pneumonia. Followup PA and lateral chest X-ray is recommended in 3-4 weeks following trial of antibiotic therapy to ensure resolution and exclude underlying malignancy. Electronically Signed   By: Tollie Eth M.D.   On: 05/24/2018 14:53   Ct Angio Chest Pe W Or Wo Contrast  Result Date: 05/24/2018 CLINICAL DATA:  Shortness of breath and lethargy EXAM: CT ANGIOGRAPHY CHEST WITH CONTRAST TECHNIQUE: Multidetector CT imaging of the chest was performed using the standard protocol during bolus administration of intravenous contrast. Multiplanar CT image reconstructions and MIPs were obtained to evaluate the vascular  anatomy. Examination is somewhat limited by patient motion artifact. CONTRAST:  ISOVUE-370 IOPAMIDOL (ISOVUE-370) INJECTION 76% COMPARISON:  None. FINDINGS: Cardiovascular: Thoracic aorta demonstrates diffuse atherosclerotic calcifications without aneurysmal dilatation or dissection. No cardiac enlargement is seen. Mild coronary calcifications are noted. The pulmonary artery is well visualized centrally without evidence pulmonary emboli. The peripheral branches are somewhat bolus as well as patient motion artifact during the exam. Mediastinum/Nodes: The esophagus is within normal limits. No hilar or mediastinal adenopathy is seen. The thoracic inlet is unremarkable. Lungs/Pleura: Left lung is well aerated without focal infiltrate or sizable effusion. The right lung demonstrates multifocal infiltrate consistent with acute pneumonia particularly within the right upper lobe but to a lesser degree in the right lower lobe.  Small right effusion is seen. Upper Abdomen: Visualized upper abdomen is within normal limits. Musculoskeletal: Degenerative changes of the thoracic spine noted. Review of the MIP images confirms the above findings. IMPRESSION: No definitive pulmonary emboli are seen although peripheral branches are somewhat limited due to patient motion artifact. Patchy multifocal infiltrates involving the right upper and right lower lobes. Small right pleural effusion. Aortic Atherosclerosis (ICD10-I70.0). Electronically Signed   By: Alcide Clever M.D.   On: 05/24/2018 21:47   Dg Swallowing Func-speech Pathology  Result Date: 05/28/2018 Objective Swallowing Evaluation: Type of Study: MBS-Modified Barium Swallow Study  Patient Details Name: JAQUELINNE GLENDENING MRN: [id number] Date of Birth: 02/04/1927 Today's Date: 05/28/2018 Time: SLP Start Time (ACUTE ONLY): 1235 -SLP Stop Time (ACUTE ONLY): 1255 SLP Time Calculation (min) (ACUTE ONLY): 20 min Past Medical History: Past Medical History: Diagnosis Date . Aortic  atherosclerosis (HCC) 05/26/2018 . Chronic kidney disease 1998  stones . Depression with anxiety 09/14/2014 . Dyspnea  . Fibrocystic breast 01/12/2011 . Glaucoma   both eyes . History of chicken pox  . Hypertension  . Insomnia 01/13/2011 . Kidney stone   stones  . Memory loss 10/12/2012 . Nausea 05/31/2012 . Spinal stenosis 06/14/2011 . Stroke (HCC) 04/2007  x 3 total . Thyroid disease   hypothyroid . Urinary frequency 02/09/2011 . Vertigo 02/09/2011 Past Surgical History: Past Surgical History: Procedure Laterality Date . ABDOMINAL HYSTERECTOMY  1963 . BACK SURGERY  1989  discectomy L4-5 1989, good results . BREAST SURGERY  1970  biopsy, benign, b/l HPI: 85 y.o. female with Past medical history of CVA, dementia, hypothyroidism, depression, HTN. Admitted with pna  Subjective: alert Assessment / Plan / Recommendation CHL IP CLINICAL IMPRESSIONS 05/28/2018 Clinical Impression Pt presents with functional oropharyngeal swallow marked by adequate mastication, reliable laryngeal vestibule closure with no penetration/aspiration; no residue post-swallow with normal pharyngeal squeeze.  Material passed through UES with no resistance.  No indication of a dysphagia-related aspiration pna.  Continue regular diet, thin liquids; no SLP f/u warranted.  SLP Visit Diagnosis Dysphagia, unspecified (R13.10) Attention and concentration deficit following -- Frontal lobe and executive function deficit following -- Impact on safety and function No limitations   CHL IP TREATMENT RECOMMENDATION 05/28/2018 Treatment Recommendations No treatment recommended at this time   Prognosis 05/25/2018 Prognosis for Safe Diet Advancement Good Barriers to Reach Goals Cognitive deficits Barriers/Prognosis Comment -- CHL IP DIET RECOMMENDATION 05/28/2018 SLP Diet Recommendations Regular solids;Thin liquid Liquid Administration via Straw;Cup Medication Administration Whole meds with liquid Compensations -- Postural Changes --   CHL IP OTHER RECOMMENDATIONS  05/28/2018 Recommended Consults -- Oral Care Recommendations Oral care BID Other Recommendations --   CHL IP FOLLOW UP RECOMMENDATIONS 05/28/2018 Follow up Recommendations None   CHL IP FREQUENCY AND DURATION 05/25/2018 Speech Therapy Frequency (ACUTE ONLY) min 1 x/week Treatment Duration 1 week      CHL IP ORAL PHASE 05/28/2018 Oral Phase WFL Oral - Pudding Teaspoon -- Oral - Pudding Cup -- Oral - Honey Teaspoon -- Oral - Honey Cup -- Oral - Nectar Teaspoon -- Oral - Nectar Cup -- Oral - Nectar Straw -- Oral - Thin Teaspoon -- Oral - Thin Cup -- Oral - Thin Straw -- Oral - Puree -- Oral - Mech Soft -- Oral - Regular -- Oral - Multi-Consistency -- Oral - Pill -- Oral Phase - Comment --  CHL IP PHARYNGEAL PHASE 05/28/2018 Pharyngeal Phase WFL Pharyngeal- Pudding Teaspoon -- Pharyngeal -- Pharyngeal- Pudding Cup -- Pharyngeal -- Pharyngeal- Honey Teaspoon -- Pharyngeal --  Pharyngeal- Honey Cup -- Pharyngeal -- Pharyngeal- Nectar Teaspoon -- Pharyngeal -- Pharyngeal- Nectar Cup -- Pharyngeal -- Pharyngeal- Nectar Straw -- Pharyngeal -- Pharyngeal- Thin Teaspoon -- Pharyngeal -- Pharyngeal- Thin Cup -- Pharyngeal -- Pharyngeal- Thin Straw -- Pharyngeal -- Pharyngeal- Puree -- Pharyngeal -- Pharyngeal- Mechanical Soft -- Pharyngeal -- Pharyngeal- Regular -- Pharyngeal -- Pharyngeal- Multi-consistency -- Pharyngeal -- Pharyngeal- Pill -- Pharyngeal -- Pharyngeal Comment --  CHL IP CERVICAL ESOPHAGEAL PHASE 05/28/2018 Cervical Esophageal Phase WFL Pudding Teaspoon -- Pudding Cup -- Honey Teaspoon -- Honey Cup -- Nectar Teaspoon -- Nectar Cup -- Nectar Straw -- Thin Teaspoon -- Thin Cup -- Thin Straw -- Puree -- Mechanical Soft -- Regular -- Multi-consistency -- Pill -- Cervical Esophageal Comment -- Blenda Mounts Laurice 05/28/2018, 12:57 PM                PERTINENT LAB RESULTS: CBC: Recent Labs    06/13/18 0409 06/14/18 0257  WBC 4.2 5.0  HGB 9.5* 9.9*  HCT 29.9* 30.1*  PLT 194 219   CMET CMP       Component Value Date/Time   NA 140 06/10/2018 0502   K 3.0 (L) 06/10/2018 0502   CL 110 06/10/2018 0502   CO2 24 06/10/2018 0502   GLUCOSE 87 06/10/2018 0502   BUN 7 (L) 06/10/2018 0502   CREATININE 0.82 06/10/2018 0502   CREATININE 0.83 10/03/2013 1501   CALCIUM 8.2 (L) 06/10/2018 0502   PROT 5.7 (L) 06/08/2018 2010   ALBUMIN 2.4 (L) 06/08/2018 2010   AST 18 06/08/2018 2010   ALT 14 06/08/2018 2010   ALKPHOS 52 06/08/2018 2010   BILITOT 0.4 06/08/2018 2010   GFRNONAA >60 06/10/2018 0502   GFRAA >60 06/10/2018 0502    GFR Estimated Creatinine Clearance: 41.5 mL/min (by C-G formula based on SCr of 0.82 mg/dL). No results for input(s): LIPASE, AMYLASE in the last 72 hours. No results for input(s): CKTOTAL, CKMB, CKMBINDEX, TROPONINI in the last 72 hours. Invalid input(s): POCBNP No results for input(s): DDIMER in the last 72 hours. No results for input(s): HGBA1C in the last 72 hours. No results for input(s): CHOL, HDL, LDLCALC, TRIG, CHOLHDL, LDLDIRECT in the last 72 hours. No results for input(s): TSH, T4TOTAL, T3FREE, THYROIDAB in the last 72 hours.  Invalid input(s): FREET3 No results for input(s): VITAMINB12, FOLATE, FERRITIN, TIBC, IRON, RETICCTPCT in the last 72 hours. Coags: No results for input(s): INR in the last 72 hours.  Invalid input(s): PT Microbiology: Recent Results (from the past 240 hour(s))  MRSA PCR Screening     Status: None   Collection Time: 06/09/18 12:49 AM  Result Value Ref Range Status   MRSA by PCR NEGATIVE NEGATIVE Final    Comment:        The GeneXpert MRSA Assay (FDA approved for NASAL specimens only), is one component of a comprehensive MRSA colonization surveillance program. It is not intended to diagnose MRSA infection nor to guide or monitor treatment for MRSA infections. Performed at [facility], [address] [place], [place] [address]     FURTHER DISCHARGE INSTRUCTIONS:  Get Medicines reviewed and  adjusted: Please take all your medications with you for your next visit with your Primary MD  Laboratory/radiological data: Please request your Primary MD to go over all hospital tests and procedure/radiological results at the follow up, please ask your Primary MD to get all Hospital records sent to his/her office.  In some cases, they will be blood work, cultures and biopsy  results pending at the time of your discharge. Please request that your primary care M.D. goes through all the records of your hospital data and follows up on these results.  Also Note the following: If you experience worsening of your admission symptoms, develop shortness of breath, life threatening emergency, suicidal or homicidal thoughts you must seek medical attention immediately by calling 911 or calling your MD immediately  if symptoms less severe.  You must read complete instructions/literature along with all the possible adverse reactions/side effects for all the Medicines you take and that have been prescribed to you. Take any new Medicines after you have completely understood and accpet all the possible adverse reactions/side effects.   Do not drive when taking Pain medications or sleeping medications (Benzodaizepines)  Do not take more than prescribed Pain, Sleep and Anxiety Medications. It is not advisable to combine anxiety,sleep and pain medications without talking with your primary care practitioner  Special Instructions: If you have smoked or chewed Tobacco  in the last 2 yrs please stop smoking, stop any regular Alcohol  and or any Recreational drug use.  Wear Seat belts while driving.  Please note: You were cared for by a hospitalist during your hospital stay. Once you are discharged, your primary care physician will handle any further medical issues. Please note that NO REFILLS for any discharge medications will be authorized once you are discharged, as it is imperative that you return to your primary  care physician (or establish a relationship with a primary care physician if you do not have one) for your post hospital discharge needs so that they can reassess your need for medications and monitor your lab values.  Total Time spent coordinating discharge including counseling, education and face to face time equals 35 minutes.  Signed: Stanislawa Gaffin 06/14/2018 9:30 AM

## 2018-06-14 NOTE — Progress Notes (Signed)
Patient will DC to: [facility] Anticipated DC date: 06/14/18 Family notified: [name] Transport by: Zella Richer   Per MD patient ready for DC to [facility]. RN, patient, patient's family, and facility notified of DC. Discharge Summary and FL2 sent to facility. RN to call report prior to discharge [phone]). DC packet on chart. Ambulance transport requested for patient.   CSW will sign off for now as social work intervention is no longer needed. Please consult [place] again if new needs arise.  Cristobal Goldmann, LCSW Clinical Social Worker [phone]

## 2018-06-14 NOTE — Progress Notes (Signed)
Darlene Wilson to be D/C'd to [facility] per MD order. Discussed with the patient and all questions fully answered.   VVS, Skin clean, dry and intact without evidence of skin break down, no evidence of skin tears noted.  IV catheter discontinued intact. Site without signs and symptoms of complications. Dressing and pressure applied.  An After Visit Summary was printed and given to the patients daughter as well as PTAR Patient escorted via stretcher and D/C to [facility] Report called and given to Mia. Jeralene Peters  06/14/2018 3:41 PM

## 2018-06-14 NOTE — Clinical Social Work Placement (Signed)
   CLINICAL SOCIAL WORK PLACEMENT  NOTE  Date:  06/14/2018  Patient Details  Name: Darlene Wilson MRN: [id number] Date of Birth: 1926-10-30  Clinical Social Work is seeking post-discharge placement for this patient at the Skilled  Nursing Facility level of care (*CSW will initial, date and re-position this form in  chart as items are completed):  Yes   Patient/family provided with [place] Clinical Social Work Department's list of facilities offering this level of care within the geographic area requested by the patient (or if unable, by the patient's family).  Yes   Patient/family informed of their freedom to choose among providers that offer the needed level of care, that participate in Medicare, Medicaid or managed care program needed by the patient, have an available bed and are willing to accept the patient.  Yes   Patient/family informed of [facility] ownership interest in [facility] and [facility], as well as of the fact that they are under no obligation to receive care at these facilities.  PASRR submitted to EDS on 06/13/18     PASRR number received on 06/13/18     Existing PASRR number confirmed on       FL2 transmitted to all facilities in geographic area requested by pt/family on 06/13/18     FL2 transmitted to all facilities within larger geographic area on       Patient informed that his/her managed care company has contracts with or will negotiate with certain facilities, including the following:        Yes   Patient/family informed of bed offers received.  Patient chooses bed at [facility]     Physician recommends and patient chooses bed at      Patient to be transferred to [facility] on 06/14/18.  Patient to be transferred to facility by PTAR     Patient family notified on 06/14/18 of transfer.  Name of family member notified:  Kriste Basque, daughter in law     PHYSICIAN       Additional Comment:     _______________________________________________ Mearl Latin, LCSW 06/14/2018, 9:48 AM

## 2018-06-20 ENCOUNTER — Other Ambulatory Visit: Payer: Self-pay | Admitting: *Deleted

## 2018-06-20 NOTE — Patient Outreach (Signed)
[facility] Customer service manager [facility]) Care Management  06/20/2018  JULIEN BEERE 07-06-1926 [id number]   Update on patient from [facility] UM nurse, Tammy from [facility] rehab IDT meeting. Patient is from memory care unit at [facility], patient discharge plan is to return upon completion of skilled rehab. No [facility] care management needs identifed. Plan to sign off. Alben Spittle. Albertha Ghee, MSN, RN, [facility], CCM  Post Acute Chartered loss adjuster [phone]) Business Cell  [phone]) Toll Free Office

## 2018-06-25 NOTE — Progress Notes (Signed)
Canceled visit. Holly Bodily NP-C

## 2018-06-26 ENCOUNTER — Non-Acute Institutional Stay: Payer: Medicare Other | Admitting: Internal Medicine

## 2018-06-27 ENCOUNTER — Non-Acute Institutional Stay: Payer: Medicare Other | Admitting: Licensed Clinical Social Worker

## 2018-06-27 DIAGNOSIS — Z515 Encounter for palliative care: Secondary | ICD-10-CM

## 2018-06-28 NOTE — Progress Notes (Signed)
COMMUNITY PALLIATIVE CARE SW NOTE  PATIENT NAME: Darlene Wilson DOB: 07/02/1926 MRN: [id number]  PRIMARY CARE PROVIDER: Blyth, Stacey A, MD  RESPONSIBLE PARTY:  Acct ID - Guarantor Home Phone Work Phone Relationship Acct Type  [id number] - Ruark,JAN* [phone]  Self P/F     [address], [place], [place] [address]     PLAN OF CARE and INTERVENTIONS:             1. GOALS OF CARE/ ADVANCE CARE PLANNING:  Goal is for patient's appetite to increase.  She has a DNR and MOST form. 2. SOCIAL/EMOTIONAL/SPIRITUAL ASSESSMENT/ INTERVENTIONS:  SW met with patient at [facility].  She was in her w/c near the nurses station talking nonsensically to herself.  Facility staff reports she was started on Remeron to help stimulate her appetite.  When SW spoke to her directly, she answered questions.  She gave good eye contact.  She said she was born and raised in [place] and has one daughter.  She was oriented to herself, but not place or date/time.  She is of [employer] faith. 3. PATIENT/CAREGIVER EDUCATION/ COPING:  Patient expressed her thoughts openly.  Informed facility SW, Peggy, of patient's Palliative Care status. 4. PERSONAL EMERGENCY PLAN:  Per facility Protocol. 5. COMMUNITY RESOURCES COORDINATION/ HEALTH CARE NAVIGATION:  None. 6. FINANCIAL/LEGAL CONCERNS/INTERVENTIONS:  None identified.     SOCIAL HX:  Social History   Tobacco Use  . Smoking status: Never Smoker  . Smokeless tobacco: Never Used  Substance Use Topics  . Alcohol use: No    CODE STATUS:  DNR  ADVANCED DIRECTIVES: N MOST FORM COMPLETE:  Yes - Limited interventions, ABT if indicated, IV trial and no feeding tube. HOSPICE EDUCATION PROVIDED: N PPS:  Patient's appetite has decreased.  She was in her w/c during visit. Duration of visit and documentation:  45 minutes. Plan:  Contact patient's daughter to assess coping.       Z , LCSW 

## 2018-06-29 ENCOUNTER — Non-Acute Institutional Stay: Payer: Medicare Other | Admitting: Internal Medicine

## 2018-06-29 ENCOUNTER — Encounter: Payer: Self-pay | Admitting: Internal Medicine

## 2018-06-29 NOTE — Progress Notes (Signed)
Appointment canceled; no charge. Mary Serpe NP-C [phone]  

## 2018-07-27 ENCOUNTER — Non-Acute Institutional Stay: Payer: Medicare Other | Admitting: Licensed Clinical Social Worker

## 2018-07-27 DIAGNOSIS — Z515 Encounter for palliative care: Secondary | ICD-10-CM

## 2018-07-30 NOTE — Progress Notes (Signed)
COMMUNITY PALLIATIVE CARE SW NOTE  PATIENT NAME: Darlene Wilson DOB: 1927-04-14 MRN: [id number]  PRIMARY CARE PROVIDER: Mosie Lukes, MD  RESPONSIBLE PARTY:  Acct ID - Guarantor Home Phone Work Phone Relationship Acct Type  [id number] Darlene Wilson* [phone]  Self P/F     [place], [place], [place] [address]     PLAN OF CARE and INTERVENTIONS:             1. GOALS OF CARE/ ADVANCE CARE PLANNING:  Patient's goal is for her appetite to increase.  She has a MOST form and DNR. 2. SOCIAL/EMOTIONAL/SPIRITUAL ASSESSMENT/ INTERVENTIONS:  SW met with patient at [facility] on the memory care unit.  She did not respond to verbal/tactile prompts.  She kept saying, "help me", but did not display any distress.  Facility staff reported this is common behavior for patient.  She did not display any other  nonverbal indicators of pain. 3. PATIENT/CAREGIVER EDUCATION/ COPING:  Provided education to the facility Med Maury Dus, regarding Palliative Care. 4. PERSONAL EMERGENCY PLAN:  Per facility protocol. 5. COMMUNITY RESOURCES COORDINATION/ HEALTH CARE NAVIGATION:  None. 6. FINANCIAL/LEGAL CONCERNS/INTERVENTIONS:  None.     SOCIAL HX:  Social History   Tobacco Use  . Smoking status: Never Smoker  . Smokeless tobacco: Never Used  Substance Use Topics  . Alcohol use: No    CODE STATUS:  DNR  ADVANCED DIRECTIVES: N MOST FORM COMPLETE:  Y HOSPICE EDUCATION PROVIDED:  Y PPS:  Staff did not report an appetite change.  She remains in her w/c. Duration of visit and documentation:  45 minutes.      Creola Corn Makaylen Thieme, LCSW

## 2018-08-22 ENCOUNTER — Telehealth: Payer: Self-pay | Admitting: Adult Health

## 2018-08-22 NOTE — Telephone Encounter (Addendum)
Due to current COVID19 pandemic, our office is currently limiting face-to-face visits to emergent visits only.  Patient is currently being followed in this office for Alzheimer's disease therefore no indication for a face-to-face visit at this time and not appropriate for telemedicine visit due to cognitive deficit and currently residing in a memory care unit assisted living facility.  She is currently being followed by palliative care where she resides at [facility] assisted living on the memory care unit. We can hold off at this time on rescheduling to come in to the office as she is followed closely by nursing home staff and palliative care. If family wishes to bring her in for evaluation or has any questions, we can reschedule appointment as needed

## 2018-08-22 NOTE — Telephone Encounter (Signed)
Reached out to patient and spoke with her daughter-in-law. She agrees with this plan of care due to COVID-19 and she believes the patient is receiving the appropriate care at her nursing facility. She agrees to calling back if needed.

## 2018-08-23 ENCOUNTER — Emergency Department (HOSPITAL_COMMUNITY)
Admission: EM | Admit: 2018-08-23 | Discharge: 2018-08-23 | Disposition: A | Discharge status: Home / Self Care | Payer: Medicare Other | Attending: Emergency Medicine | Admitting: Emergency Medicine

## 2018-08-23 ENCOUNTER — Ambulatory Visit: Payer: Medicare Other | Admitting: Adult Health

## 2018-08-23 ENCOUNTER — Emergency Department (HOSPITAL_COMMUNITY): Payer: Medicare Other

## 2018-08-23 DIAGNOSIS — E039 Hypothyroidism, unspecified: Secondary | ICD-10-CM | POA: Insufficient documentation

## 2018-08-23 DIAGNOSIS — N182 Chronic kidney disease, stage 2 (mild): Secondary | ICD-10-CM | POA: Insufficient documentation

## 2018-08-23 DIAGNOSIS — R079 Chest pain, unspecified: Secondary | ICD-10-CM

## 2018-08-23 DIAGNOSIS — R0789 Other chest pain: Secondary | ICD-10-CM | POA: Insufficient documentation

## 2018-08-23 DIAGNOSIS — Z79899 Other long term (current) drug therapy: Secondary | ICD-10-CM | POA: Insufficient documentation

## 2018-08-23 DIAGNOSIS — I129 Hypertensive chronic kidney disease with stage 1 through stage 4 chronic kidney disease, or unspecified chronic kidney disease: Secondary | ICD-10-CM | POA: Diagnosis not present

## 2018-08-23 DIAGNOSIS — G309 Alzheimer's disease, unspecified: Secondary | ICD-10-CM | POA: Insufficient documentation

## 2018-08-23 LAB — URINALYSIS, ROUTINE W REFLEX MICROSCOPIC
Bilirubin Urine: NEGATIVE
GLUCOSE, UA: NEGATIVE mg/dL
Hgb urine dipstick: NEGATIVE
Ketones, ur: NEGATIVE mg/dL
Leukocytes,Ua: NEGATIVE
Nitrite: NEGATIVE
PH: 6 (ref 5.0–8.0)
Protein, ur: NEGATIVE mg/dL
Specific Gravity, Urine: 1.025 (ref 1.005–1.030)

## 2018-08-23 LAB — COMPREHENSIVE METABOLIC PANEL
ALT: 10 U/L (ref 0–44)
ANION GAP: 6 (ref 5–15)
AST: 23 U/L (ref 15–41)
Albumin: 2.7 g/dL — ABNORMAL LOW (ref 3.5–5.0)
Alkaline Phosphatase: 81 U/L (ref 38–126)
BUN: 14 mg/dL (ref 8–23)
CO2: 28 mmol/L (ref 22–32)
Calcium: 8.7 mg/dL — ABNORMAL LOW (ref 8.9–10.3)
Chloride: 106 mmol/L (ref 98–111)
Creatinine, Ser: 0.85 mg/dL (ref 0.44–1.00)
GFR calc Af Amer: >60 mL/min (ref >60)
GFR calc non Af Amer: >60 mL/min (ref >60)
GLUCOSE: 80 mg/dL (ref 70–99)
Potassium: 4 mmol/L (ref 3.5–5.1)
Sodium: 140 mmol/L (ref 135–145)
Total Bilirubin: 0.7 mg/dL (ref 0.3–1.2)
Total Protein: 6 g/dL — ABNORMAL LOW (ref 6.5–8.1)

## 2018-08-23 LAB — I-STAT TROPONIN, ED
Troponin i, poc: 0 ng/mL (ref 0.00–0.08)
Troponin i, poc: 0 ng/mL (ref 0.00–0.08)

## 2018-08-23 LAB — CBC
HCT: 39.4 % (ref 36.0–46.0)
Hemoglobin: 12.4 g/dL (ref 12.0–15.0)
MCH: 30.5 pg (ref 26.0–34.0)
MCHC: 31.5 g/dL (ref 30.0–36.0)
MCV: 96.8 fL (ref 80.0–100.0)
Platelets: 333 10*3/uL (ref 150–400)
RBC: 4.07 MIL/uL (ref 3.87–5.11)
RDW: 16.2 % — ABNORMAL HIGH (ref 11.5–15.5)
WBC: 7.8 10*3/uL (ref 4.0–10.5)
nRBC: 0 % (ref 0.0–0.2)

## 2018-08-23 NOTE — Discharge Instructions (Addendum)
You have been seen today for chest pain. Please read and follow all provided instructions.   1. Medications: usual home medications 2. Treatment: rest, drink plenty of fluids 3. Follow Up: Please follow up with your primary doctor in 2-5 days for discussion of your diagnoses and further evaluation after today's visit; if you do not have a primary care doctor use the resource guide provided to find one; Please return to the ER for any new or worsening symptoms. Please obtain all of your results from medical records or have your doctors office obtain the results - share them with your doctor - you should be seen at your doctors office. Call today to arrange your follow up.   Take medications as prescribed. Please review all of the medicines and only take them if you do not have an allergy to them. Return to the emergency room for worsening condition or new concerning symptoms. Follow up with your regular doctor. If you don't have a regular doctor use one of the numbers below to establish a primary care doctor.  Please be aware that if you are taking birth control pills, taking other prescriptions, ESPECIALLY ANTIBIOTICS may make the birth control ineffective - if this is the case, either do not engage in sexual activity or use alternative methods of birth control such as condoms until you have finished the medicine and your family doctor says it is OK to restart them. If you are on a blood thinner such as COUMADIN, be aware that any other medicine that you take may cause the coumadin to either work too much, or not enough - you should have your coumadin level rechecked in next 7 days if this is the case.  ?  It is also a possibility that you have an allergic reaction to any of the medicines that you have been prescribed - Everybody reacts differently to medications and while MOST people have no trouble with most medicines, you may have a reaction such as nausea, vomiting, rash, swelling, shortness of breath.  If this is the case, please stop taking the medicine immediately and contact your physician.  ?  You should return to the ER if you develop severe or worsening symptoms. Please return to ER if chest pain becomes exertional, associated with sweating/nausea, radiates to left jaw/arm, or worsens/becomes concerning in any way.   Emergency Department Resource Guide 1) Find a Doctor and Pay Out of Pocket Although you won't have to find out who is covered by your insurance plan, it is a good idea to ask around and get recommendations. You will then need to call the office and see if the doctor you have chosen will accept you as a new patient and what types of options they offer for patients who are self-pay. Some doctors offer discounts or will set up payment plans for their patients who do not have insurance, but you will need to ask so you aren't surprised when you get to your appointment.  2) Contact Your Local Health Department Not all health departments have doctors that can see patients for sick visits, but many do, so it is worth a call to see if yours does. If you don't know where your local health department is, you can check in your phone book. The CDC also has a tool to help you locate your state's health department, and many state websites also have listings of all of their local health departments.  3) Find a Walk-in Clinic If your illness is not  likely to be very severe or complicated, you may want to try a walk in clinic. These are popping up all over the country in pharmacies, drugstores, and shopping centers. They're usually staffed by nurse practitioners or physician assistants that have been trained to treat common illnesses and complaints. They're usually fairly quick and inexpensive. However, if you have serious medical issues or chronic medical problems, these are probably not your best option.  No Primary Care Doctor: Call Health Connect at  [phone] - they can help you locate a  primary care doctor that  accepts your insurance, provides certain services, etc. Physician Referral Service- [phone]  Chronic Pain Problems: Organization         Address  Phone   Notes  [facility]  [phone] Patients need to be referred by their primary care doctor.   Medication Assistance: Organization         Address  Phone   Notes  [facility] Medication [facility] [address] [place], [place] [address] [phone] --Must be a resident of [facility] -- Must have NO insurance coverage whatsoever (no Medicaid/ Medicare, etc.) -- The pt. MUST have a primary care doctor that directs their care regularly and follows them in the community   [facility]  [phone]   [employer]  [phone]    Agencies that provide inexpensive medical care: Organization         Address  Phone   Notes  [place]  [phone]   [facility]    [phone]   [facility] [place], [place] [address] [phone]   [place] [address], [place] [phone]   [facility]    [phone]   [facility]    [phone]   [place] and [place] [address], [place] Phone:  [phone], Fax:  [phone] Hours of Operation:  9 am - 6 pm, M-F.  Also accepts Medicaid/Medicare and self-pay.  [facility] for [place] [address], [address], [place] Phone: [phone], Fax: [phone]. Hours of Operation:  8:30 am - 5:30 pm, M-F.  Also accepts Medicaid and self-pay.  [facility] [address], [place] Phone: [phone]   [place], [place], [place] [phone], Ext. [phone] Mondays & Thursdays: 7-9 AM.  First 15 patients are seen on a first come, first serve basis.    [place]  Providers:  Organization         Address  Phone   Notes  [facility] [address], [place] [phone] Also accepts self-pay patients.  [facility] [address], [place]  [phone]   [place], [address], [place] [phone]   [facility] [address] [place] [phone]   Lucianne Lei [address], [place]   [phone] Only accepts [place] [facility] [place] patients after they have their name applied to their card.   Self-Pay (no insurance) in [place] [facility]:  Organization         Address  Phone   Notes  Sickle Cell Patients, [facility] [place] [phone]   [facility] [place] [phone]   [facility] [place]  [place], [address], [place] [phone]   [facility]/Dr. Osei-Bonsu  [address], [place] or [address], [place] [phone] Phone number for both [place] and [place] locations is the same.  [facility] [address] [place] [phone]   [facility] [address], [place] or [address] [phone]   [facility] [address] [place] [phone], phone; [phone], fax Sees patients 1st and 3rd Saturday of every month.  Must not qualify for public or private insurance (i.e. Medicaid, Medicare, [place] Health Choice, Veterans' Benefits)  Household income should be no more than 200% of the poverty level The clinic cannot treat you if you are pregnant or think you are pregnant  Sexually transmitted diseases are not treated at the clinic.

## 2018-08-23 NOTE — ED Triage Notes (Addendum)
Pt brought in by ems from [facility] for c/o chest pressure that started around 9am along with some SOB ; upon ems arrival patient was no longer having chest pain ; pt has hx of dementia and is alert and oriented x 1 and this is baseline for her ; pt denies any further chest pain at this time; pt received 324 of asa piror to arrival

## 2018-08-23 NOTE — ED Provider Notes (Signed)
[facility] EMERGENCY DEPARTMENT Provider Note   CSN: [id number] Arrival date & time: 08/23/18  1054    History   Chief Complaint Chief Complaint  Patient presents with  . Chest Pain    HPI Darlene Wilson is a 85 y.o. female with a PMH of HTN, Alzheimer's Dementia, CKD, and CVA presenting with intermittent central non radiating chest pain onset today at 9am while sitting. Patient arrived via EMS from [facility]. Patient described chest pain as pressure. Patient reports associated shortness of breath while she had the episode of chest pain. Patient denies shortness of breath or chest pain currently. Patient states nothing made symptoms better or worse. Patient denies nausea, vomiting, abdominal pain, leg pain/edema, recent surgery, recent travel, fever, cough, congestion, or diaphoresis. Patient denies any acute complaints. Last echo was on 02/2015 and reveals LVEF 55-60%. Patient is a poor historian due to dementia.   Level 5 caveat.     HPI  Past Medical History:  Diagnosis Date  . Aortic atherosclerosis (HCC) 05/26/2018  . Chronic kidney disease 1998   stones  . Depression with anxiety 09/14/2014  . Dyspnea   . Fibrocystic breast 01/12/2011  . Glaucoma    both eyes  . History of chicken pox   . Hypertension   . Insomnia 01/13/2011  . Kidney stone    stones   . Memory loss 10/12/2012  . Nausea 05/31/2012  . Spinal stenosis 06/14/2011  . Stroke (HCC) 04/2007   x 3 total  . Thyroid disease    hypothyroid  . Urinary frequency 02/09/2011  . Vertigo 02/09/2011    Patient Active Problem List   Diagnosis Date Noted  . GI bleed 06/08/2018  . CKD (chronic kidney disease) stage 2, GFR 60-89 ml/min 06/08/2018  . Lobar pneumonia (HCC) 05/26/2018  . Aortic atherosclerosis (HCC) 05/26/2018  . Alzheimer disease (HCC) 08/10/2016  . Malnutrition of moderate degree 03/10/2015  . Syncope 03/08/2015  . Glaucoma 03/08/2015  . Hearing loss 09/14/2014  .  Depression with anxiety 09/14/2014  . Medicare annual wellness visit, subsequent 04/13/2014  . Hyperglycemia 10/06/2013  . Neck pain 11/20/2012  . Dizziness and giddiness 11/08/2012  . Degeneration of cervical intervertebral disc 11/08/2012  . Cervical spondylosis without myelopathy 11/08/2012  . Other malaise and fatigue 11/08/2012  . Disturbance of skin sensation 11/08/2012  . Hemiplegia of dominant side as late effect following cerebrovascular disease (HCC) 11/08/2012  . Other late effects of cerebrovascular disease(438.89) 11/08/2012  . Memory loss 10/12/2012  . Spinal stenosis 06/14/2011  . Urinary frequency 02/09/2011  . Vertigo 02/09/2011  . Insomnia 01/13/2011  . Fibrocystic breast 01/12/2011  . Hypertension   . Kidney stone   . Thyroid disease   . Stroke [facility]) 04/30/2007    Past Surgical History:  Procedure Laterality Date  . ABDOMINAL HYSTERECTOMY  1963  . BACK SURGERY  1989   discectomy L4-5 1989, good results  . BREAST SURGERY  1970   biopsy, benign, b/l  . ESOPHAGOGASTRODUODENOSCOPY (EGD) WITH PROPOFOL N/A 06/12/2018   Procedure: ESOPHAGOGASTRODUODENOSCOPY (EGD) WITH PROPOFOL;  Surgeon: Kathi Der, MD;  Location: [facility] ENDOSCOPY;  Service: Gastroenterology;  Laterality: N/A;     OB History   No obstetric history on file.      Home Medications    Prior to Admission medications   Medication Sig Start Date End Date Taking? Authorizing Provider  acetaminophen (TYLENOL) 325 MG tablet Take 2 tablets (650 mg total) by mouth every 6 (six) hours as needed.  Patient taking differently: Take 650 mg by mouth every 6 (six) hours as needed (for pain).  11/21/12   Christiane Ha, MD  albuterol (PROVENTIL HFA;VENTOLIN HFA) 108 (90 Base) MCG/ACT inhaler Inhale 2 puffs into the lungs every 4 (four) hours as needed for wheezing or shortness of breath. 01/15/18   Shon Hale, MD  amLODipine (NORVASC) 5 MG tablet Take 1 tablet (5 mg total) by mouth daily. 01/15/18    Shon Hale, MD  betaxolol (BETOPTIC-S) 0.5 % ophthalmic suspension Place 1 drop into both eyes 2 (two) times daily. 01/23/18   [provider]  citalopram (CELEXA) 10 MG tablet Take 10 mg by mouth daily.    [provider]  donepezil (ARICEPT) 10 MG tablet Take 1 tablet (10 mg total) by mouth at bedtime. 01/23/18   [provider]  gabapentin (NEURONTIN) 100 MG capsule TAKE 1 CAPSULE (100 MG TOTAL) BY MOUTH AT BEDTIME. Patient taking differently: Take 100 mg by mouth at bedtime.  01/15/18   Shon Hale, MD  ipratropium (ATROVENT) 0.02 % nebulizer solution Take 2.5 mLs (0.5 mg total) by nebulization every 6 (six) hours as needed for wheezing or shortness of breath. 05/30/18   Rhetta Mura, MD  latanoprost (XALATAN) 0.005 % ophthalmic solution Place 1 drop into both eyes at bedtime. 01/23/18   [provider]  levothyroxine (SYNTHROID, LEVOTHROID) 75 MCG tablet Take 0.5 tablets (37.5 mcg total) by mouth daily. Patient taking differently: Take 37.5 mcg by mouth daily before breakfast.  01/15/18   Shon Hale, MD  mirtazapine (REMERON) 7.5 MG tablet Take 7.5 mg by mouth at bedtime.    [provider]  Nutritional Supplements (NUTRITIONAL DRINK PO) Take 120 mLs by mouth. medpass bid    [provider]  OLANZapine (ZYPREXA) 2.5 MG tablet Take 1 tablet (2.5 mg total) by mouth every evening. 05/30/18   Rhetta Mura, MD  pantoprazole (PROTONIX) 40 MG tablet Take 1 tablet (40 mg total) by mouth daily. 06/14/18   Ghimire, Werner Lean, MD    Family History Family History  Problem Relation Age of Onset  . Heart disease Mother   . Hypertension Mother   . Diabetes Mother   . Heart disease Father   . Hypertension Father   . Cancer Sister        breast cancer  . Mental illness Brother        service connected    Social History Social History   Tobacco Use  . Smoking status: Never Smoker  . Smokeless tobacco: Never Used   Substance Use Topics  . Alcohol use: No  . Drug use: No     Allergies   Ciprofloxacin; Combigan [brimonidine tartrate-timolol]; Detrol [tolterodine tartrate]; Oxycodone; Penicillins; and Sulfa antibiotics   Review of Systems Review of Systems  Unable to perform ROS: Dementia     Physical Exam Updated Vital Signs BP (!) 109/59   Pulse 62   Temp 98.2 F (36.8 C) (Oral)   Resp 14   SpO2 96%   Physical Exam Vitals signs and nursing note reviewed.  Constitutional:      General: She is not in acute distress.    Appearance: She is well-developed. She is not diaphoretic.     Comments: Patient is pleasant and sitting up in bed in no acute distress.  HENT:     Head: Normocephalic and atraumatic.  Eyes:     Extraocular Movements: Extraocular movements intact.     Pupils: Pupils are equal, round, and reactive  to light.  Neck:     Musculoskeletal: Normal range of motion and neck supple.     Vascular: No JVD.  Cardiovascular:     Rate and Rhythm: Normal rate and regular rhythm.     Pulses: Normal pulses.          Radial pulses are 2+ on the right side and 2+ on the left side.       Dorsalis pedis pulses are 2+ on the right side and 2+ on the left side.     Heart sounds: Normal heart sounds. No murmur. No friction rub. No gallop.   Pulmonary:     Effort: Pulmonary effort is normal. No respiratory distress.     Breath sounds: Normal breath sounds. No wheezing or rales.  Chest:     Chest wall: No tenderness.  Abdominal:     Palpations: Abdomen is soft.     Tenderness: There is no abdominal tenderness.  Musculoskeletal: Normal range of motion.     Right lower leg: She exhibits no tenderness. No edema.     Left lower leg: She exhibits no tenderness. No edema.  Skin:    General: Skin is warm.     Capillary Refill: Capillary refill takes less than 2 seconds.     Coloration: Skin is not pale.     Findings: No rash.  Neurological:     Mental Status: She is alert.      Comments: Patient is alert to name only. Patient is unable to identify place, date, or date of birth. This appears to be patient's baseline due to dementia.     ED Treatments / Results  Labs (all labs ordered are listed, but only abnormal results are displayed) Labs Reviewed  CBC - Abnormal; Notable for the following components:      Result Value   RDW 16.2 (*)    All other components within normal limits  COMPREHENSIVE METABOLIC PANEL - Abnormal; Notable for the following components:   Calcium 8.7 (*)    Total Protein 6.0 (*)    Albumin 2.7 (*)    All other components within normal limits  URINALYSIS, ROUTINE W REFLEX MICROSCOPIC  I-STAT TROPONIN, ED  I-STAT TROPONIN, ED    EKG EKG Interpretation  Date/Time:  Thursday August 23 2018 14:33:33 EDT Ventricular Rate:  70 PR Interval:    QRS Duration: 91 QT Interval:  382 QTC Calculation: 413 R Axis:   8 Text Interpretation:  Sinus rhythm Low voltage, precordial leads Confirmed by Tilden Fossa [phone]) on 08/23/2018 2:42:49 PM   Radiology Dg Chest 2 View  Result Date: 08/23/2018 CLINICAL DATA:  Chest pain EXAM: CHEST - 2 VIEW COMPARISON:  05/24/2018 chest radiograph. FINDINGS: Stable cardiomediastinal silhouette with normal heart size. No pneumothorax. No pleural effusion. No pulmonary edema. Hazy and linear opacities in right mid lung are substantially decreased since 05/24/2018. No new consolidative airspace disease. IMPRESSION: Hazy and linear right mid lung opacities, substantially decreased since 05/24/2018 chest radiograph, favoring residual postinfectious scarring. No acute cardiopulmonary disease. Electronically Signed   By: Delbert Phenix M.D.   On: 08/23/2018 11:46    Procedures Procedures (including critical care time)  Medications Ordered in ED Medications - No data to display   Initial Impression / Assessment and Plan / ED Course  I have reviewed the triage vital signs and the nursing notes.  Pertinent labs &  imaging results that were available during my care of the patient were reviewed by me and considered in my  medical decision making (see chart for details).  Clinical Course as of Aug 22 1500  Thu Aug 23, 2018  1206 CXR does not reveal acute cardiopulmonary disease. Hazy and linear right mid lung opacities, substantially decreased since 05/24/2018, favoring residual postinfectious scarring.   DG Chest 2 View [AH]  1217 Called and discussed plan with daughter, Merleen Milliner. Daughter states she understands and agrees with plan.    [AH]  1457 Spoke to daughter again and updated her on the plan again as requested.    [AH]    Clinical Course User Index [AH] Leretha Dykes, PA-C      Patient is to be discharged with recommendation to follow up with PCP in regards to today's hospital visit. Chest pain is not likely of cardiac or pulmonary etiology d/t presentation, VSS, no tracheal deviation, no JVD or new murmur, RRR, breath sounds equal bilaterally, EKG without acute abnormalities, negative troponin x 2, and negative CXR. Patient has been comfortable and asymptomatic while in the ER. Discussed return precautions with daughter and provided strict instructions for nursing facility. Patient has been advised to return to the ED if CP becomes exertional, associated with diaphoresis or nausea, radiates to left jaw/arm, worsens or becomes concerning in any way.   Case has been discussed with and seen by Dr. Madilyn Hook who agrees with the above plan to discharge.   Final Clinical Impressions(s) / ED Diagnoses   Final diagnoses:  Nonspecific chest pain    ED Discharge Orders    None       Leretha Dykes, [place] 08/23/18 1502    Tilden Fossa, MD 08/24/18 1501

## 2018-08-28 ENCOUNTER — Other Ambulatory Visit: Payer: Self-pay

## 2018-08-28 ENCOUNTER — Telehealth: Payer: Medicare Other | Admitting: Licensed Clinical Social Worker

## 2018-08-29 ENCOUNTER — Telehealth: Payer: Self-pay | Admitting: Licensed Clinical Social Worker

## 2018-08-29 NOTE — Progress Notes (Signed)
COMMUNITY PALLIATIVE CARE SW NOTE  PATIENT NAME: Darlene Wilson DOB: 17-Jul-1926 MRN: [id number]  PRIMARY CARE PROVIDER: [name], MD  RESPONSIBLE PARTY:  Acct ID - Guarantor Home Phone Work Phone Relationship Acct Type  [id number] Milton Ferguson* [phone]  Self P/F     [address], [place], [place] [address]   Due to the COVID-19 crisis, this virtual check-in visit was done via telephone from my office and it was initiated and consent by this family.  PLAN OF CARE and INTERVENTIONS:             1. GOALS OF CARE/ ADVANCE CARE PLANNING:  Daughter, Darlene Wilson, would like patient to remain at the facility.  Patient has a MOST form and DNR. 2. SOCIAL/EMOTIONAL/SPIRITUAL ASSESSMENT/ INTERVENTIONS:  SW spoke with facility staff member, Cici, at [facility].  She stated patient does not have a cough or fever.  She stated the Memory Care unit does not have access to a device for Telehealth.  SW spoke with patient's daughter and informed her of what the staff had said.  Darlene Wilson does not wish to have any type of face time with her mother due to patient's increased confusion.  [name] had conducted one with patient's sister in [place] recently and it did not go well.  [name] expressed appreciation for the contact since she cannot visit patient.   3. PATIENT/CAREGIVER EDUCATION/ COPING:  SW provided education regarding virtual visits.  Daughter copes by expressing her feelings openly. 4. PERSONAL EMERGENCY PLAN:  Per facility protocol. 5. COMMUNITY RESOURCES COORDINATION/ HEALTH CARE NAVIGATION:  None. 6. FINANCIAL/LEGAL CONCERNS/INTERVENTIONS:  None.     SOCIAL HX:  Social History   Tobacco Use  . Smoking status: Never Smoker  . Smokeless tobacco: Never Used  Substance Use Topics  . Alcohol use: No    CODE STATUS:  DNR  ADVANCED DIRECTIVES: N MOST FORM COMPLETE:  Y HOSPICE EDUCATION PROVIDED:  N PPS:  Facility staff reported patient has a good appetite.  She requires assistance from w/c  to bed. Duration of visit:  45 minutes.      Vella Kohler Damary Doland, LCSW

## 2018-08-29 NOTE — Telephone Encounter (Signed)
Palliaitve Care SW spoke with patient's daughter, Kriste Basque, and agreed to Telehealth visit at 2:30 today.

## 2018-08-30 ENCOUNTER — Other Ambulatory Visit: Payer: Self-pay

## 2018-10-26 ENCOUNTER — Telehealth: Payer: Self-pay | Admitting: Licensed Clinical Social Worker

## 2018-10-26 NOTE — Telephone Encounter (Signed)
Palliative Care SW left a vm with patient's daughter, Kriste Basque, to schedule a virtual check-in visit.

## 2018-11-05 ENCOUNTER — Non-Acute Institutional Stay: Payer: Medicare Other | Admitting: Licensed Clinical Social Worker

## 2018-11-05 ENCOUNTER — Other Ambulatory Visit: Payer: Self-pay

## 2018-11-05 DIAGNOSIS — Z515 Encounter for palliative care: Secondary | ICD-10-CM

## 2018-11-05 NOTE — Progress Notes (Signed)
COMMUNITY PALLIATIVE CARE SW NOTE  PATIENT NAME: Darlene Wilson DOB: [birth year] MRN: [id number]  PRIMARY CARE PROVIDER: Mosie Lukes, MD  RESPONSIBLE PARTY:  Acct ID - Guarantor Home Phone Work Phone Relationship Acct Type  [id number] Darlene Wilson* [phone]  Self P/F     [address], [place], [place] [address]   Due to the COVID-19 crisis, this virtual check-in visit was done via telephone from my office and it was initiated and consent by this patient and or family.  PLAN OF CARE and INTERVENTIONS:             1. GOALS OF CARE/ ADVANCE CARE PLANNING:  Goal is for patient to remain at the facility.  She has a DNR and MOST form. 2. SOCIAL/EMOTIONAL/SPIRITUAL ASSESSMENT/ INTERVENTIONS:  SW phoned patient's daughter, Darlene Wilson, and left a vm.  SW conducted a Sales executive visit with [name], [place].  She stated patient has not been eating well.  She is given two Ensures a day.  Her symptoms are currently managed.  Darlene Wilson reported patient tested negative for COVID-19. 3. PATIENT/CAREGIVER EDUCATION/ COPING:  Patient's daughter expresses her feelings openly. 4. PERSONAL EMERGENCY PLAN:  Per facility protocol. 5. COMMUNITY RESOURCES COORDINATION/ HEALTH CARE NAVIGATION:  None. 6. FINANCIAL/LEGAL CONCERNS/INTERVENTIONS:  None.     SOCIAL HX:  Social History   Tobacco Use  . Smoking status: Never Smoker  . Smokeless tobacco: Never Used  Substance Use Topics  . Alcohol use: No    CODE STATUS:  DNR  ADVANCED DIRECTIVES: N MOST FORM COMPLETE:  Y HOSPICE EDUCATION PROVIDED:  N PPS:  Facility reported patient's appetite has decreased.  She requires assistance from bed to chair. Duration of visit and documentation:  30 minutes.      Darlene Corn Nhan Qualley, LCSW

## 2021-01-28 DEATH — deceased
